# Patient Record
Sex: Female | Born: 1970 | Race: Black or African American | Hispanic: No | Marital: Married | Smoking: Never smoker
Health system: Southern US, Community
[De-identification: ages and names within clinical notes are randomized; demographics above are authoritative.]

## PROBLEM LIST (undated history)

## (undated) DIAGNOSIS — F32A Depression, unspecified: Secondary | ICD-10-CM

## (undated) DIAGNOSIS — Z973 Presence of spectacles and contact lenses: Secondary | ICD-10-CM

## (undated) DIAGNOSIS — F419 Anxiety disorder, unspecified: Secondary | ICD-10-CM

## (undated) DIAGNOSIS — F329 Major depressive disorder, single episode, unspecified: Secondary | ICD-10-CM

## (undated) DIAGNOSIS — E538 Deficiency of other specified B group vitamins: Secondary | ICD-10-CM

## (undated) DIAGNOSIS — Z8669 Personal history of other diseases of the nervous system and sense organs: Secondary | ICD-10-CM

## (undated) DIAGNOSIS — T8859XA Other complications of anesthesia, initial encounter: Secondary | ICD-10-CM

## (undated) DIAGNOSIS — T4145XA Adverse effect of unspecified anesthetic, initial encounter: Secondary | ICD-10-CM

## (undated) DIAGNOSIS — Z972 Presence of dental prosthetic device (complete) (partial): Secondary | ICD-10-CM

## (undated) DIAGNOSIS — T884XXA Failed or difficult intubation, initial encounter: Secondary | ICD-10-CM

## (undated) DIAGNOSIS — Z8489 Family history of other specified conditions: Secondary | ICD-10-CM

## (undated) DIAGNOSIS — E559 Vitamin D deficiency, unspecified: Secondary | ICD-10-CM

## (undated) DIAGNOSIS — R519 Headache, unspecified: Secondary | ICD-10-CM

## (undated) DIAGNOSIS — D649 Anemia, unspecified: Secondary | ICD-10-CM

## (undated) HISTORY — DX: Depression, unspecified: F32.A

## (undated) HISTORY — DX: Vitamin D deficiency, unspecified: E55.9

## (undated) HISTORY — DX: Anxiety disorder, unspecified: F41.9

## (undated) HISTORY — PX: BRAIN SURGERY: SHX531

## (undated) HISTORY — DX: Deficiency of other specified B group vitamins: E53.8

## (undated) HISTORY — DX: Major depressive disorder, single episode, unspecified: F32.9

## (undated) HISTORY — DX: Anemia, unspecified: D64.9

---
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
## 2000-09-20 HISTORY — PX: TUBAL LIGATION: SHX77

## 2004-11-05 DIAGNOSIS — F419 Anxiety disorder, unspecified: Secondary | ICD-10-CM | POA: Insufficient documentation

## 2008-04-18 DIAGNOSIS — G4700 Insomnia, unspecified: Secondary | ICD-10-CM | POA: Insufficient documentation

## 2009-05-05 ENCOUNTER — Ambulatory Visit: Payer: Self-pay | Admitting: Family Medicine

## 2009-05-16 DIAGNOSIS — D509 Iron deficiency anemia, unspecified: Secondary | ICD-10-CM | POA: Insufficient documentation

## 2009-09-20 DIAGNOSIS — Z8669 Personal history of other diseases of the nervous system and sense organs: Secondary | ICD-10-CM

## 2009-09-20 HISTORY — PX: BRAIN SURGERY: SHX531

## 2009-09-20 HISTORY — DX: Personal history of other diseases of the nervous system and sense organs: Z86.69

## 2009-10-13 DIAGNOSIS — E871 Hypo-osmolality and hyponatremia: Secondary | ICD-10-CM | POA: Insufficient documentation

## 2009-10-22 ENCOUNTER — Ambulatory Visit: Payer: Self-pay

## 2010-05-14 ENCOUNTER — Ambulatory Visit: Payer: Self-pay | Admitting: Family Medicine

## 2010-05-18 ENCOUNTER — Ambulatory Visit: Payer: Self-pay

## 2010-05-27 ENCOUNTER — Ambulatory Visit: Payer: Self-pay | Admitting: Family Medicine

## 2010-11-26 ENCOUNTER — Ambulatory Visit: Payer: Self-pay | Admitting: Family Medicine

## 2011-05-17 ENCOUNTER — Emergency Department: Payer: Self-pay | Admitting: Emergency Medicine

## 2011-06-10 ENCOUNTER — Ambulatory Visit: Payer: Self-pay | Admitting: Family Medicine

## 2011-07-08 ENCOUNTER — Ambulatory Visit: Payer: Self-pay | Admitting: Gastroenterology

## 2011-07-08 LAB — HM COLONOSCOPY

## 2012-07-19 DIAGNOSIS — M797 Fibromyalgia: Secondary | ICD-10-CM | POA: Insufficient documentation

## 2012-09-20 HISTORY — PX: COLONOSCOPY: SHX174

## 2013-01-16 ENCOUNTER — Ambulatory Visit: Payer: Self-pay | Admitting: Family Medicine

## 2013-08-10 LAB — HM PAP SMEAR: HM PAP: NEGATIVE

## 2013-08-30 ENCOUNTER — Ambulatory Visit: Payer: Self-pay | Admitting: Family Medicine

## 2013-09-25 DIAGNOSIS — L299 Pruritus, unspecified: Secondary | ICD-10-CM | POA: Diagnosis not present

## 2013-09-25 DIAGNOSIS — R21 Rash and other nonspecific skin eruption: Secondary | ICD-10-CM | POA: Diagnosis not present

## 2013-09-25 DIAGNOSIS — L258 Unspecified contact dermatitis due to other agents: Secondary | ICD-10-CM | POA: Diagnosis not present

## 2013-09-25 DIAGNOSIS — B359 Dermatophytosis, unspecified: Secondary | ICD-10-CM | POA: Diagnosis not present

## 2013-09-25 DIAGNOSIS — L259 Unspecified contact dermatitis, unspecified cause: Secondary | ICD-10-CM | POA: Diagnosis not present

## 2013-09-25 DIAGNOSIS — R209 Unspecified disturbances of skin sensation: Secondary | ICD-10-CM | POA: Diagnosis not present

## 2013-10-02 DIAGNOSIS — L259 Unspecified contact dermatitis, unspecified cause: Secondary | ICD-10-CM | POA: Diagnosis not present

## 2013-10-15 DIAGNOSIS — G4720 Circadian rhythm sleep disorder, unspecified type: Secondary | ICD-10-CM | POA: Diagnosis not present

## 2013-10-15 DIAGNOSIS — G43719 Chronic migraine without aura, intractable, without status migrainosus: Secondary | ICD-10-CM | POA: Diagnosis not present

## 2013-10-15 DIAGNOSIS — G935 Compression of brain: Secondary | ICD-10-CM | POA: Diagnosis not present

## 2013-10-15 DIAGNOSIS — G4700 Insomnia, unspecified: Secondary | ICD-10-CM | POA: Diagnosis not present

## 2013-10-29 DIAGNOSIS — L819 Disorder of pigmentation, unspecified: Secondary | ICD-10-CM | POA: Diagnosis not present

## 2013-10-29 DIAGNOSIS — R21 Rash and other nonspecific skin eruption: Secondary | ICD-10-CM | POA: Diagnosis not present

## 2013-10-29 DIAGNOSIS — L259 Unspecified contact dermatitis, unspecified cause: Secondary | ICD-10-CM | POA: Diagnosis not present

## 2013-10-29 DIAGNOSIS — L280 Lichen simplex chronicus: Secondary | ICD-10-CM | POA: Diagnosis not present

## 2013-11-13 DIAGNOSIS — F3289 Other specified depressive episodes: Secondary | ICD-10-CM | POA: Diagnosis not present

## 2013-11-13 DIAGNOSIS — R05 Cough: Secondary | ICD-10-CM | POA: Diagnosis not present

## 2013-11-13 DIAGNOSIS — R059 Cough, unspecified: Secondary | ICD-10-CM | POA: Diagnosis not present

## 2013-11-13 DIAGNOSIS — F329 Major depressive disorder, single episode, unspecified: Secondary | ICD-10-CM | POA: Diagnosis not present

## 2013-11-13 DIAGNOSIS — J069 Acute upper respiratory infection, unspecified: Secondary | ICD-10-CM | POA: Diagnosis not present

## 2013-11-13 DIAGNOSIS — F411 Generalized anxiety disorder: Secondary | ICD-10-CM | POA: Diagnosis not present

## 2014-01-30 DIAGNOSIS — F3289 Other specified depressive episodes: Secondary | ICD-10-CM | POA: Diagnosis not present

## 2014-01-30 DIAGNOSIS — F329 Major depressive disorder, single episode, unspecified: Secondary | ICD-10-CM | POA: Diagnosis not present

## 2014-01-30 DIAGNOSIS — Q054 Unspecified spina bifida with hydrocephalus: Secondary | ICD-10-CM | POA: Diagnosis not present

## 2014-01-30 DIAGNOSIS — R51 Headache: Secondary | ICD-10-CM | POA: Diagnosis not present

## 2014-01-30 DIAGNOSIS — IMO0001 Reserved for inherently not codable concepts without codable children: Secondary | ICD-10-CM | POA: Diagnosis not present

## 2014-04-26 DIAGNOSIS — Q223 Other congenital malformations of pulmonary valve: Secondary | ICD-10-CM | POA: Diagnosis not present

## 2014-04-26 DIAGNOSIS — F3289 Other specified depressive episodes: Secondary | ICD-10-CM | POA: Diagnosis not present

## 2014-04-26 DIAGNOSIS — R51 Headache: Secondary | ICD-10-CM | POA: Diagnosis not present

## 2014-04-26 DIAGNOSIS — F329 Major depressive disorder, single episode, unspecified: Secondary | ICD-10-CM | POA: Diagnosis not present

## 2014-04-26 DIAGNOSIS — IMO0001 Reserved for inherently not codable concepts without codable children: Secondary | ICD-10-CM | POA: Diagnosis not present

## 2014-05-21 DIAGNOSIS — G935 Compression of brain: Secondary | ICD-10-CM | POA: Diagnosis not present

## 2014-05-21 DIAGNOSIS — M542 Cervicalgia: Secondary | ICD-10-CM | POA: Diagnosis not present

## 2014-05-21 DIAGNOSIS — G243 Spasmodic torticollis: Secondary | ICD-10-CM | POA: Diagnosis not present

## 2014-05-21 DIAGNOSIS — G43719 Chronic migraine without aura, intractable, without status migrainosus: Secondary | ICD-10-CM | POA: Diagnosis not present

## 2014-07-24 DIAGNOSIS — L239 Allergic contact dermatitis, unspecified cause: Secondary | ICD-10-CM | POA: Diagnosis not present

## 2014-07-24 DIAGNOSIS — R21 Rash and other nonspecific skin eruption: Secondary | ICD-10-CM | POA: Diagnosis not present

## 2014-07-24 DIAGNOSIS — L819 Disorder of pigmentation, unspecified: Secondary | ICD-10-CM | POA: Diagnosis not present

## 2014-08-16 DIAGNOSIS — M542 Cervicalgia: Secondary | ICD-10-CM | POA: Diagnosis not present

## 2014-08-16 DIAGNOSIS — Z0000 Encounter for general adult medical examination without abnormal findings: Secondary | ICD-10-CM | POA: Diagnosis not present

## 2014-08-16 DIAGNOSIS — Z1389 Encounter for screening for other disorder: Secondary | ICD-10-CM | POA: Diagnosis not present

## 2014-08-16 DIAGNOSIS — G4730 Sleep apnea, unspecified: Secondary | ICD-10-CM | POA: Diagnosis not present

## 2014-08-20 DIAGNOSIS — M542 Cervicalgia: Secondary | ICD-10-CM | POA: Diagnosis not present

## 2014-09-02 DIAGNOSIS — M542 Cervicalgia: Secondary | ICD-10-CM | POA: Diagnosis not present

## 2014-09-03 DIAGNOSIS — H04123 Dry eye syndrome of bilateral lacrimal glands: Secondary | ICD-10-CM | POA: Diagnosis not present

## 2014-09-03 DIAGNOSIS — H10413 Chronic giant papillary conjunctivitis, bilateral: Secondary | ICD-10-CM | POA: Diagnosis not present

## 2014-09-25 ENCOUNTER — Ambulatory Visit: Payer: Self-pay | Admitting: Family Medicine

## 2014-09-25 DIAGNOSIS — Z1231 Encounter for screening mammogram for malignant neoplasm of breast: Secondary | ICD-10-CM | POA: Diagnosis not present

## 2014-09-25 LAB — HM MAMMOGRAPHY: HM Mammogram: NEGATIVE

## 2014-10-24 DIAGNOSIS — D509 Iron deficiency anemia, unspecified: Secondary | ICD-10-CM | POA: Diagnosis not present

## 2014-10-24 DIAGNOSIS — E538 Deficiency of other specified B group vitamins: Secondary | ICD-10-CM | POA: Diagnosis not present

## 2014-10-24 DIAGNOSIS — E559 Vitamin D deficiency, unspecified: Secondary | ICD-10-CM | POA: Diagnosis not present

## 2014-10-24 LAB — CBC AND DIFFERENTIAL
HCT: 38 % (ref 36–46)
HEMOGLOBIN: 11.9 g/dL — AB (ref 12.0–16.0)
Platelets: 328 10*3/uL (ref 150–399)
WBC: 5.1 10^3/mL

## 2014-10-24 LAB — HEMOGLOBIN A1C: HEMOGLOBIN A1C: 5.6 % (ref 4.0–6.0)

## 2014-12-24 DIAGNOSIS — G4723 Circadian rhythm sleep disorder, irregular sleep wake type: Secondary | ICD-10-CM | POA: Diagnosis not present

## 2014-12-24 DIAGNOSIS — G43719 Chronic migraine without aura, intractable, without status migrainosus: Secondary | ICD-10-CM | POA: Diagnosis not present

## 2014-12-24 DIAGNOSIS — G935 Compression of brain: Secondary | ICD-10-CM | POA: Diagnosis not present

## 2014-12-24 DIAGNOSIS — G4701 Insomnia due to medical condition: Secondary | ICD-10-CM | POA: Diagnosis not present

## 2015-01-09 ENCOUNTER — Ambulatory Visit
Admit: 2015-01-09 | Disposition: A | Discharge status: Home / Self Care | Payer: Self-pay | Attending: Family Medicine | Admitting: Family Medicine

## 2015-01-09 DIAGNOSIS — M797 Fibromyalgia: Secondary | ICD-10-CM | POA: Diagnosis not present

## 2015-01-09 DIAGNOSIS — J329 Chronic sinusitis, unspecified: Secondary | ICD-10-CM | POA: Diagnosis not present

## 2015-01-09 DIAGNOSIS — Z79899 Other long term (current) drug therapy: Secondary | ICD-10-CM | POA: Diagnosis not present

## 2015-01-09 DIAGNOSIS — J069 Acute upper respiratory infection, unspecified: Secondary | ICD-10-CM | POA: Diagnosis not present

## 2015-01-09 LAB — RAPID INFLUENZA A&B ANTIGENS

## 2015-01-13 DIAGNOSIS — Z1389 Encounter for screening for other disorder: Secondary | ICD-10-CM | POA: Diagnosis not present

## 2015-01-13 DIAGNOSIS — R05 Cough: Secondary | ICD-10-CM | POA: Diagnosis not present

## 2015-01-13 DIAGNOSIS — J309 Allergic rhinitis, unspecified: Secondary | ICD-10-CM | POA: Diagnosis not present

## 2015-01-13 DIAGNOSIS — J40 Bronchitis, not specified as acute or chronic: Secondary | ICD-10-CM | POA: Diagnosis not present

## 2015-05-08 ENCOUNTER — Other Ambulatory Visit: Payer: Self-pay | Admitting: *Deleted

## 2015-05-13 ENCOUNTER — Other Ambulatory Visit: Payer: Self-pay

## 2015-05-13 DIAGNOSIS — F329 Major depressive disorder, single episode, unspecified: Secondary | ICD-10-CM | POA: Insufficient documentation

## 2015-05-13 DIAGNOSIS — F32A Depression, unspecified: Secondary | ICD-10-CM

## 2015-05-20 ENCOUNTER — Other Ambulatory Visit: Payer: Self-pay

## 2015-05-20 DIAGNOSIS — M791 Myalgia, unspecified site: Secondary | ICD-10-CM | POA: Insufficient documentation

## 2015-05-20 DIAGNOSIS — Q0700 Arnold-Chiari syndrome without spina bifida or hydrocephalus: Secondary | ICD-10-CM | POA: Insufficient documentation

## 2015-05-20 DIAGNOSIS — G4730 Sleep apnea, unspecified: Secondary | ICD-10-CM | POA: Insufficient documentation

## 2015-05-20 DIAGNOSIS — A6920 Lyme disease, unspecified: Secondary | ICD-10-CM | POA: Insufficient documentation

## 2015-05-20 DIAGNOSIS — R519 Headache, unspecified: Secondary | ICD-10-CM | POA: Insufficient documentation

## 2015-05-20 DIAGNOSIS — R51 Headache: Secondary | ICD-10-CM

## 2015-05-20 DIAGNOSIS — L309 Dermatitis, unspecified: Secondary | ICD-10-CM | POA: Insufficient documentation

## 2015-05-20 DIAGNOSIS — J309 Allergic rhinitis, unspecified: Secondary | ICD-10-CM | POA: Insufficient documentation

## 2015-05-20 DIAGNOSIS — E538 Deficiency of other specified B group vitamins: Secondary | ICD-10-CM | POA: Insufficient documentation

## 2015-05-20 DIAGNOSIS — F32A Depression, unspecified: Secondary | ICD-10-CM

## 2015-05-20 DIAGNOSIS — G8929 Other chronic pain: Secondary | ICD-10-CM | POA: Insufficient documentation

## 2015-05-20 DIAGNOSIS — E559 Vitamin D deficiency, unspecified: Secondary | ICD-10-CM | POA: Insufficient documentation

## 2015-05-20 DIAGNOSIS — F329 Major depressive disorder, single episode, unspecified: Secondary | ICD-10-CM | POA: Insufficient documentation

## 2015-05-20 MED ORDER — CITALOPRAM HYDROBROMIDE 40 MG PO TABS: 40.0000 mg | ORAL_TABLET | Freq: Every day | ORAL | Status: DC

## 2015-06-29 ENCOUNTER — Encounter: Payer: Self-pay | Admitting: *Deleted

## 2015-06-29 ENCOUNTER — Ambulatory Visit
Admission: EM | Admit: 2015-06-29 | Discharge: 2015-06-29 | Disposition: A | Discharge status: Home / Self Care | Payer: Federal, State, Local not specified - PPO | Attending: Family Medicine | Admitting: Family Medicine

## 2015-06-29 ENCOUNTER — Ambulatory Visit: Payer: Federal, State, Local not specified - PPO

## 2015-06-29 DIAGNOSIS — S93401A Sprain of unspecified ligament of right ankle, initial encounter: Secondary | ICD-10-CM | POA: Diagnosis not present

## 2015-06-29 DIAGNOSIS — S0081XA Abrasion of other part of head, initial encounter: Secondary | ICD-10-CM | POA: Diagnosis not present

## 2015-06-29 DIAGNOSIS — M7989 Other specified soft tissue disorders: Secondary | ICD-10-CM | POA: Diagnosis not present

## 2015-06-29 MED ORDER — IBUPROFEN 600 MG PO TABS: 600.0000 mg | ORAL_TABLET | Freq: Four times a day (QID) | ORAL | Status: DC | PRN

## 2015-06-29 NOTE — Discharge Instructions (Signed)
Apply a compressive ACE bandage. Rest and elevate the affected painful area.  Apply cold compresses intermittently as needed for 44min at least 4 times daily.  As pain recedes, begin normal activities slowly as tolerated.   Use bactroban or neosporin as needed for scratch on face.  Keep clean & dry with soap & water. Ankle Sprain An ankle sprain is an injury to the strong, fibrous tissues (ligaments) that hold the bones of your ankle joint together.  CAUSES An ankle sprain is usually caused by a fall or by twisting your ankle. Ankle sprains most commonly occur when you step on the outer edge of your foot, and your ankle turns inward. People who participate in sports are more prone to these types of injuries.  SYMPTOMS   Pain in your ankle. The pain may be present at rest or only when you are trying to stand or walk.  Swelling.  Bruising. Bruising may develop immediately or within 1 to 2 days after your injury.  Difficulty standing or walking, particularly when turning corners or changing directions. DIAGNOSIS  Your caregiver will ask you details about your injury and perform a physical exam of your ankle to determine if you have an ankle sprain. During the physical exam, your caregiver will press on and apply pressure to specific areas of your foot and ankle. Your caregiver will try to move your ankle in certain ways. An X-ray exam may be done to be sure a bone was not broken or a ligament did not separate from one of the bones in your ankle (avulsion fracture).  TREATMENT  Certain types of braces can help stabilize your ankle. Your caregiver can make a recommendation for this. Your caregiver may recommend the use of medicine for pain. If your sprain is severe, your caregiver may refer you to a surgeon who helps to restore function to parts of your skeletal system (orthopedist) or a physical therapist. [place] ice to your injury for 1-2 days or as directed by your  caregiver. Applying ice helps to reduce inflammation and pain.  Put ice in a plastic bag.  Place a towel between your skin and the bag.  Leave the ice on for 15-20 minutes at a time, every 2 hours while you are awake.  Only take over-the-counter or prescription medicines for pain, discomfort, or fever as directed by your caregiver.  Elevate your injured ankle above the level of your heart as much as possible for 2-3 days.  If your caregiver recommends crutches, use them as instructed. Gradually put weight on the affected ankle. Continue to use crutches or a cane until you can walk without feeling pain in your ankle.  If you have a plaster splint, wear the splint as directed by your caregiver. Do not rest it on anything harder than a pillow for the first 24 hours. Do not put weight on it. Do not get it wet. You may take it off to take a shower or bath.  You may have been given an elastic bandage to wear around your ankle to provide support. If the elastic bandage is too tight (you have numbness or tingling in your foot or your foot becomes cold and blue), adjust the bandage to make it comfortable.  If you have an air splint, you may blow more air into it or let air out to make it more comfortable. You may take your splint off at night and before taking a shower or bath. Wiggle your  toes in the splint several times per day to decrease swelling. SEEK MEDICAL CARE IF:   You have rapidly increasing bruising or swelling.  Your toes feel extremely cold or you lose feeling in your foot.  Your pain is not relieved with medicine. SEEK IMMEDIATE MEDICAL CARE IF:  Your toes are numb or blue.  You have severe pain that is increasing. MAKE SURE YOU:   Understand these instructions.  Will watch your condition.  Will get help right away if you are not doing well or get worse.   This information is not intended to replace advice given to you by your health care provider. Make sure you discuss  any questions you have with your health care provider.   Document Released: 09/06/2005 Document Revised: 09/27/2014 Document Reviewed: 09/18/2011 Elsevier Interactive Patient Education 2016 [employer]  Abrasion An abrasion is a cut or scrape on the outer surface of your skin. An abrasion does not extend through all of the layers of your skin. It is important to care for your abrasion properly to prevent infection. CAUSES Most abrasions are caused by falling on or gliding across the ground or another surface. When your skin rubs on something, the outer and inner layer of skin rubs off.  SYMPTOMS A cut or scrape is the main symptom of this condition. The scrape may be bleeding, or it may appear red or pink. If there was an associated fall, there may be an underlying bruise. DIAGNOSIS An abrasion is diagnosed with a physical exam. TREATMENT Treatment for this condition depends on how large and deep the abrasion is. Usually, your abrasion will be cleaned with water and mild soap. This removes any dirt or debris that may be stuck. An antibiotic ointment may be applied to the abrasion to help prevent infection. A bandage (dressing) may be placed on the abrasion to keep it clean. You may also need a tetanus shot. HOME CARE INSTRUCTIONS Medicines  Take or apply medicines only as directed by your health care provider.  If you were prescribed an antibiotic ointment, finish all of it even if you start to feel better. Wound Care  Clean the wound with mild soap and water 2-3 times per day or as directed by your health care provider. Pat your wound dry with a clean towel. Do not rub it.  There are many different ways to close and cover a wound. Follow instructions from your health care provider about:  Wound care.  Dressing changes and removal.  Check your wound every day for signs of infection. Watch for:  Redness, swelling, or pain.  Fluid, blood, or pus. General Instructions  Keep the  dressing dry as directed by your health care provider. Do not take baths, swim, use a hot tub, or do anything that would put your wound underwater until your health care provider approves.  If there is swelling, raise (elevate) the injured area above the level of your heart while you are sitting or lying down.  Keep all follow-up visits as directed by your health care provider. This is important. SEEK MEDICAL CARE IF:  You received a tetanus shot and you have swelling, severe pain, redness, or bleeding at the injection site.  Your pain is not controlled with medicine.  You have increased redness, swelling, or pain at the site of your wound. SEEK IMMEDIATE MEDICAL CARE IF:  You have a red streak going away from your wound.  You have a fever.  You have fluid, blood, or pus  coming from your wound.  You notice a bad smell coming from your wound or your dressing.   This information is not intended to replace advice given to you by your health care provider. Make sure you discuss any questions you have with your health care provider.   Document Released: 06/16/2005 Document Revised: 05/28/2015 Document Reviewed: 09/04/2014 Elsevier Interactive Patient Education [employer].
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
## 2015-06-29 NOTE — ED Notes (Signed)
Pt states that she fell and slipped injuring her right ankle.

## 2015-07-25 DIAGNOSIS — L249 Irritant contact dermatitis, unspecified cause: Secondary | ICD-10-CM | POA: Diagnosis not present

## 2015-07-25 DIAGNOSIS — R21 Rash and other nonspecific skin eruption: Secondary | ICD-10-CM | POA: Diagnosis not present

## 2015-09-01 ENCOUNTER — Encounter: Payer: Self-pay | Admitting: Physician Assistant

## 2015-09-04 ENCOUNTER — Other Ambulatory Visit: Payer: Self-pay | Admitting: Family Medicine

## 2015-09-04 DIAGNOSIS — F419 Anxiety disorder, unspecified: Secondary | ICD-10-CM

## 2015-09-04 NOTE — Telephone Encounter (Signed)
Printed, please fax or call in to pharmacy. Thank you.   

## 2015-09-12 ENCOUNTER — Encounter: Payer: Self-pay | Admitting: Physician Assistant

## 2015-09-19 ENCOUNTER — Encounter: Payer: Self-pay | Admitting: Physician Assistant

## 2015-10-13 ENCOUNTER — Encounter: Payer: Self-pay | Admitting: Physician Assistant

## 2015-11-04 ENCOUNTER — Ambulatory Visit (INDEPENDENT_AMBULATORY_CARE_PROVIDER_SITE_OTHER): Payer: Federal, State, Local not specified - PPO | Admitting: Physician Assistant

## 2015-11-04 ENCOUNTER — Encounter: Payer: Self-pay | Admitting: Physician Assistant

## 2015-11-04 VITALS — BP 102/64 | HR 88 | Temp 98.3°F | Resp 16 | Ht 65.0 in | Wt 204.0 lb

## 2015-11-04 DIAGNOSIS — Z13220 Encounter for screening for lipoid disorders: Secondary | ICD-10-CM | POA: Diagnosis not present

## 2015-11-04 DIAGNOSIS — F419 Anxiety disorder, unspecified: Secondary | ICD-10-CM

## 2015-11-04 DIAGNOSIS — Z1239 Encounter for other screening for malignant neoplasm of breast: Secondary | ICD-10-CM

## 2015-11-04 DIAGNOSIS — Z136 Encounter for screening for cardiovascular disorders: Secondary | ICD-10-CM | POA: Diagnosis not present

## 2015-11-04 DIAGNOSIS — Z124 Encounter for screening for malignant neoplasm of cervix: Secondary | ICD-10-CM | POA: Diagnosis not present

## 2015-11-04 DIAGNOSIS — Z0000 Encounter for general adult medical examination without abnormal findings: Secondary | ICD-10-CM | POA: Diagnosis not present

## 2015-11-04 DIAGNOSIS — R7309 Other abnormal glucose: Secondary | ICD-10-CM

## 2015-11-04 DIAGNOSIS — F329 Major depressive disorder, single episode, unspecified: Secondary | ICD-10-CM | POA: Diagnosis not present

## 2015-11-04 DIAGNOSIS — Z1211 Encounter for screening for malignant neoplasm of colon: Secondary | ICD-10-CM | POA: Diagnosis not present

## 2015-11-04 DIAGNOSIS — E559 Vitamin D deficiency, unspecified: Secondary | ICD-10-CM | POA: Diagnosis not present

## 2015-11-04 DIAGNOSIS — F32A Depression, unspecified: Secondary | ICD-10-CM

## 2015-11-04 MED ORDER — ALPRAZOLAM 0.5 MG PO TABS: ORAL_TABLET | ORAL | Status: DC

## 2015-11-04 MED ORDER — CITALOPRAM HYDROBROMIDE 40 MG PO TABS: 40.0000 mg | ORAL_TABLET | Freq: Every day | ORAL | Status: DC

## 2015-11-04 MED ORDER — VITAMIN D (ERGOCALCIFEROL) 1.25 MG (50000 UNIT) PO CAPS: 50000.0000 [IU] | ORAL_CAPSULE | ORAL | Status: DC

## 2015-11-04 NOTE — Progress Notes (Signed)
Patient ID: Virginia Woods, female   DOB: 12/12/1970, 45 y.o.   MRN: [id number]       Patient: Virginia Woods, Female    DOB: 1971-01-20, 45 y.o.   MRN: [id number] Visit Date: 11/04/2015  Today's Provider: Mar Daring, PA-C   Chief Complaint  Patient presents with  . Annual Exam   Subjective:    Annual physical exam Virginia Woods is a 45 y.o. female who presents today for health maintenance and complete physical. She feels fairly well. She reports exercising none. She reports she is sleeping fairly well.  08/16/14 CPE 08/10/13 Pap-neg 09/25/14 Mammo-BI-RADS 1 07/08/11 Colon-diverticulosis  Lab Results  Component Value Date   WBC 5.1 10/24/2014   HGB 11.9* 10/24/2014   HCT 38 10/24/2014   PLT 328 10/24/2014   HGBA1C 5.6 10/24/2014   -----------------------------------------------------------------   Review of Systems  Constitutional: Positive for fatigue.  HENT: Positive for congestion and sinus pressure.   Eyes: Positive for photophobia.  Respiratory: Negative.   Cardiovascular: Positive for palpitations.  Gastrointestinal: Negative.   Endocrine: Negative.   Genitourinary: Negative.   Musculoskeletal: Positive for myalgias, back pain, arthralgias, neck pain and neck stiffness.  Skin: Negative.   Allergic/Immunologic: Positive for environmental allergies.  Neurological: Positive for weakness, numbness and headaches.  Hematological: Negative.   Psychiatric/Behavioral: The patient is nervous/anxious.     Social History      She  reports that she has never smoked. She has never used smokeless tobacco. She reports that she does not drink alcohol or use illicit drugs.       Social History   Social History  . Marital Status: Married    Spouse Name: N/A  . Number of Children: N/A  . Years of Education: N/A   Social History Main Topics  . Smoking status: Never Smoker   . Smokeless tobacco: Never Used  . Alcohol Use: No  . Drug Use: No    . Sexual Activity: Not Asked   Other Topics Concern  . None   Social History Narrative    Past Medical History  Diagnosis Date  . Anemia   . Anxiety   . Depression   . Vitamin D deficiency   . Vitamin B12 deficiency      Patient Active Problem List   Diagnosis Date Noted  . Allergic rhinitis 05/20/2015  . 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 Chronic headache 05/20/2015  . Chronic pain 05/20/2015  . Clinical depression 05/20/2015  . Dermatitis, eczematoid 05/20/2015  . Acute Lyme disease 05/20/2015  . Muscle ache 05/20/2015  . Apnea, sleep 05/20/2015  . B12 deficiency 05/20/2015  . Avitaminosis D 05/20/2015  . Depression 05/13/2015  . Hypo-osmolality and hyponatremia 10/13/2009  . Anemia, iron deficiency 05/16/2009  . Cannot sleep 04/18/2008  . Anxiety 11/05/2004    Past Surgical History  Procedure Laterality Date  . Brain surgery    . Cesarean section  1996  . Tubal ligation  2002    Family History        Family Status  Relation Status Death Age  . Mother Alive   . Father Alive     Kidney failure, kidney transplant  . Sister Alive   . Paternal Aunt Alive     Brain aneurysm  . Maternal Grandmother Deceased     MI  . Paternal Grandmother Deceased     Brain aneurysm        Her family history includes Cancer in her mother; Heart disease  in her maternal grandmother; Hypertension in her mother and sister.    No Known Allergies  Previous Medications   ALBUTEROL (PROVENTIL HFA) 108 (90 BASE) MCG/ACT INHALER    Inhale into the lungs.   ALPRAZOLAM (XANAX) 0.5 MG TABLET    TAKE ONE TABLET BY MOUTH TWICE DAILY AS NEEDED.   CITALOPRAM (CELEXA) 40 MG TABLET    Take 1 tablet (40 mg total) by mouth daily.   CYCLOBENZAPRINE (FLEXERIL) 10 MG TABLET    Take 10 mg by mouth 3 (three) times daily as needed for muscle spasms.   FLUTICASONE (FLONASE) 50 MCG/ACT NASAL SPRAY    Place 2 sprays into the nose as needed.    GABAPENTIN (NEURONTIN) 300 MG CAPSULE     Take 300 mg by mouth 2 (two) times daily.    IBUPROFEN (ADVIL,MOTRIN) 600 MG TABLET    Take 1 tablet (600 mg total) by mouth every 6 (six) hours as needed.   INDOMETHACIN ER PO    Take by mouth as needed.    LORATADINE (CLARITIN) 10 MG TABLET    Take 10 mg by mouth daily as needed.    OMEGA-3 FATTY ACIDS PO       VITAMIN D, ERGOCALCIFEROL, (DRISDOL) 50000 UNITS CAPS CAPSULE    Take 50,000 Units by mouth every 7 (seven) days.     Patient Care Team: Mar Daring, PA-C as PCP - General (Family Medicine)     Objective:   Vitals: BP 102/64 mmHg  Pulse 88  Temp(Src) 98.3 F (36.8 C) (Oral)  Resp 16  Ht 5\' 5"  (1.651 m)  Wt 204 lb (92.534 kg)  BMI 33.95 kg/m2  LMP 10/13/2015   Physical Exam  Constitutional: She is oriented to person, place, and time. She appears well-developed and well-nourished. No distress.  HENT:  Head: Normocephalic and atraumatic.  Right Ear: Hearing, tympanic membrane, external ear and ear canal normal.  Left Ear: Hearing, tympanic membrane, external ear and ear canal normal.  Nose: Nose normal.  Mouth/Throat: Uvula is midline, oropharynx is clear and moist and mucous membranes are normal. No oropharyngeal exudate.  Eyes: Conjunctivae, EOM and lids are normal. Pupils are equal, round, and reactive to light. Right eye exhibits no discharge. Left eye exhibits no discharge. No scleral icterus.  Neck: Trachea normal and normal range of motion. Neck supple. No JVD present. Carotid bruit is not present. No tracheal deviation present. No thyroid mass and no thyromegaly present.  Cardiovascular: Normal rate, regular rhythm, normal heart sounds and intact distal pulses.  Exam reveals no gallop and no friction rub.   No murmur heard. Pulmonary/Chest: Effort normal and breath sounds normal. No respiratory distress. She has no wheezes. She has no rales. She exhibits no tenderness. Right breast exhibits no inverted nipple, no mass, no nipple discharge, no skin change and  no tenderness. Left breast exhibits no inverted nipple, no mass, no nipple discharge, no skin change and no tenderness. Breasts are symmetrical.  Abdominal: Soft. Normal appearance and bowel sounds are normal. She exhibits no distension and no mass. There is no hepatosplenomegaly. There is no tenderness. There is no rebound and no guarding. Hernia confirmed negative in the right inguinal area and confirmed negative in the left inguinal area.  Genitourinary: Rectum normal, vagina normal and uterus normal. No breast swelling, tenderness, discharge or bleeding. Pelvic exam was performed with patient supine. There is no rash, tenderness, lesion or injury on the right labia. There is no rash, tenderness, lesion or injury on  the left labia. Cervix exhibits no motion tenderness, no discharge and no friability. Right adnexum displays no mass, no tenderness and no fullness. Left adnexum displays no mass, no tenderness and no fullness. No erythema, tenderness or bleeding in the vagina. No signs of injury around the vagina. No vaginal discharge found.  Musculoskeletal: Normal range of motion. She exhibits no edema or tenderness.  Lymphadenopathy:    She has no cervical adenopathy.    She has no axillary adenopathy.       Right: No inguinal adenopathy present.       Left: No inguinal adenopathy present.  Neurological: She is alert and oriented to person, place, and time. She has normal strength and normal reflexes. No cranial nerve deficit. Coordination normal.  Skin: Skin is warm, dry and intact. No rash noted. She is not diaphoretic.  Psychiatric: She has a normal mood and affect. Her speech is normal and behavior is normal. Judgment and thought content normal. Cognition and memory are normal.  Vitals reviewed.    Depression Screen PHQ 2/9 Scores 11/04/2015  Exception Documentation Patient refusal      Assessment & Plan:     Routine Health Maintenance and Physical Exam  1. Annual physical  exam Physical exam was normal today. I will check labs as below and follow-up with her pending lab results. If labs are stable and within normal limits she would not need to be seen or have labs retested for one year at her next repeat annual physical exam. She may call the office if she develops any acute issues, questions or concerns in the meantime. - CBC with Differential - Comprehensive metabolic panel - TSH  2. Breast cancer screening Breast exam today was normal. There is no family history of breast cancer. She does perform self breast exams regularly. She is due for her mammogram. Mammogram was ordered as below. Information for [facility] breast clinic was given to patient so that she may call and schedule her mammogram at her convenience. - Mammogram Digital Screening; Future  3. Colon cancer screening It is time for her 5 year repeat colonoscopy. Referral to gastroenterology was made below for colonoscopy. - Ambulatory referral to Gastroenterology  4. Cervical cancer screening Pap smear was collected today. I will follow-up with her pending these results. - Pap IG w/ reflex to HPV when [facility] ([facility])  5. Elevated hemoglobin A1c History of borderline elevated hemoglobin A1c. We'll recheck hemoglobin A1c as below and follow-up pending these results. - Hemoglobin A1c  6. Encounter for lipid screening for cardiovascular disease Family history of elevated cholesterol. Will check cholesterol as below and follow-up with her pending these results. - Lipid panel  7. Depression Fairly stable. Diagnosis was pulled to refill medication below. Continue current medical treatment plan. She is to call the office if symptoms worsen. - citalopram (CELEXA) 40 MG tablet; Take 1 tablet (40 mg total) by mouth daily.  Dispense: 90 tablet; Refill: 3  8. Avitaminosis D Stable. Diagnoses pulled for medication refill. Continue current medical treatment plan. - Vitamin D, Ergocalciferol,  (DRISDOL) 50000 units CAPS capsule; Take 1 capsule (50,000 Units total) by mouth every 7 (seven) days.  Dispense: 4 capsule; Refill: 6  9. Anxiety Stable. Diagnoses pulled for medication refill. Continue current medical treatment plan. - ALPRAZolam (XANAX) 0.5 MG tablet; TAKE ONE TABLET BY MOUTH TWICE DAILY AS NEEDED.  Dispense: 40 tablet; Refill: 1   Exercise Activities and Dietary recommendations Goals    None  Immunization History  Administered Date(s) Administered  . Tdap 06/08/2010      --------------------------------------------------------------------

## 2015-11-04 NOTE — Patient Instructions (Signed)
Health Maintenance, Female Adopting a healthy lifestyle and getting preventive care can go a long way to promote health and wellness. Talk with your health care provider about what schedule of regular examinations is right for you. This is a good chance for you to check in with your provider about disease prevention and staying healthy. In between checkups, there are plenty of things you can do on your own. Experts have done a lot of research about which lifestyle changes and preventive measures are most likely to keep you healthy. Ask your health care provider for more information. WEIGHT AND DIET  Eat a healthy diet  Be sure to include plenty of vegetables, fruits, low-fat dairy products, and lean protein.  Do not eat a lot of foods high in solid fats, added sugars, or salt.  Get regular exercise. This is one of the most important things you can do for your health.  Most adults should exercise for at least 150 minutes each week. The exercise should increase your heart rate and make you sweat (moderate-intensity exercise).  Most adults should also do strengthening exercises at least twice a week. This is in addition to the moderate-intensity exercise.  Maintain a healthy weight  Body mass index (BMI) is a measurement that can be used to identify possible weight problems. It estimates body fat based on height and weight. Your health care provider can help determine your BMI and help you achieve or maintain a healthy weight.  For females 28 years of age and older:   A BMI below 18.5 is considered underweight.  A BMI of 18.5 to 24.9 is normal.  A BMI of 25 to 29.9 is considered overweight.  A BMI of 30 and above is considered obese.  Watch levels of cholesterol and blood lipids  You should start having your blood tested for lipids and cholesterol at 46 years of age, then have this test every 5 years.  You may need to have your cholesterol levels checked more often if:  Your lipid  or cholesterol levels are high.  You are older than 46 years of age.  You are at high risk for heart disease.  CANCER SCREENING   Lung Cancer  Lung cancer screening is recommended for adults 75-66 years old who are at high risk for lung cancer because of a history of smoking.  A yearly low-dose CT scan of the lungs is recommended for people who:  Currently smoke.  Have quit within the past 15 years.  Have at least a 30-pack-year history of smoking. A pack year is smoking an average of one pack of cigarettes a day for 1 year.  Yearly screening should continue until it has been 15 years since you quit.  Yearly screening should stop if you develop a health problem that would prevent you from having lung cancer treatment.  Breast Cancer  Practice breast self-awareness. This means understanding how your breasts normally appear and feel.  It also means doing regular breast self-exams. Let your health care provider know about any changes, no matter how small.  If you are in your 20s or 30s, you should have a clinical breast exam (CBE) by a health care provider every 1-3 years as part of a regular health exam.  If you are 25 or older, have a CBE every year. Also consider having a breast X-ray (mammogram) every year.  If you have a family history of breast cancer, talk to your health care provider about genetic screening.  If you  are at high risk for breast cancer, talk to your health care provider about having an MRI and a mammogram every year.  Breast cancer gene (BRCA) assessment is recommended for women who have family members with BRCA-related cancers. BRCA-related cancers include:  Breast.  Ovarian.  Tubal.  Peritoneal cancers.  Results of the assessment will determine the need for genetic counseling and BRCA1 and BRCA2 testing. Cervical Cancer Your health care provider may recommend that you be screened regularly for cancer of the pelvic organs (ovaries, uterus, and  vagina). This screening involves a pelvic examination, including checking for microscopic changes to the surface of your cervix (Pap test). You may be encouraged to have this screening done every 3 years, beginning at age 21.  For women ages 30-65, health care providers may recommend pelvic exams and Pap testing every 3 years, or they may recommend the Pap and pelvic exam, combined with testing for human papilloma virus (HPV), every 5 years. Some types of HPV increase your risk of cervical cancer. Testing for HPV may also be done on women of any age with unclear Pap test results.  Other health care providers may not recommend any screening for nonpregnant women who are considered low risk for pelvic cancer and who do not have symptoms. Ask your health care provider if a screening pelvic exam is right for you.  If you have had past treatment for cervical cancer or a condition that could lead to cancer, you need Pap tests and screening for cancer for at least 20 years after your treatment. If Pap tests have been discontinued, your risk factors (such as having a new sexual partner) need to be reassessed to determine if screening should resume. Some women have medical problems that increase the chance of getting cervical cancer. In these cases, your health care provider may recommend more frequent screening and Pap tests. Colorectal Cancer  This type of cancer can be detected and often prevented.  Routine colorectal cancer screening usually begins at 46 years of age and continues through 46 years of age.  Your health care provider may recommend screening at an earlier age if you have risk factors for colon cancer.  Your health care provider may also recommend using home test kits to check for hidden blood in the stool.  A small camera at the end of a tube can be used to examine your colon directly (sigmoidoscopy or colonoscopy). This is done to check for the earliest forms of colorectal  cancer.  Routine screening usually begins at age 50.  Direct examination of the colon should be repeated every 5-10 years through 46 years of age. However, you may need to be screened more often if early forms of precancerous polyps or small growths are found. Skin Cancer  Check your skin from head to toe regularly.  Tell your health care provider about any new moles or changes in moles, especially if there is a change in a mole's shape or color.  Also tell your health care provider if you have a mole that is larger than the size of a pencil eraser.  Always use sunscreen. Apply sunscreen liberally and repeatedly throughout the day.  Protect yourself by wearing long sleeves, pants, a wide-brimmed hat, and sunglasses whenever you are outside. HEART DISEASE, DIABETES, AND HIGH BLOOD PRESSURE   High blood pressure causes heart disease and increases the risk of stroke. High blood pressure is more likely to develop in:  People who have blood pressure in the high end   of the normal range (130-139/85-89 mm Hg).  People who are overweight or obese.  People who are African American.  If you are 38-23 years of age, have your blood pressure checked every 3-5 years. If you are 61 years of age or older, have your blood pressure checked every year. You should have your blood pressure measured twice--once when you are at a hospital or clinic, and once when you are not at a hospital or clinic. Record the average of the two measurements. To check your blood pressure when you are not at a hospital or clinic, you can use:  An automated blood pressure machine at a pharmacy.  A home blood pressure monitor.  If you are between 45 years and 39 years old, ask your health care provider if you should take aspirin to prevent strokes.  Have regular diabetes screenings. This involves taking a blood sample to check your fasting blood sugar level.  If you are at a normal weight and have a low risk for diabetes,  have this test once every three years after 46 years of age.  If you are overweight and have a high risk for diabetes, consider being tested at a younger age or more often. PREVENTING INFECTION  Hepatitis B  If you have a higher risk for hepatitis B, you should be screened for this virus. You are considered at high risk for hepatitis B if:  You were born in a country where hepatitis B is common. Ask your health care provider which countries are considered high risk.  Your parents were born in a high-risk country, and you have not been immunized against hepatitis B (hepatitis B vaccine).  You have HIV or AIDS.  You use needles to inject street drugs.  You live with someone who has hepatitis B.  You have had sex with someone who has hepatitis B.  You get hemodialysis treatment.  You take certain medicines for conditions, including cancer, organ transplantation, and autoimmune conditions. Hepatitis C  Blood testing is recommended for:  Everyone born from [birth year] through 1966.  Anyone with known risk factors for hepatitis C. Sexually transmitted infections (STIs)  You should be screened for sexually transmitted infections (STIs) including gonorrhea and chlamydia if:  You are sexually active and are younger than 46 years of age.  You are older than 46 years of age and your health care provider tells you that you are at risk for this type of infection.  Your sexual activity has changed since you were last screened and you are at an increased risk for chlamydia or gonorrhea. Ask your health care provider if you are at risk.  If you do not have HIV, but are at risk, it may be recommended that you take a prescription medicine daily to prevent HIV infection. This is called pre-exposure prophylaxis (PrEP). You are considered at risk if:  You are sexually active and do not regularly use condoms or know the HIV status of your partner(s).  You take drugs by injection.  You are sexually  active with a partner who has HIV. Talk with your health care provider about whether you are at high risk of being infected with HIV. If you choose to begin PrEP, you should first be tested for HIV. You should then be tested every 3 months for as long as you are taking PrEP.  PREGNANCY   If you are premenopausal and you may become pregnant, ask your health care provider about preconception counseling.  If you may  become pregnant, take 400 to 800 micrograms (mcg) of folic acid every day.  If you want to prevent pregnancy, talk to your health care provider about birth control (contraception). OSTEOPOROSIS AND MENOPAUSE   Osteoporosis is a disease in which the bones lose minerals and strength with aging. This can result in serious bone fractures. Your risk for osteoporosis can be identified using a bone density scan.  If you are 61 years of age or older, or if you are at risk for osteoporosis and fractures, ask your health care provider if you should be screened.  Ask your health care provider whether you should take a calcium or vitamin D supplement to lower your risk for osteoporosis.  Menopause may have certain physical symptoms and risks.  Hormone replacement therapy may reduce some of these symptoms and risks. Talk to your health care provider about whether hormone replacement therapy is right for you.  HOME CARE INSTRUCTIONS   Schedule regular health, dental, and eye exams.  Stay current with your immunizations.   Do not use any tobacco products including cigarettes, chewing tobacco, or electronic cigarettes.  If you are pregnant, do not drink alcohol.  If you are breastfeeding, limit how much and how often you drink alcohol.  Limit alcohol intake to no more than 1 drink per day for nonpregnant women. One drink equals 12 ounces of beer, 5 ounces of wine, or 1 ounces of hard liquor.  Do not use street drugs.  Do not share needles.  Ask your health care provider for help if  you need support or information about quitting drugs.  Tell your health care provider if you often feel depressed.  Tell your health care provider if you have ever been abused or do not feel safe at home.   This information is not intended to replace advice given to you by your health care provider. Make sure you discuss any questions you have with your health care provider.   Document Released: 03/22/2011 Document Revised: 09/27/2014 Document Reviewed: 08/08/2013 Elsevier Interactive Patient Education [employer].

## 2015-11-07 ENCOUNTER — Telehealth: Payer: Self-pay

## 2015-11-07 LAB — PAP IG W/ RFLX HPV ASCU: PAP SMEAR COMMENT: 0

## 2015-11-07 NOTE — Telephone Encounter (Signed)
No Answer  Thanks,  -Alisa Stjames

## 2015-11-07 NOTE — Telephone Encounter (Signed)
-----   Message from Mar Daring, PA-C sent at 11/07/2015  8:16 AM EST ----- Pap is normal.  Will repeat in 3 years.

## 2015-11-10 NOTE — Telephone Encounter (Signed)
Patient advised as directed below.  Thanks,  -Joseline 

## 2015-11-12 ENCOUNTER — Ambulatory Visit (INDEPENDENT_AMBULATORY_CARE_PROVIDER_SITE_OTHER): Payer: Federal, State, Local not specified - PPO | Admitting: Physician Assistant

## 2015-11-12 ENCOUNTER — Encounter: Payer: Self-pay | Admitting: Physician Assistant

## 2015-11-12 VITALS — BP 98/60 | HR 84 | Temp 98.9°F | Resp 16 | Wt 202.2 lb

## 2015-11-12 DIAGNOSIS — J029 Acute pharyngitis, unspecified: Secondary | ICD-10-CM

## 2015-11-12 DIAGNOSIS — J069 Acute upper respiratory infection, unspecified: Secondary | ICD-10-CM

## 2015-11-12 DIAGNOSIS — R509 Fever, unspecified: Secondary | ICD-10-CM | POA: Diagnosis not present

## 2015-11-12 LAB — POCT RAPID STREP A (OFFICE): RAPID STREP A SCREEN: NEGATIVE

## 2015-11-12 LAB — POCT INFLUENZA A/B
INFLUENZA B, POC: NEGATIVE
Influenza A, POC: NEGATIVE

## 2015-11-12 MED ORDER — AMOXICILLIN-POT CLAVULANATE 875-125 MG PO TABS: 1.0000 | ORAL_TABLET | Freq: Two times a day (BID) | ORAL | Status: DC

## 2015-11-12 NOTE — Progress Notes (Signed)
Patient: Virginia Woods Female    DOB: 26-Mar-1971   45 y.o.   MRN: [id number] Visit Date: 11/12/2015  Today's Provider: Mar Daring, PA-C   Chief Complaint  Patient presents with  . Fever   Subjective:    Fever  This is a new problem. The current episode started in the past 7 days (Fever strted on the weekend). The maximum temperature noted was 99 to 99.9 F (Last time patient noticed a low grade fever was Sunday.). The temperature was taken using an oral thermometer. Associated symptoms include chest pain (with coughing), congestion, coughing, headaches, muscle aches, sleepiness, a sore throat and wheezing. Pertinent negatives include no abdominal pain, ear pain, nausea or vomiting. She has tried fluids (Aleve.) for the symptoms. The treatment provided mild relief.  Husband has been sick as well. She states he was given an antibiotic and told he did not have the flu. She would like a flu test so she can tell her daughter she does not have the flu so she can see her grandson.     No Known Allergies Previous Medications   ALBUTEROL (PROVENTIL HFA) 108 (90 BASE) MCG/ACT INHALER    Inhale into the lungs.   ALPRAZOLAM (XANAX) 0.5 MG TABLET    TAKE ONE TABLET BY MOUTH TWICE DAILY AS NEEDED.   CITALOPRAM (CELEXA) 40 MG TABLET    Take 1 tablet (40 mg total) by mouth daily.   CYCLOBENZAPRINE (FLEXERIL) 10 MG TABLET    Take 10 mg by mouth 3 (three) times daily as needed for muscle spasms.   FLUTICASONE (FLONASE) 50 MCG/ACT NASAL SPRAY    Place 2 sprays into the nose as needed.    GABAPENTIN (NEURONTIN) 300 MG CAPSULE    Take 300 mg by mouth 2 (two) times daily.    IBUPROFEN (ADVIL,MOTRIN) 600 MG TABLET    Take 1 tablet (600 mg total) by mouth every 6 (six) hours as needed.   INDOMETHACIN ER PO    Take by mouth as needed.    LORATADINE (CLARITIN) 10 MG TABLET    Take 10 mg by mouth daily as needed.    OMEGA-3 FATTY ACIDS PO       VITAMIN D, ERGOCALCIFEROL, (DRISDOL) 50000  UNITS CAPS CAPSULE    Take 1 capsule (50,000 Units total) by mouth every 7 (seven) days.    Review of Systems  Constitutional: Positive for fever and fatigue (Just in the weekend). Negative for chills.  HENT: Positive for congestion, postnasal drip, sinus pressure, sneezing and sore throat. Negative for ear pain and rhinorrhea.   Respiratory: Positive for cough, chest tightness and wheezing.   Cardiovascular: Positive for chest pain (with coughing).  Gastrointestinal: Negative for nausea, vomiting and abdominal pain.  Neurological: Positive for headaches. Negative for dizziness.    Social History  Substance Use Topics  . Smoking status: Never Smoker   . Smokeless tobacco: Never Used  . Alcohol Use: No   Objective:   BP 98/60 mmHg  Pulse 84  Temp(Src) 98.9 F (37.2 C) (Oral)  Resp 16  Wt 202 lb 3.2 oz (91.717 kg)  LMP 11/08/2015  Physical Exam  Constitutional: She appears well-developed and well-nourished. No distress.  HENT:  Head: Normocephalic and atraumatic.  Right Ear: Hearing, tympanic membrane, external ear and ear canal normal. Tympanic membrane is not erythematous and not bulging. No middle ear effusion.  Left Ear: Hearing, external ear and ear canal normal. Tympanic membrane is not erythematous and  not bulging. A middle ear effusion is present.  Nose: Mucosal edema and rhinorrhea present. Right sinus exhibits no maxillary sinus tenderness and no frontal sinus tenderness. Left sinus exhibits maxillary sinus tenderness. Left sinus exhibits no frontal sinus tenderness.  Mouth/Throat: Uvula is midline and mucous membranes are normal. Posterior oropharyngeal erythema present. No oropharyngeal exudate or posterior oropharyngeal edema.  Eyes: Conjunctivae are normal. Pupils are equal, round, and reactive to light. Right eye exhibits no discharge. Left eye exhibits no discharge. No scleral icterus.  Neck: Normal range of motion. Neck supple. No tracheal deviation present. No  thyromegaly present.  Cardiovascular: Normal rate, regular rhythm and normal heart sounds.  Exam reveals no gallop and no friction rub.   No murmur heard. Pulmonary/Chest: Effort normal and breath sounds normal. No stridor. No respiratory distress. She has no wheezes. She has no rales. She exhibits tenderness (right and left sternal borders).  Lymphadenopathy:    She has no cervical adenopathy.  Skin: Skin is warm and dry. She is not diaphoretic.  Vitals reviewed.       Assessment & Plan:     1. Upper respiratory infection Worsening symptoms with fever. She has not responded to over-the-counter treatments. I will give Augmentin as below for upper respiratory infection and sinusitis type symptoms on the left. She may continue to take Mucinex DM as needed for congestion. She may take Tylenol or ibuprofen as needed for fever and pain. I do feel she is also starting to develop costochondritis secondary to her cough. I did advise her to continue the anti-inflammatories for that as well as using icy hot or BenGay over the areas that are tender. We discussed cough suppressants but she states she never uses cough suppressants and does not wish to do so. She needs to make sure to stay well-hydrated and get plenty of rest. She is to call the office if symptoms fail to improve or worsen. - amoxicillin-clavulanate (AUGMENTIN) 875-125 MG tablet; Take 1 tablet by mouth 2 (two) times daily.  Dispense: 20 tablet; Refill: 0  2. Fever, unspecified fever cause Flu test was negative today. - POCT Influenza A/B - POCT rapid strep A  3. Sore throat Rapid strep was negative today. - POCT Influenza A/B - POCT rapid strep A       Mar Daring, PA-C  [facility]

## 2015-11-12 NOTE — Patient Instructions (Signed)
Upper Respiratory Infection, Adult Most upper respiratory infections (URIs) are a viral infection of the air passages leading to the lungs. A URI affects the nose, throat, and upper air passages. The most common type of URI is nasopharyngitis and is typically referred to as "the common cold." URIs run their course and usually go away on their own. Most of the time, a URI does not require medical attention, but sometimes a bacterial infection in the upper airways can follow a viral infection. This is called a secondary infection. Sinus and middle ear infections are common types of secondary upper respiratory infections. Bacterial pneumonia can also complicate a URI. A URI can worsen asthma and chronic obstructive pulmonary disease (COPD). Sometimes, these complications can require emergency medical care and may be life threatening.  CAUSES Almost all URIs are caused by viruses. A virus is a type of germ and can spread from one person to another.  RISKS FACTORS You may be at risk for a URI if:   You smoke.   You have chronic heart or lung disease.  You have a weakened defense (immune) system.   You are very young or very old.   You have nasal allergies or asthma.  You work in crowded or poorly ventilated areas.  You work in health care facilities or schools. SIGNS AND SYMPTOMS  Symptoms typically develop 2-3 days after you come in contact with a cold virus. Most viral URIs last 7-10 days. However, viral URIs from the influenza virus (flu virus) can last 14-18 days and are typically more severe. Symptoms may include:   Runny or stuffy (congested) nose.   Sneezing.   Cough.   Sore throat.   Headache.   Fatigue.   Fever.   Loss of appetite.   Pain in your forehead, behind your eyes, and over your cheekbones (sinus pain).  Muscle aches.  DIAGNOSIS  Your health care provider may diagnose a URI by:  Physical exam.  Tests to check that your symptoms are not due to  another condition such as:  Strep throat.  Sinusitis.  Pneumonia.  Asthma. TREATMENT  A URI goes away on its own with time. It cannot be cured with medicines, but medicines may be prescribed or recommended to relieve symptoms. Medicines may help:  Reduce your fever.  Reduce your cough.  Relieve nasal congestion. HOME CARE INSTRUCTIONS   Take medicines only as directed by your health care provider.   Gargle warm saltwater or take cough drops to comfort your throat as directed by your health care provider.  Use a warm mist humidifier or inhale steam from a shower to increase air moisture. This may make it easier to breathe.  Drink enough fluid to keep your urine clear or pale yellow.   Eat soups and other clear broths and maintain good nutrition.   Rest as needed.   Return to work when your temperature has returned to normal or as your health care provider advises. You may need to stay home longer to avoid infecting others. You can also use a face mask and careful hand washing to prevent spread of the virus.  Increase the usage of your inhaler if you have asthma.   Do not use any tobacco products, including cigarettes, chewing tobacco, or electronic cigarettes. If you need help quitting, ask your health care provider. PREVENTION  The best way to protect yourself from getting a cold is to practice good hygiene.   Avoid oral or hand contact with people with cold   symptoms.   Wash your hands often if contact occurs.  There is no clear evidence that vitamin C, vitamin E, echinacea, or exercise reduces the chance of developing a cold. However, it is always recommended to get plenty of rest, exercise, and practice good nutrition.  SEEK MEDICAL CARE IF:   You are getting worse rather than better.   Your symptoms are not controlled by medicine.   You have chills.  You have worsening shortness of breath.  You have brown or red mucus.  You have yellow or brown nasal  discharge.  You have pain in your face, especially when you bend forward.  You have a fever.  You have swollen neck glands.  You have pain while swallowing.  You have white areas in the back of your throat. SEEK IMMEDIATE MEDICAL CARE IF:   You have severe or persistent:  Headache.  Ear pain.  Sinus pain.  Chest pain.  You have chronic lung disease and any of the following:  Wheezing.  Prolonged cough.  Coughing up blood.  A change in your usual mucus.  You have a stiff neck.  You have changes in your:  Vision.  Hearing.  Thinking.  Mood. MAKE SURE YOU:   Understand these instructions.  Will watch your condition.  Will get help right away if you are not doing well or get worse.   This information is not intended to replace advice given to you by your health care provider. Make sure you discuss any questions you have with your health care provider.   Document Released: 03/02/2001 Document Revised: 01/21/2015 Document Reviewed: 12/12/2013 Elsevier Interactive Patient Education 2016 [employer]  

## 2015-11-18 LAB — LIPID PANEL
Chol/HDL Ratio: 2.3 ratio units (ref 0.0–4.4)
Cholesterol, Total: 121 mg/dL (ref 100–199)
HDL: 52 mg/dL (ref >39)
LDL Calculated: 59 mg/dL (ref 0–99)
Triglycerides: 49 mg/dL (ref 0–149)
VLDL Cholesterol Cal: 10 mg/dL (ref 5–40)

## 2015-11-18 LAB — COMPREHENSIVE METABOLIC PANEL
A/G RATIO: 1.7 (ref 1.1–2.5)
ALBUMIN: 4.1 g/dL (ref 3.5–5.5)
ALT: 12 IU/L (ref 0–32)
AST: 16 IU/L (ref 0–40)
Alkaline Phosphatase: 69 IU/L (ref 39–117)
BILIRUBIN TOTAL: 0.2 mg/dL (ref 0.0–1.2)
BUN / CREAT RATIO: 8 — AB (ref 9–23)
BUN: 6 mg/dL (ref 6–24)
CO2: 25 mmol/L (ref 18–29)
Calcium: 9.1 mg/dL (ref 8.7–10.2)
Chloride: 102 mmol/L (ref 96–106)
Creatinine, Ser: 0.73 mg/dL (ref 0.57–1.00)
GFR, EST AFRICAN AMERICAN: 115 mL/min/{1.73_m2} (ref >59)
GFR, EST NON AFRICAN AMERICAN: 100 mL/min/{1.73_m2} (ref >59)
Globulin, Total: 2.4 g/dL (ref 1.5–4.5)
Glucose: 86 mg/dL (ref 65–99)
Potassium: 4.3 mmol/L (ref 3.5–5.2)
Sodium: 140 mmol/L (ref 134–144)
Total Protein: 6.5 g/dL (ref 6.0–8.5)

## 2015-11-18 LAB — HEMOGLOBIN A1C
Est. average glucose Bld gHb Est-mCnc: 120 mg/dL
HEMOGLOBIN A1C: 5.8 % — AB (ref 4.8–5.6)

## 2015-11-18 LAB — CBC WITH DIFFERENTIAL/PLATELET
BASOS: 1 %
Basophils Absolute: 0 10*3/uL (ref 0.0–0.2)
EOS (ABSOLUTE): 0.1 10*3/uL (ref 0.0–0.4)
Eos: 2 %
HEMOGLOBIN: 11.9 g/dL (ref 11.1–15.9)
Hematocrit: 37.7 % (ref 34.0–46.6)
Immature Grans (Abs): 0 10*3/uL (ref 0.0–0.1)
Immature Granulocytes: 1 %
Lymphocytes Absolute: 1.6 10*3/uL (ref 0.7–3.1)
Lymphs: 42 %
MCH: 23 pg — AB (ref 26.6–33.0)
MCHC: 31.6 g/dL (ref 31.5–35.7)
MCV: 73 fL — AB (ref 79–97)
MONOCYTES: 8 %
MONOS ABS: 0.3 10*3/uL (ref 0.1–0.9)
NEUTROS ABS: 1.8 10*3/uL (ref 1.4–7.0)
Neutrophils: 46 %
Platelets: 355 10*3/uL (ref 150–379)
RBC: 5.18 x10E6/uL (ref 3.77–5.28)
RDW: 14.5 % (ref 12.3–15.4)
WBC: 3.8 10*3/uL (ref 3.4–10.8)

## 2015-11-18 LAB — TSH: TSH: 0.977 u[IU]/mL (ref 0.450–4.500)

## 2016-01-04 ENCOUNTER — Emergency Department: Payer: Federal, State, Local not specified - PPO

## 2016-01-04 ENCOUNTER — Encounter: Payer: Self-pay | Admitting: Emergency Medicine

## 2016-01-04 ENCOUNTER — Emergency Department
Admission: EM | Admit: 2016-01-04 | Discharge: 2016-01-04 | Disposition: A | Discharge status: Home / Self Care | Payer: Federal, State, Local not specified - PPO | Attending: Emergency Medicine | Admitting: Emergency Medicine

## 2016-01-04 ENCOUNTER — Ambulatory Visit
Admission: EM | Admit: 2016-01-04 | Discharge: 2016-01-04 | Disposition: A | Discharge status: Other Acute Inpatient Hospital | Payer: Federal, State, Local not specified - PPO | Attending: Family Medicine | Admitting: Family Medicine

## 2016-01-04 DIAGNOSIS — Z7951 Long term (current) use of inhaled steroids: Secondary | ICD-10-CM | POA: Diagnosis not present

## 2016-01-04 DIAGNOSIS — F329 Major depressive disorder, single episode, unspecified: Secondary | ICD-10-CM | POA: Insufficient documentation

## 2016-01-04 DIAGNOSIS — Z792 Long term (current) use of antibiotics: Secondary | ICD-10-CM | POA: Diagnosis not present

## 2016-01-04 DIAGNOSIS — R079 Chest pain, unspecified: Secondary | ICD-10-CM | POA: Diagnosis not present

## 2016-01-04 DIAGNOSIS — M549 Dorsalgia, unspecified: Secondary | ICD-10-CM | POA: Insufficient documentation

## 2016-01-04 DIAGNOSIS — E871 Hypo-osmolality and hyponatremia: Secondary | ICD-10-CM | POA: Diagnosis not present

## 2016-01-04 DIAGNOSIS — R0789 Other chest pain: Secondary | ICD-10-CM | POA: Diagnosis not present

## 2016-01-04 DIAGNOSIS — E559 Vitamin D deficiency, unspecified: Secondary | ICD-10-CM | POA: Insufficient documentation

## 2016-01-04 DIAGNOSIS — F419 Anxiety disorder, unspecified: Secondary | ICD-10-CM | POA: Insufficient documentation

## 2016-01-04 DIAGNOSIS — R071 Chest pain on breathing: Secondary | ICD-10-CM | POA: Diagnosis not present

## 2016-01-04 DIAGNOSIS — R0781 Pleurodynia: Secondary | ICD-10-CM

## 2016-01-04 DIAGNOSIS — Z791 Long term (current) use of non-steroidal anti-inflammatories (NSAID): Secondary | ICD-10-CM | POA: Insufficient documentation

## 2016-01-04 LAB — COMPREHENSIVE METABOLIC PANEL
ALT: 15 U/L (ref 14–54)
ANION GAP: 8 (ref 5–15)
AST: 20 U/L (ref 15–41)
Albumin: 3.9 g/dL (ref 3.5–5.0)
Alkaline Phosphatase: 54 U/L (ref 38–126)
BILIRUBIN TOTAL: 0.4 mg/dL (ref 0.3–1.2)
BUN: 9 mg/dL (ref 6–20)
CO2: 24 mmol/L (ref 22–32)
Calcium: 8.9 mg/dL (ref 8.9–10.3)
Chloride: 105 mmol/L (ref 101–111)
Creatinine, Ser: 0.72 mg/dL (ref 0.44–1.00)
GFR calc Af Amer: >60 mL/min (ref >60)
Glucose, Bld: 83 mg/dL (ref 65–99)
POTASSIUM: 3.5 mmol/L (ref 3.5–5.1)
Sodium: 137 mmol/L (ref 135–145)
TOTAL PROTEIN: 6.9 g/dL (ref 6.5–8.1)

## 2016-01-04 LAB — CBC WITH DIFFERENTIAL/PLATELET
Basophils Absolute: 0 10*3/uL (ref 0–0.1)
Basophils Relative: 1 %
Eosinophils Absolute: 0 10*3/uL (ref 0–0.7)
Eosinophils Relative: 1 %
HEMATOCRIT: 36.3 % (ref 35.0–47.0)
Hemoglobin: 12 g/dL (ref 12.0–16.0)
Lymphocytes Relative: 35 %
Lymphs Abs: 1.7 10*3/uL (ref 1.0–3.6)
MCH: 24 pg — ABNORMAL LOW (ref 26.0–34.0)
MCHC: 33.1 g/dL (ref 32.0–36.0)
MCV: 72.5 fL — AB (ref 80.0–100.0)
MONO ABS: 0.4 10*3/uL (ref 0.2–0.9)
Monocytes Relative: 9 %
Neutro Abs: 2.7 10*3/uL (ref 1.4–6.5)
Neutrophils Relative %: 56 %
Platelets: 262 10*3/uL (ref 150–440)
RBC: 5.01 MIL/uL (ref 3.80–5.20)
RDW: 15.4 % — AB (ref 11.5–14.5)
WBC: 4.9 10*3/uL (ref 3.6–11.0)

## 2016-01-04 LAB — GLUCOSE, CAPILLARY: Glucose-Capillary: 62 mg/dL — ABNORMAL LOW (ref 65–99)

## 2016-01-04 LAB — TROPONIN I

## 2016-01-04 MED ORDER — HYDROMORPHONE HCL 2 MG PO TABS: 2.0000 mg | ORAL_TABLET | Freq: Two times a day (BID) | ORAL | Status: DC | PRN

## 2016-01-04 MED ORDER — LORAZEPAM 2 MG/ML IJ SOLN
1.0000 mg | Freq: Once | INTRAMUSCULAR | Status: DC
  Filled 2016-01-04: qty 1

## 2016-01-04 MED ORDER — HYDROMORPHONE HCL 1 MG/ML IJ SOLN
1.0000 mg | Freq: Once | INTRAMUSCULAR | Status: AC
  Administered 2016-01-04: 1 mg via INTRAVENOUS
  Filled 2016-01-04: qty 1

## 2016-01-04 MED ORDER — LORAZEPAM 1 MG PO TABS: 1.0000 mg | ORAL_TABLET | Freq: Two times a day (BID) | ORAL | Status: DC | PRN

## 2016-01-04 MED ORDER — IOPAMIDOL (ISOVUE-370) INJECTION 76%
75.0000 mL | Freq: Once | INTRAVENOUS | Status: AC | PRN
  Administered 2016-01-04: 75 mL via INTRAVENOUS

## 2016-01-04 MED ORDER — ONDANSETRON HCL 4 MG/2ML IJ SOLN
4.0000 mg | Freq: Once | INTRAMUSCULAR | Status: DC
  Filled 2016-01-04: qty 2

## 2016-01-04 NOTE — ED Provider Notes (Addendum)
CSN: [id number]     Arrival date & time 01/04/16  1249 History   First MD Initiated Contact with Patient 01/04/16 1309     Chief Complaint  Patient presents with  . Back Pain   (Consider location/radiation/quality/duration/timing/severity/associated sxs/prior Treatment) HPI: Patient presents today with symptoms of chest pain extending to the right shoulder and arm and also pain between her shoulder blades. Patient states that symptoms were there initially when she woke up but got worse after she got back from church today. Her husband did try to massage her back earlier today. She denies any injury or trauma to the area. She denies any URI symptoms, diaphoresis, headache. Patient states that she experiences the pain when she takes a deep breath. She does not have an increase in her symptoms with movement of her upper extremities. She denies any history of any cardiac problems. She denies any smoking history. She does state that she has had some burning pain in her right leg that she has experienced earlier this week. She denies any swelling of the lower extremities. She denies any history of DVT/PE. She did take one baby aspirin earlier today.  Past Medical History  Diagnosis Date  . Anemia   . Anxiety   . Depression   . Vitamin D deficiency   . Vitamin B12 deficiency    Past Surgical History  Procedure Laterality Date  . Brain surgery    . Cesarean section  1996  . Tubal ligation  2002   Family History  Problem Relation Age of Onset  . Hypertension Mother   . Cancer Mother     Laryngeal and lymphoma  . Hypertension Sister   . Heart disease Maternal Grandmother    Social History  Substance Use Topics  . Smoking status: Never Smoker   . Smokeless tobacco: Never Used  . Alcohol Use: No   OB History    No data available     Review of Systems: Negative except mentioned above.  Allergies  Review of patient's allergies indicates no known allergies.  Home Medications   Prior  to Admission medications   Medication Sig Start Date End Date Taking? Authorizing Provider  amoxicillin-clavulanate (AUGMENTIN) 875-125 MG tablet Take 1 tablet by mouth 2 (two) times daily. 11/12/15  Yes Clearnce Sorrel Burnette, PA-C  cyclobenzaprine (FLEXERIL) 10 MG tablet Take 10 mg by mouth 3 (three) times daily as needed for muscle spasms.   Yes Historical Provider, MD  fluticasone (FLONASE) 50 MCG/ACT nasal spray Place 2 sprays into the nose as needed.  07/10/14  Yes Historical Provider, MD  gabapentin (NEURONTIN) 300 MG capsule Take 300 mg by mouth 2 (two) times daily.  05/17/12  Yes Historical Provider, MD  ibuprofen (ADVIL,MOTRIN) 600 MG tablet Take 1 tablet (600 mg total) by mouth every 6 (six) hours as needed. 06/29/15  Yes Andria Meuse, NP  loratadine (CLARITIN) 10 MG tablet Take 10 mg by mouth daily as needed.    Yes Historical Provider, MD  OMEGA-3 FATTY ACIDS PO  05/01/09  Yes Historical Provider, MD  Vitamin D, Ergocalciferol, (DRISDOL) 50000 units CAPS capsule Take 1 capsule (50,000 Units total) by mouth every 7 (seven) days. 11/04/15  Yes Clearnce Sorrel Burnette, PA-C  albuterol (PROVENTIL HFA) 108 (90 BASE) MCG/ACT inhaler Inhale into the lungs. 01/13/15   Historical Provider, MD  ALPRAZolam Duanne Moron) 0.5 MG tablet TAKE ONE TABLET BY MOUTH TWICE DAILY AS NEEDED. 11/04/15   Mar Daring, PA-C  citalopram (CELEXA) 40 MG tablet  Take 1 tablet (40 mg total) by mouth daily. 11/04/15   Mar Daring, PA-C  INDOMETHACIN ER PO Take by mouth as needed.     Historical Provider, MD   Meds Ordered and Administered this Visit  Medications - No data to display  BP 122/84 mmHg  Pulse 80  Temp(Src) 97.9 F (36.6 C) (Tympanic)  Resp 20  Ht 5\' 5"  (1.651 m)  Wt 190 lb (86.183 kg)  BMI 31.62 kg/m2  SpO2 99%  LMP 01/04/2016 (Exact Date) No data found.   Physical Exam   GENERAL: mild discomfort  HEENT: no pharyngeal erythema, no exudate RESP: unable to fully take a deep breath without  experiencing the upper back pain otherwise clear, no wheezing  CARD: RRR ABD: +BS, NT MSK: no tenderness to palpation of back, full range of motion of upper extremities, nv intact, -Homans NEURO: CN II-XII grossly intact   ED Course  Procedures (including critical care time)  Labs Review Labs Reviewed  GLUCOSE, CAPILLARY - Abnormal; Notable for the following:    Glucose-Capillary 62 (*)    All other components within normal limits  CBG MONITORING, ED    Imaging Review No results found.  A/P: Chest pain, upper back pain-given patient's symptoms and would recommend further evaluation and treatment at the ER. Could possibly be musculoskeletal in etiology however would like to rule out cardiac/pulmonary source. Her EKG does show normal sinus rhythm with no ST elevations or depressions, with possible left atrial enlargement, I do agree with reading of the EKG printout, patient will likely need cardiac enzymes and d-dimer with imaging. IV Hep-Lock, O2 nasal cannula started. Three baby aspirin given to patient prior to EMS taking patient to the ER for further evaluation and treatment.    Paulina Fusi, MD 01/04/16 1337  Paulina Fusi, MD 01/04/16 [phone]

## 2016-01-04 NOTE — ED Notes (Signed)
Pt presents to ED from [facility] urgent care via EMS with c/o chest pain. Pt states sharp pain in between shoulder blades."hurts when I breath". Started after church and increased in discomfort.

## 2016-01-04 NOTE — ED Notes (Signed)
Patient states she woke with back pain which has gotten much worse, has pain between shoulders and in center of her chest

## 2016-01-04 NOTE — Discharge Instructions (Signed)
Chest Wall Pain Chest wall pain is pain in or around the bones and muscles of your chest. Sometimes, an injury causes this pain. Sometimes, the cause may not be known. This pain may take several weeks or longer to get better. HOME CARE INSTRUCTIONS  Pay attention to any changes in your symptoms. Take these actions to help with your pain:   Rest as told by your health care provider.   Avoid activities that cause pain. These include any activities that use your chest muscles or your abdominal and side muscles to lift heavy items.   If directed, apply ice to the painful area:  Put ice in a plastic bag.  Place a towel between your skin and the bag.  Leave the ice on for 20 minutes, 2-3 times per day.  Take over-the-counter and prescription medicines only as told by your health care provider.  Do not use tobacco products, including cigarettes, chewing tobacco, and e-cigarettes. If you need help quitting, ask your health care provider.  Keep all follow-up visits as told by your health care provider. This is important. SEEK MEDICAL CARE IF:  You have a fever.  Your chest pain becomes worse.  You have new symptoms. SEEK IMMEDIATE MEDICAL CARE IF:  You have nausea or vomiting.  You feel sweaty or light-headed.  You have a cough with phlegm (sputum) or you cough up blood.  You develop shortness of breath.   This information is not intended to replace advice given to you by your health care provider. Make sure you discuss any questions you have with your health care provider.   Document Released: 09/06/2005 Document Revised: 05/28/2015 Document Reviewed: 12/02/2014 Elsevier Interactive Patient Education [employer].  Please return immediately if condition worsens. Please contact her primary physician or the physician you were given for referral. If you have any specialist physicians involved in her treatment and plan please also contact them. Thank you for using [facility]. You can also take over-the-counter ibuprofen and Tylenol for pain.

## 2016-01-04 NOTE — ED Provider Notes (Signed)
Time Seen: Approximately 1430  I have reviewed the triage notes  Chief Complaint: Chest Pain   History of Present Illness: Virginia Woods is a 45 y.o. female *who presents after a fall from an urgent care center for evaluation of back and chest discomfort. Patient states that she started with some back pain this morning but thought that she slept wrong. She states as the day progressed and about 11 AM she was at church today and started having some anterior chest pain. She took 2 aspirin prior to arrival. She went to an urgent care center no performed an EKG but were concerned that her pain was pleuritic in nature would receive further evaluation here in the emergency department. Eyes any nausea, vomiting, fever. Pain is worse with movement and also deep inspiration. She denies any new leg pain or swelling on the posterior surface of her leg. 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 She denies any new weakness but has noticed some burning on the lateral surface of her right leg 3 days ago. She denies any left-sided arm or jaw pain. She states having menstrual periods otherwise denies any strong risk factors for cardiovascular disease.   Past Medical History  Diagnosis Date  . Anemia   . Anxiety   . Depression   . Vitamin D deficiency   . Vitamin B12 deficiency     Patient Active Problem List   Diagnosis Date Noted  . Allergic rhinitis 05/20/2015  . 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 Chronic headache 05/20/2015  . Chronic pain 05/20/2015  . Clinical depression 05/20/2015  . Dermatitis, eczematoid 05/20/2015  . Acute Lyme disease 05/20/2015  . Muscle ache 05/20/2015  . Apnea, sleep 05/20/2015  . B12 deficiency 05/20/2015  . Avitaminosis D 05/20/2015  . Depression 05/13/2015  . Hypo-osmolality and hyponatremia 10/13/2009  . Anemia, iron deficiency 05/16/2009  . Cannot sleep 04/18/2008  . Anxiety  11/05/2004    Past Surgical History  Procedure Laterality Date  . Brain surgery    . Cesarean section  1996  . Tubal ligation  2002    Past Surgical History  Procedure Laterality Date  . Brain surgery    . Cesarean section  1996  . Tubal ligation  2002    Current Outpatient Rx  Name  Route  Sig  Dispense  Refill  . albuterol (PROVENTIL HFA) 108 (90 BASE) MCG/ACT inhaler   Inhalation   Inhale into the lungs.         . ALPRAZolam (XANAX) 0.5 MG tablet      TAKE ONE TABLET BY MOUTH TWICE DAILY AS NEEDED.   40 tablet   1   . amoxicillin-clavulanate (AUGMENTIN) 875-125 MG tablet   Oral   Take 1 tablet by mouth 2 (two) times daily.   20 tablet   0   . citalopram (CELEXA) 40 MG tablet   Oral   Take 1 tablet (40 mg total) by mouth daily.   90 tablet   3   . cyclobenzaprine (FLEXERIL) 10 MG tablet   Oral   Take 10 mg by mouth 3 (three) times daily as needed for muscle spasms.         . fluticasone (FLONASE) 50 MCG/ACT nasal spray   Nasal   Place 2 sprays into the nose as needed.          . gabapentin (NEURONTIN) 300 MG capsule   Oral  Take 300 mg by mouth 2 (two) times daily.          Marland Kitchen ibuprofen (ADVIL,MOTRIN) 600 MG tablet   Oral   Take 1 tablet (600 mg total) by mouth every 6 (six) hours as needed.   30 tablet   0   . INDOMETHACIN ER PO   Oral   Take by mouth as needed.          . loratadine (CLARITIN) 10 MG tablet   Oral   Take 10 mg by mouth daily as needed.          . OMEGA-3 FATTY ACIDS PO               . Vitamin D, Ergocalciferol, (DRISDOL) 50000 units CAPS capsule   Oral   Take 1 capsule (50,000 Units total) by mouth every 7 (seven) days.   4 capsule   6     Allergies:  Review of patient's allergies indicates no known allergies.  Family History: Family History  Problem Relation Age of Onset  . Hypertension Mother   . Cancer Mother     Laryngeal and lymphoma  . Hypertension Sister   . Heart disease Maternal  Grandmother     Social History: Social History  Substance Use Topics  . Smoking status: Never Smoker   . Smokeless tobacco: Never Used  . Alcohol Use: No     Review of Systems:   10 point review of systems was performed and was otherwise negative:  Constitutional: No fever Eyes: No visual disturbances ENT: No sore throat, ear pain Cardiac: Chest pain mainly worse with movement especially on the right side. Respiratory: No shortness of breath, wheezing, or stridor Abdomen: No abdominal pain, no vomiting, No diarrhea Endocrine: No weight loss, No night sweats Extremities: No peripheral edema, cyanosis Skin: No rashes, easy bruising Neurologic: No focal weakness, trouble with speech or swollowing Urologic: No dysuria, Hematuria, or urinary frequency *  Physical Exam:  ED Triage Vitals  Enc Vitals Group     BP 01/04/16 1423 129/70 mmHg     Pulse Rate 01/04/16 1423 80     Resp 01/04/16 1423 18     Temp 01/04/16 1423 98.3 F (36.8 C)     Temp Source 01/04/16 1423 Oral     SpO2 01/04/16 1423 100 %     Weight 01/04/16 1423 205 lb 4.8 oz (93.123 kg)     Height 01/04/16 1423 5\' 6"  (1.676 m)     Head Cir --      Peak Flow --      Pain Score --      Pain Loc --      Pain Edu? --      Excl. in [place]? --     General: Awake , Alert , and Oriented times 3; GCS 15 Head: Normal cephalic , atraumatic Eyes: Pupils equal , round, reactive to light Nose/Throat: No nasal drainage, patent upper airway without erythema or exudate.  Neck: Supple, Full range of motion, No anterior adenopathy or palpable thyroid masses Lungs: Clear to ascultation without wheezes , rhonchi, or rales Heart: Regular rate, regular rhythm without murmurs , gallops , or rubs Abdomen: Soft, non tender without rebound, guarding , or rigidity; bowel sounds positive and symmetric in all 4 quadrants. No organomegaly .        Extremities: 2 plus symmetric pulses. No edema, clubbing or cyanosis Neurologic: normal  ambulation, Motor symmetric without deficits, sensory intact Skin: warm, dry, no  rashes   Labs:   All laboratory work was reviewed including any pertinent negatives or positives listed below:  Labs Reviewed  CBC WITH DIFFERENTIAL/PLATELET  COMPREHENSIVE METABOLIC PANEL  TROPONIN I   laboratory work was reviewed which showed no significant findings  EKG: ED ECG REPORT I, Daymon Larsen, the attending physician, personally viewed and interpreted this ECG.  Date: 01/04/2016 EKG Time: 1424 Rate: 70. Rhythm: normal sinus rhythm QRS Axis: normal Intervals: RSR prime ST/T Wave abnormalities: No ST elevation or depression d  Conduction Disturbances: none Narrative Interpretation: unremarkable No acute ischemic changes noted   Radiology:   Chest CT showed a small pulmonary nodule but otherwise no evidence of pulmonary embolism or aortic dissection   I personally reviewed the radiologic studies     ED Course:  Patient received general pain medication here in the emergency department with Dilaudid and Ativan for muscle spasm. Patient had symptomatic improvement I felt we had adequately ruled out life-threatening causes for her chest pain. Patient  most likely musculoskeletal in nature. I felt was unlikely this was a pulmonary embolism and she is at low risk for acute coronary disease.    Assessment:  Acute pleuritic chest pain Musculoskeletal chest pain  Final Clinical Impression:   Final diagnoses:  Pleuritic chest pain     Plan:  Outpatient management               Daymon Larsen, MD 01/04/16 [phone]

## 2016-01-27 DIAGNOSIS — F438 Other reactions to severe stress: Secondary | ICD-10-CM | POA: Diagnosis not present

## 2016-02-10 DIAGNOSIS — F438 Other reactions to severe stress: Secondary | ICD-10-CM | POA: Diagnosis not present

## 2016-02-23 DIAGNOSIS — F438 Other reactions to severe stress: Secondary | ICD-10-CM | POA: Diagnosis not present

## 2016-03-02 DIAGNOSIS — F438 Other reactions to severe stress: Secondary | ICD-10-CM | POA: Diagnosis not present

## 2016-03-03 ENCOUNTER — Ambulatory Visit
Admission: RE | Admit: 2016-03-03 | Discharge: 2016-03-03 | Disposition: A | Discharge status: Home / Self Care | Payer: Federal, State, Local not specified - PPO | Source: Ambulatory Visit | Attending: Physician Assistant | Admitting: Physician Assistant

## 2016-03-03 ENCOUNTER — Encounter: Payer: Self-pay | Admitting: Physician Assistant

## 2016-03-03 ENCOUNTER — Telehealth: Payer: Self-pay

## 2016-03-03 ENCOUNTER — Ambulatory Visit (INDEPENDENT_AMBULATORY_CARE_PROVIDER_SITE_OTHER): Payer: Federal, State, Local not specified - PPO | Admitting: Physician Assistant

## 2016-03-03 VITALS — BP 120/70 | HR 95 | Temp 98.6°F | Resp 16 | Wt 203.6 lb

## 2016-03-03 DIAGNOSIS — M542 Cervicalgia: Secondary | ICD-10-CM | POA: Insufficient documentation

## 2016-03-03 DIAGNOSIS — F438 Other reactions to severe stress: Secondary | ICD-10-CM | POA: Diagnosis not present

## 2016-03-03 DIAGNOSIS — M25551 Pain in right hip: Secondary | ICD-10-CM | POA: Diagnosis not present

## 2016-03-03 DIAGNOSIS — R202 Paresthesia of skin: Secondary | ICD-10-CM

## 2016-03-03 DIAGNOSIS — M47812 Spondylosis without myelopathy or radiculopathy, cervical region: Secondary | ICD-10-CM | POA: Diagnosis not present

## 2016-03-03 NOTE — Patient Instructions (Signed)

## 2016-03-03 NOTE — Progress Notes (Signed)
Patient: Virginia Woods Female    DOB: 1971/08/11   45 y.o.   MRN: [id number] Visit Date: 03/03/2016  Today's Provider: Mar Daring, PA-C   Chief Complaint  Patient presents with  . Hip Pain   Subjective:    Hip Pain  The incident occurred more than 1 week ago (Over a month). There was no injury mechanism. The pain is present in the right hip. The quality of the pain is described as aching. The pain is at a severity of 7/10. The pain is moderate. The pain has been fluctuating (comes and goes) since onset. Associated symptoms include an inability to bear weight (when it hurst she needs to lean to the left side becuase she can't put the weight on it. ) and numbness (UE). The symptoms are aggravated by movement and weight bearing (Sitting for a long time or turning at night in bed,. She reports that she can hear something popping.). She has tried heat and ice (Turmeric 1000 mg for the past two weeks. Gabapentin, Flexeril and stretching) for the symptoms. The treatment provided no relief.   The hip pain waxes and wanes but is gradually worsening. She is using flexeril, IBU and gabapentin without relief. She states sometimes it feels like there is a "catch" and intense pain, "almost like it is popping out of joint." The pain is anterior and deep in the groin. She did have burning of the right lower extremity in 12/2015, but not currently. She also has had to increase walking up and down stairs. They recently (in the last 6 months) moved into a new house with stairs. Pain does not seem muscular.   She also is still complaining of UE numbness mostly in the morning. She has had this issue since her surgeries for her arnold-chiari shunt repair. She uses gabapentin but it does not help.     No Known Allergies Current Meds  Medication Sig  . albuterol (PROVENTIL HFA) 108 (90 BASE) MCG/ACT inhaler Inhale into the lungs.  . ALPRAZolam (XANAX) 0.5 MG tablet TAKE ONE TABLET BY MOUTH  TWICE DAILY AS NEEDED.  . citalopram (CELEXA) 40 MG tablet Take 1 tablet (40 mg total) by mouth daily.  . cyclobenzaprine (FLEXERIL) 10 MG tablet Take 10 mg by mouth 3 (three) times daily as needed for muscle spasms.  . fluticasone (FLONASE) 50 MCG/ACT nasal spray Place 2 sprays into the nose as needed.   . gabapentin (NEURONTIN) 300 MG capsule Take 300 mg by mouth 2 (two) times daily.   Marland Kitchen ibuprofen (ADVIL,MOTRIN) 600 MG tablet Take 1 tablet (600 mg total) by mouth every 6 (six) hours as needed.  . INDOMETHACIN ER PO Take by mouth as needed.   . loratadine (CLARITIN) 10 MG tablet Take 10 mg by mouth daily as needed.   Marland Kitchen LORazepam (ATIVAN) 1 MG tablet Take 1 tablet (1 mg total) by mouth 2 (two) times daily as needed (muscle relaxation).  . OMEGA-3 FATTY ACIDS PO   . Vitamin D, Ergocalciferol, (DRISDOL) 50000 units CAPS capsule Take 1 capsule (50,000 Units total) by mouth every 7 (seven) days.    Review of Systems  Respiratory: Negative.   Cardiovascular: Negative.   Musculoskeletal: Positive for arthralgias, gait problem and neck pain. Negative for myalgias and joint swelling.  Neurological: Positive for weakness (UE), numbness (UE) and headaches. Negative for dizziness.    Social History  Substance Use Topics  . Smoking status: Never Smoker   . Smokeless  tobacco: Never Used  . Alcohol Use: No   Objective:   BP 120/70 mmHg  Pulse 95  Temp(Src) 98.6 F (37 C) (Oral)  Resp 16  Wt 203 lb 9.6 oz (92.352 kg)  LMP 02/23/2016  Physical Exam  Constitutional: She appears well-developed and well-nourished. No distress.  Neck: Normal range of motion. Neck supple. No tracheal deviation present. No thyromegaly present.  Cardiovascular: Normal rate, regular rhythm and normal heart sounds.  Exam reveals no gallop and no friction rub.   No murmur heard. Pulmonary/Chest: Effort normal and breath sounds normal. No respiratory distress. She has no wheezes. She has no rales.  Musculoskeletal:        Right hip: She exhibits tenderness (deep groin anteriorly). She exhibits normal range of motion (stiffness when getting up), normal strength, no bony tenderness, no swelling, no crepitus, no deformity and no laceration.       Left hip: Normal.  Lymphadenopathy:    She has no cervical adenopathy.  Skin: She is not diaphoretic.  Vitals reviewed.       Assessment & Plan:     1. Right hip pain I do feel her right hip pain may be arthritis vs labral tear/issue. She is overweight and has been using stairs more frequently. Will get Xray to evaluate for bony abnormality. Discussed may need MRI and orthopedic evaluation. She voiced understanding. Will f/u pending results. She is to continue IBU, flexeril (if needed) and gabapentin. - DG HIP UNILAT WITH PELVIS 2-3 VIEWS RIGHT; Future  2. Cervicalgia Will obtain xray as she has not had imaging of her neck in a while. Question DDD as cause of UE numbness vs a issue stemming from her arnold-chiari shunt repair. Will f/u pending results. - DG Cervical Spine 2 or 3 views; Future  3. Paresthesia of both hands See above medical treatment plan. - DG Cervical Spine 2 or 3 views; Future       Mar Daring, PA-C  [facility]

## 2016-03-03 NOTE — Telephone Encounter (Signed)
Contacted pt to schedule colonoscopy. Pt will call back. Not able to do triage at the moment.

## 2016-03-04 ENCOUNTER — Telehealth: Payer: Self-pay

## 2016-03-04 DIAGNOSIS — M25551 Pain in right hip: Secondary | ICD-10-CM

## 2016-03-04 DIAGNOSIS — M5030 Other cervical disc degeneration, unspecified cervical region: Secondary | ICD-10-CM

## 2016-03-04 NOTE — Telephone Encounter (Signed)
-----   Message from Mar Daring, [place] sent at 03/04/2016  8:27 AM EDT ----- Cervical spine xray shows degenerative disc disease which may be causing symptoms. Hip xray was normal. No arthritis or decrease in joint space noted. Consider PT for each of these to see if this may relieve pain. If no improvement in hip after PT, recommend MRI and ortho referral. Please let me know if she agrees to PT and will place order.

## 2016-03-04 NOTE — Telephone Encounter (Signed)
Not available.  Thanks,  -Shameria Trimarco 

## 2016-03-05 NOTE — Telephone Encounter (Signed)
Patient advised as directed below. She will do PT and [facility] the hospital. [place].  Thanks,  -Zaul Hubers

## 2016-03-05 NOTE — Telephone Encounter (Signed)
Referral order placed.

## 2016-03-05 NOTE — Telephone Encounter (Signed)
Pt called back about results, she states she is not sure what happened because she did not see a missed call from [place], please call her back at [phone]

## 2016-03-08 ENCOUNTER — Telehealth: Payer: Self-pay | Admitting: Physician Assistant

## 2016-03-08 DIAGNOSIS — M25551 Pain in right hip: Secondary | ICD-10-CM

## 2016-03-08 NOTE — Telephone Encounter (Signed)
Pt states that she is hearing clicking sounds from her right hip and still in pain.She is afraid that physical therapy could make it worse without knowing what is causing this.She is requesting to see an orthopaedic doctor

## 2016-03-08 NOTE — Telephone Encounter (Signed)
Will order referral.

## 2016-03-11 DIAGNOSIS — F438 Other reactions to severe stress: Secondary | ICD-10-CM | POA: Diagnosis not present

## 2016-03-11 DIAGNOSIS — S76211A Strain of adductor muscle, fascia and tendon of right thigh, initial encounter: Secondary | ICD-10-CM | POA: Diagnosis not present

## 2016-04-01 DIAGNOSIS — H04123 Dry eye syndrome of bilateral lacrimal glands: Secondary | ICD-10-CM | POA: Diagnosis not present

## 2016-04-13 DIAGNOSIS — F438 Other reactions to severe stress: Secondary | ICD-10-CM | POA: Diagnosis not present

## 2016-04-26 DIAGNOSIS — F438 Other reactions to severe stress: Secondary | ICD-10-CM | POA: Diagnosis not present

## 2016-04-27 DIAGNOSIS — F438 Other reactions to severe stress: Secondary | ICD-10-CM | POA: Diagnosis not present

## 2016-04-29 DIAGNOSIS — F438 Other reactions to severe stress: Secondary | ICD-10-CM | POA: Diagnosis not present

## 2016-05-04 NOTE — Telephone Encounter (Signed)
Mailed letter requesting a call back to schedule colonoscopy.

## 2016-06-11 ENCOUNTER — Other Ambulatory Visit: Payer: Self-pay | Admitting: Physician Assistant

## 2016-06-11 DIAGNOSIS — F419 Anxiety disorder, unspecified: Secondary | ICD-10-CM

## 2016-06-11 NOTE — Telephone Encounter (Signed)
Called Prescription for Xanax to [address].  Thanks,  -Emily Massar

## 2016-06-17 DIAGNOSIS — F438 Other reactions to severe stress: Secondary | ICD-10-CM | POA: Diagnosis not present

## 2016-06-23 DIAGNOSIS — F438 Other reactions to severe stress: Secondary | ICD-10-CM | POA: Diagnosis not present

## 2016-06-24 DIAGNOSIS — F438 Other reactions to severe stress: Secondary | ICD-10-CM | POA: Diagnosis not present

## 2016-07-01 DIAGNOSIS — B009 Herpesviral infection, unspecified: Secondary | ICD-10-CM | POA: Diagnosis not present

## 2016-07-01 DIAGNOSIS — R21 Rash and other nonspecific skin eruption: Secondary | ICD-10-CM | POA: Diagnosis not present

## 2016-07-13 ENCOUNTER — Telehealth: Payer: Self-pay | Admitting: Physician Assistant

## 2016-07-13 NOTE — Telephone Encounter (Signed)
Called Pt to schedule AWV with [facility] and CPE with PCP 11/04/16- knb

## 2016-07-13 NOTE — Telephone Encounter (Signed)
Thank you :)

## 2016-08-24 DIAGNOSIS — L720 Epidermal cyst: Secondary | ICD-10-CM | POA: Diagnosis not present

## 2016-08-24 DIAGNOSIS — D239 Other benign neoplasm of skin, unspecified: Secondary | ICD-10-CM | POA: Diagnosis not present

## 2016-09-23 ENCOUNTER — Telehealth: Payer: Self-pay | Admitting: Physician Assistant

## 2016-09-23 DIAGNOSIS — R112 Nausea with vomiting, unspecified: Secondary | ICD-10-CM

## 2016-09-23 MED ORDER — ONDANSETRON 4 MG PO TBDP: 4.0000 mg | ORAL_TABLET | Freq: Three times a day (TID) | ORAL | 0 refills | Status: DC | PRN

## 2016-09-23 NOTE — Telephone Encounter (Signed)
Pt stated that she has been throwing up and having diarrhea. Pt stated that she spoke with a nurse with the after hours clinic and they advised her Zofran would be sent in but she told them the wrong pharmacy and would like it sent to [place].

## 2016-09-23 NOTE — Telephone Encounter (Signed)
Zofran sent to [place]

## 2016-09-23 NOTE — Telephone Encounter (Signed)
Please Review.  Thanks,  -Zarion Oliff 

## 2016-10-11 ENCOUNTER — Other Ambulatory Visit: Payer: Self-pay | Admitting: Physician Assistant

## 2016-10-11 DIAGNOSIS — F419 Anxiety disorder, unspecified: Secondary | ICD-10-CM

## 2016-11-08 DIAGNOSIS — G935 Compression of brain: Secondary | ICD-10-CM | POA: Insufficient documentation

## 2016-11-17 DIAGNOSIS — S0502XA Injury of conjunctiva and corneal abrasion without foreign body, left eye, initial encounter: Secondary | ICD-10-CM | POA: Diagnosis not present

## 2016-11-18 ENCOUNTER — Other Ambulatory Visit: Payer: Self-pay | Admitting: Physician Assistant

## 2016-11-18 DIAGNOSIS — F419 Anxiety disorder, unspecified: Secondary | ICD-10-CM

## 2016-11-18 NOTE — Telephone Encounter (Signed)
Done  ED 

## 2016-12-01 ENCOUNTER — Encounter: Payer: Self-pay | Admitting: Physician Assistant

## 2016-12-20 DIAGNOSIS — F40240 Claustrophobia: Secondary | ICD-10-CM | POA: Insufficient documentation

## 2016-12-29 ENCOUNTER — Encounter: Payer: Federal, State, Local not specified - PPO | Admitting: Physician Assistant

## 2017-01-26 ENCOUNTER — Encounter: Payer: Federal, State, Local not specified - PPO | Admitting: Physician Assistant

## 2017-02-04 ENCOUNTER — Other Ambulatory Visit: Payer: Self-pay | Admitting: Physician Assistant

## 2017-02-04 DIAGNOSIS — F419 Anxiety disorder, unspecified: Secondary | ICD-10-CM

## 2017-02-04 DIAGNOSIS — F329 Major depressive disorder, single episode, unspecified: Secondary | ICD-10-CM

## 2017-02-04 DIAGNOSIS — F32A Depression, unspecified: Secondary | ICD-10-CM

## 2017-02-04 DIAGNOSIS — E559 Vitamin D deficiency, unspecified: Secondary | ICD-10-CM

## 2017-03-02 ENCOUNTER — Ambulatory Visit (INDEPENDENT_AMBULATORY_CARE_PROVIDER_SITE_OTHER): Payer: Federal, State, Local not specified - PPO | Admitting: Physician Assistant

## 2017-03-02 ENCOUNTER — Encounter: Payer: Self-pay | Admitting: Physician Assistant

## 2017-03-02 VITALS — BP 120/80 | HR 88 | Temp 98.3°F | Resp 16 | Ht 65.0 in | Wt 208.0 lb

## 2017-03-02 DIAGNOSIS — K648 Other hemorrhoids: Secondary | ICD-10-CM | POA: Diagnosis not present

## 2017-03-02 MED ORDER — HYDROCORTISONE ACETATE 25 MG RE SUPP: 25.0000 mg | Freq: Two times a day (BID) | RECTAL | 0 refills | Status: DC

## 2017-03-02 NOTE — Progress Notes (Signed)
Patient: Virginia Woods Female    DOB: 26-Oct-1970   46 y.o.   MRN: [id number] Visit Date: 03/02/2017  Today's Provider: Mar Daring, PA-C   Chief Complaint  Patient presents with  . Hemorrhoids   Subjective:    HPI Patient here today C/O possible hemorrhoids x's a few days. Patient reports itching, and some pain around rectal area. Patient reports that she has been using OTC treatments for hemorrhoids, reports mild improvement. Patient reports she can feel "something sticking out" of her rectal area. Patient denies constipation or straining to have bowel movements. She has used preparation H and witch hazel. She has been doing Sitz baths. She has also used Tuks pads. She does report acute diarrhea last week that may have been the inciting event.    No Known Allergies   Current Outpatient Prescriptions:  .  ALPRAZolam (XANAX) 0.5 MG tablet, TAKE ONE TABLET BY MOUTH TWICE DAILY AS NEEDED, Disp: 60 tablet, Rfl: 0 .  citalopram (CELEXA) 40 MG tablet, TAKE ONE TABLET BY MOUTH ONCE DAILY, Disp: 90 tablet, Rfl: 1 .  cyclobenzaprine (FLEXERIL) 10 MG tablet, Take 10 mg by mouth 3 (three) times daily as needed for muscle spasms., Disp: , Rfl:  .  gabapentin (NEURONTIN) 300 MG capsule, Take 300 mg by mouth 2 (two) times daily. , Disp: , Rfl:  .  ibuprofen (ADVIL,MOTRIN) 600 MG tablet, Take 1 tablet (600 mg total) by mouth every 6 (six) hours as needed., Disp: 30 tablet, Rfl: 0 .  loratadine (CLARITIN) 10 MG tablet, Take 10 mg by mouth daily as needed. , Disp: , Rfl:  .  Vitamin D, Ergocalciferol, (DRISDOL) 50000 units CAPS capsule, TAKE ONE CAPSULE BY MOUTH VERY 7 DAYS, Disp: 12 capsule, Rfl: 1 .  LORazepam (ATIVAN) 1 MG tablet, Take 1 tablet (1 mg total) by mouth 2 (two) times daily as needed (muscle relaxation). (Patient not taking: Reported on 03/02/2017), Disp: 10 tablet, Rfl: 0 .  OMEGA-3 FATTY ACIDS PO, , Disp: , Rfl:   Review of Systems  Constitutional: Negative.     Respiratory: Negative.   Cardiovascular: Negative.   Gastrointestinal: Negative for abdominal pain, anal bleeding, blood in stool, constipation, diarrhea, nausea and rectal pain.       Does have rectal itching, no pain    Social History  Substance Use Topics  . Smoking status: Never Smoker  . Smokeless tobacco: Never Used  . Alcohol use No   Objective:   BP 120/80 (BP Location: Left Arm, Patient Position: Sitting, Cuff Size: Large)   Pulse 88   Temp 98.3 F (36.8 C) (Oral)   Resp 16   Ht 5\' 5"  (1.651 m)   Wt 208 lb (94.3 kg)   LMP 02/22/2017 (Exact Date)   SpO2 98%   BMI 34.61 kg/m  Vitals:   03/02/17 1513  BP: 120/80  Pulse: 88  Resp: 16  Temp: 98.3 F (36.8 C)  TempSrc: Oral  SpO2: 98%  Weight: 208 lb (94.3 kg)  Height: 5\' 5"  (1.651 m)     Physical Exam  Constitutional: She appears well-developed and well-nourished. No distress.  Cardiovascular: Normal rate, regular rhythm and normal heart sounds.  Exam reveals no gallop and no friction rub.   No murmur heard. Pulmonary/Chest: Effort normal and breath sounds normal. No respiratory distress. She has no wheezes. She has no rales.  Genitourinary: Rectal exam shows internal hemorrhoid (small prolapsed internal hemorrhoid located in the 12 o'clock position;did not  reduce; no pain, no bleeding).  Skin: She is not diaphoretic.  Vitals reviewed.      Assessment & Plan:     1. Internal hemorrhoids without complication Will try anusol-HC suppositories as below. Advised to continue sitz baths and warm compresses. She is to call if hemorrhoid becomes painful or does not respond to treatment.  - hydrocortisone (ANUSOL-HC) 25 MG suppository; Place 1 suppository (25 mg total) rectally 2 (two) times daily.  Dispense: 12 suppository; Refill: 0       Mar Daring, PA-C  [facility]

## 2017-03-02 NOTE — Patient Instructions (Signed)
Hemorrhoids Hemorrhoids are swollen veins in and around the rectum or anus. There are two types of hemorrhoids:  Internal hemorrhoids. These occur in the veins that are just inside the rectum. They may poke through to the outside and become irritated and painful.  External hemorrhoids. These occur in the veins that are outside of the anus and can be felt as a painful swelling or hard lump near the anus.  Most hemorrhoids do not cause serious problems, and they can be managed with home treatments such as diet and lifestyle changes. If home treatments do not help your symptoms, procedures can be done to shrink or remove the hemorrhoids. What are the causes? This condition is caused by increased pressure in the anal area. This pressure may result from various things, including:  Constipation.  Straining to have a bowel movement.  Diarrhea.  Pregnancy.  Obesity.  Sitting for long periods of time.  Heavy lifting or other activity that causes you to strain.  Anal sex.  What are the signs or symptoms? Symptoms of this condition include:  Pain.  Anal itching or irritation.  Rectal bleeding.  Leakage of stool (feces).  Anal swelling.  One or more lumps around the anus.  How is this diagnosed? This condition can often be diagnosed through a visual exam. Other exams or tests may also be done, such as:  Examination of the rectal area with a gloved hand (digital rectal exam).  Examination of the anal canal using a small tube (anoscope).  A blood test, if you have lost a significant amount of blood.  A test to look inside the colon (sigmoidoscopy or colonoscopy).  How is this treated? This condition can usually be treated at home. However, various procedures may be done if dietary changes, lifestyle changes, and other home treatments do not help your symptoms. These procedures can help make the hemorrhoids smaller or remove them completely. Some of these procedures involve  surgery, and others do not. Common procedures include:  Rubber band ligation. Rubber bands are placed at the base of the hemorrhoids to cut off the blood supply to them.  Sclerotherapy. Medicine is injected into the hemorrhoids to shrink them.  Infrared coagulation. A type of light energy is used to get rid of the hemorrhoids.  Hemorrhoidectomy surgery. The hemorrhoids are surgically removed, and the veins that supply them are tied off.  Stapled hemorrhoidopexy surgery. A circular stapling device is used to remove the hemorrhoids and use staples to cut off the blood supply to them.  Follow these instructions at home: Eating and drinking  Eat foods that have a lot of fiber in them, such as whole grains, beans, nuts, fruits, and vegetables. Ask your health care provider about taking products that have added fiber (fiber supplements).  Drink enough fluid to keep your urine clear or pale yellow. Managing pain and swelling  Take warm sitz baths for 20 minutes, 3-4 times a day to ease pain and discomfort.  If directed, apply ice to the affected area. Using ice packs between sitz baths may be helpful. ? Put ice in a plastic bag. ? Place a towel between your skin and the bag. ? Leave the ice on for 20 minutes, 2-3 times a day. General instructions  Take over-the-counter and prescription medicines only as told by your health care provider.  Use medicated creams or suppositories as told.  Exercise regularly.  Go to the bathroom when you have the urge to have a bowel movement. Do not wait.    Avoid straining to have bowel movements.  Keep the anal area dry and clean. Use wet toilet paper or moist towelettes after a bowel movement.  Do not sit on the toilet for long periods of time. This increases blood pooling and pain. Contact a health care provider if:  You have increasing pain and swelling that are not controlled by treatment or medicine.  You have uncontrolled bleeding.  You  have difficulty having a bowel movement, or you are unable to have a bowel movement.  You have pain or inflammation outside the area of the hemorrhoids. This information is not intended to replace advice given to you by your health care provider. Make sure you discuss any questions you have with your health care provider. Document Released: 09/03/2000 Document Revised: 02/04/2016 Document Reviewed: 05/21/2015 Elsevier Interactive Patient Education  2017 [employer]. Nonsurgical Procedures for Hemorrhoids Nonsurgical procedures can be used to treat hemorrhoids. Hemorrhoids are swollen veins that are inside the rectum (internal hemorrhoids) or around the anus (external hemorrhoids). They are caused by increased pressure in the anal area. This pressure may result from straining to have a bowel movement (constipation), diarrhea, pregnancy, obesity, anal sex, or sitting for long periods of time. Hemorrhoids can cause symptoms such as pain and bleeding. Various procedures may be performed if diet changes, lifestyle changes, and other treatments do not help your symptoms. Some of these procedures do not involve surgery. Three common nonsurgical procedures are:  Rubber band ligation. Rubber bands are used to cut off the blood supply to the hemorrhoids.  Sclerotherapy. Medicine is injected into the hemorrhoids to shrink them.  Infrared coagulation. A type of light energy is used to get rid of the hemorrhoids.  Tell a health care provider about:  Any allergies you have.  All medicines you are taking, including vitamins, herbs, eye drops, creams, and over-the-counter medicines.  Any problems you or family members have had with anesthetic medicines.  Any blood disorders you have.  Any surgeries you have had.  Any medical conditions you have.  Whether you are pregnant or may be pregnant. What are the risks? Generally, this is a safe procedure. However, problems may occur,  including:  Infection.  Bleeding.  Pain.  What happens before the procedure?  Ask your health care provider about: ? Changing or stopping your regular medicines. This is especially important if you are taking diabetes medicines or blood thinners. ? Taking medicines such as aspirin and ibuprofen. These medicines can thin your blood. Do not take these medicines before your procedure if your health care provider instructs you not to.  You may need to have a procedure to examine the inside of your colon with a scope (colonoscopy). Your health care provider may do this to make sure that there are no other causes for your bleeding or pain. What happens during the procedure?  Your health care provider will clean your rectal area with a rinsing solution.  A lubricating jelly may be placed into your rectum. The jelly may contain a medicine to numb the area (local anesthetic).  Your health care provider will insert a short scope (anoscope) into your rectum to examine the hemorrhoids.  One of the following techniques will be used. Rubber Band Ligation Your health care provider will place medical instruments through the scope to put rubber bands around the base of your hemorrhoids. The bands will cut off the blood supply to the hemorrhoids. The hemorrhoids will fall off after several days. Sclerotherapy Your health care provider  will inject medicine through the scope into your hemorrhoids. This will cause them to shrink and dry up. Infrared Coagulation Your health care provider will shine a type of light through the scope onto your hemorrhoids. This light will generate energy (infrared radiation). It will cause the hemorrhoids to scar and then fall off. Each of these procedures may vary among health care providers and hospitals. What happens after the procedure?  You will be monitored to make sure that you have no bleeding.  Return to your normal activities as told by your health care  provider. This information is not intended to replace advice given to you by your health care provider. Make sure you discuss any questions you have with your health care provider. Document Released: 07/04/2009 Document Revised: 02/12/2016 Document Reviewed: 12/02/2014 Elsevier Interactive Patient Education  [employer].

## 2017-03-04 ENCOUNTER — Ambulatory Visit (INDEPENDENT_AMBULATORY_CARE_PROVIDER_SITE_OTHER): Payer: Federal, State, Local not specified - PPO | Admitting: Physician Assistant

## 2017-03-04 ENCOUNTER — Encounter: Payer: Self-pay | Admitting: Physician Assistant

## 2017-03-04 DIAGNOSIS — N750 Cyst of Bartholin's gland: Secondary | ICD-10-CM | POA: Diagnosis not present

## 2017-03-04 DIAGNOSIS — B373 Candidiasis of vulva and vagina: Secondary | ICD-10-CM

## 2017-03-04 DIAGNOSIS — B3731 Acute candidiasis of vulva and vagina: Secondary | ICD-10-CM

## 2017-03-04 DIAGNOSIS — K640 First degree hemorrhoids: Secondary | ICD-10-CM | POA: Diagnosis not present

## 2017-03-04 MED ORDER — TERCONAZOLE 0.4 % VA CREA: 1.0000 | TOPICAL_CREAM | Freq: Every day | VAGINAL | 0 refills | Status: DC

## 2017-03-04 MED ORDER — DOXYCYCLINE HYCLATE 100 MG PO TABS: 100.0000 mg | ORAL_TABLET | Freq: Two times a day (BID) | ORAL | 0 refills | Status: DC

## 2017-03-04 NOTE — Patient Instructions (Signed)
Bartholin Cyst or Abscess A Bartholin cyst is a fluid-filled sac that forms on a Bartholin gland. Bartholin glands are small glands that are found in the folds of skin (labia) on the sides of the lower opening of the vagina. This type of cyst causes a bulge on the side of the vagina. A cyst that is not large or infected may not cause problems. However, if the fluid in the cyst becomes infected, the cyst can turn into an abscess. An abscess may cause discomfort or pain. Follow these instructions at home:  Take medicines only as told by your doctor.  If you were prescribed an antibiotic medicine, finish all of it even if you start to feel better.  Apply warm, wet compresses to the area or take warm, shallow baths that cover your pelvic area (sitz baths). Do this many times each day or as told by your doctor.  Do not squeeze the cyst. Do not apply heavy pressure to it.  Do not have sex until the cyst has gone away.  If your cyst or abscess was opened by your doctor, a small piece of gauze or a drain may have been placed in the area. That lets the cyst drain. Do not remove the gauze or the drain until your doctor tells you it is okay to do that.  Do not wear tampons. Wear feminine pads as needed for any fluid or blood.  Keep all follow-up visits as told by your doctor. This is important. Contact a doctor if:  Your pain, puffiness (swelling), or redness in the area of the cyst gets worse.  You have fluid or pus pus coming from the cyst.  You have a fever. This information is not intended to replace advice given to you by your health care provider. Make sure you discuss any questions you have with your health care provider. Document Released: 12/03/2008 Document Revised: 02/12/2016 Document Reviewed: 04/22/2014 Elsevier Interactive Patient Education  2018 [employer]. Vaginitis Vaginitis is an inflammation of the vagina. It can happen when the normal bacteria and yeast in the vagina grow  too much. There are different types. Treatment will depend on the type you have. Follow these instructions at home:  Take all medicines as told by your doctor.  Keep your vagina area clean and dry. Avoid soap. Rinse the area with water.  Avoid washing and cleaning out the vagina (douching).  Do not use tampons or have sex (intercourse) until your treatment is done.  Wipe from front to back after going to the restroom.  Wear cotton underwear.  Avoid wearing underwear while you sleep until your vaginitis is gone.  Avoid tight pants. Avoid underwear or nylons without a cotton panel.  Take off wet clothing (such as a bathing suit) as soon as you can.  Use mild, unscented products. Avoid fabric softeners and scented: ? Feminine sprays. ? Laundry detergents. ? Tampons. ? Soaps or bubble baths.  Practice safe sex and use condoms. Get help right away if:  You have belly (abdominal) pain.  You have a fever or lasting symptoms for more than 2-3 days.  You have a fever and your symptoms suddenly get worse. This information is not intended to replace advice given to you by your health care provider. Make sure you discuss any questions you have with your health care provider. Document Released: 12/03/2008 Document Revised: 02/12/2016 Document Reviewed: 02/17/2012 Elsevier Interactive Patient Education  2017 [employer].

## 2017-03-04 NOTE — Progress Notes (Signed)
Patient: Virginia Woods Female    DOB: 01/11/1971   46 y.o.   MRN: [id number] Visit Date: 03/04/2017  Today's Provider: Mar Daring, PA-C   Chief Complaint  Patient presents with  . Hemorrhoids   Subjective:    HPI Patient here today C/O of persistent itching and burning from hemorrhoids. Patient was seen on 03/02/17 and was started on ANUSOL-HC 25 mg suppository. She is still not having pain only intense itching.   She reports having increased itching around the labia and irritation through the gluteal muscles.   She also has a complaint of a sore "knot" on the right labia that has been present for a while now, but just recently started becoming more tender to the touch.      No Known Allergies   Current Outpatient Prescriptions:  .  ALPRAZolam (XANAX) 0.5 MG tablet, TAKE ONE TABLET BY MOUTH TWICE DAILY AS NEEDED, Disp: 60 tablet, Rfl: 0 .  citalopram (CELEXA) 40 MG tablet, TAKE ONE TABLET BY MOUTH ONCE DAILY, Disp: 90 tablet, Rfl: 1 .  cyclobenzaprine (FLEXERIL) 10 MG tablet, Take 10 mg by mouth 3 (three) times daily as needed for muscle spasms., Disp: , Rfl:  .  gabapentin (NEURONTIN) 300 MG capsule, Take 300 mg by mouth 2 (two) times daily. , Disp: , Rfl:  .  hydrocortisone (ANUSOL-HC) 25 MG suppository, Place 1 suppository (25 mg total) rectally 2 (two) times daily., Disp: 12 suppository, Rfl: 0 .  ibuprofen (ADVIL,MOTRIN) 600 MG tablet, Take 1 tablet (600 mg total) by mouth every 6 (six) hours as needed., Disp: 30 tablet, Rfl: 0 .  loratadine (CLARITIN) 10 MG tablet, Take 10 mg by mouth daily as needed. , Disp: , Rfl:  .  OMEGA-3 FATTY ACIDS PO, , Disp: , Rfl:  .  Vitamin D, Ergocalciferol, (DRISDOL) 50000 units CAPS capsule, TAKE ONE CAPSULE BY MOUTH VERY 7 DAYS, Disp: 12 capsule, Rfl: 1 .  LORazepam (ATIVAN) 1 MG tablet, Take 1 tablet (1 mg total) by mouth 2 (two) times daily as needed (muscle relaxation). (Patient not taking: Reported on 03/02/2017), Disp:  10 tablet, Rfl: 0  Review of Systems  Constitutional: Negative.   Respiratory: Negative.   Cardiovascular: Negative.   Gastrointestinal: Positive for rectal pain (not pain, but itching and burning and more on surrounding tissues than at hemorrhoid).       Itching and burning  Genitourinary: Positive for vaginal pain (vaginal itching). Negative for flank pain, frequency, vaginal bleeding and vaginal discharge.    Social History  Substance Use Topics  . Smoking status: Never Smoker  . Smokeless tobacco: Never Used  . Alcohol use No   Objective:   LMP 02/22/2017 (Exact Date)  There were no vitals filed for this visit.   Physical Exam  Constitutional: She appears well-developed and well-nourished. No distress.  Genitourinary: Rectal exam shows internal hemorrhoid (small hemorrhoid still present but soft and smaller than previous). Rectal exam shows no fissure.    There is rash and tenderness on the right labia. There is no lesion or injury on the right labia. There is rash and tenderness on the left labia. There is no lesion or injury on the left labia. There is erythema and tenderness in the vagina.  Skin: She is not diaphoretic.  Vitals reviewed.       Assessment & Plan:     1. Vulvovaginitis due to yeast Will give Terazol cream as below. Advised patient to mix with  hydrocortisone cream since she is so irritated. Cut back on Sitz baths and do not scratch with a wash cloth as this is irritating her more. She is to call if symptoms fail to improve.  - terconazole (TERAZOL 7) 0.4 % vaginal cream; Place 1 applicator vaginally at bedtime.  Dispense: 45 g; Refill: 0  2. Grade I hemorrhoids Improving with treatment. Smaller in size and soft to touch now. Still not painful.   3. Bartholin cyst New bartholin cyst that is becoming larger and more tender. Will treat with doxycycline as below.  - doxycycline (VIBRA-TABS) 100 MG tablet; Take 1 tablet (100 mg total) by mouth 2 (two)  times daily.  Dispense: 14 tablet; Refill: 0       Mar Daring, PA-C  [facility]

## 2017-03-07 ENCOUNTER — Telehealth: Payer: Self-pay | Admitting: Physician Assistant

## 2017-03-07 DIAGNOSIS — B379 Candidiasis, unspecified: Secondary | ICD-10-CM

## 2017-03-07 DIAGNOSIS — A6004 Herpesviral vulvovaginitis: Secondary | ICD-10-CM

## 2017-03-07 DIAGNOSIS — L299 Pruritus, unspecified: Secondary | ICD-10-CM

## 2017-03-07 MED ORDER — HYDROXYZINE HCL 25 MG PO TABS: 25.0000 mg | ORAL_TABLET | Freq: Three times a day (TID) | ORAL | 0 refills | Status: DC | PRN

## 2017-03-07 MED ORDER — FLUCONAZOLE 150 MG PO TABS: 150.0000 mg | ORAL_TABLET | Freq: Every day | ORAL | 0 refills | Status: DC

## 2017-03-07 MED ORDER — VALACYCLOVIR HCL 500 MG PO TABS: 500.0000 mg | ORAL_TABLET | Freq: Two times a day (BID) | ORAL | 3 refills | Status: DC

## 2017-03-07 NOTE — Telephone Encounter (Signed)
Virginia Woods As I was giving the patient the instructions she stated she wanted to talk directly to you.  I told her I would send a message.  Sorry, thanks [employer]

## 2017-03-07 NOTE — Telephone Encounter (Signed)
Called and spoke with patient. She reports that her husband has been tested positive for HSV 2 many years ago and she has known that he has this. She has never had an outbreak before over the 8 years they have been together. Being that they have been having some marital issues and she has been under a lot of stress she has now developed vesicles along the groin where the redness was on Friday. Her husband told her that it looks like his herpes when he has broken out in the past. She is requesting an antiviral. This was sent to [facility].

## 2017-03-07 NOTE — Telephone Encounter (Signed)
Pt states she was seen on Friday.  Pt states she needs to speak with Tawanna Sat about treatment.  Pt states she is not feeling any better.  QV#[phone]

## 2017-03-07 NOTE — Telephone Encounter (Signed)
Tell her I will send in diflucan for her to try and hydroxyzine for itching. Hydroxyzine will make her sleepy.   She may need to cut back on sitz baths as this may be furthering her irritation. Try to stick with warm compresses

## 2017-03-07 NOTE — Telephone Encounter (Signed)
Please review. Thanks!  

## 2017-03-17 ENCOUNTER — Encounter: Payer: Self-pay | Admitting: Physician Assistant

## 2017-03-17 ENCOUNTER — Ambulatory Visit (INDEPENDENT_AMBULATORY_CARE_PROVIDER_SITE_OTHER): Payer: Federal, State, Local not specified - PPO | Admitting: Physician Assistant

## 2017-03-17 VITALS — BP 118/86 | HR 88 | Temp 98.3°F | Wt 206.2 lb

## 2017-03-17 DIAGNOSIS — A6004 Herpesviral vulvovaginitis: Secondary | ICD-10-CM | POA: Diagnosis not present

## 2017-03-17 DIAGNOSIS — B379 Candidiasis, unspecified: Secondary | ICD-10-CM

## 2017-03-17 MED ORDER — FLUCONAZOLE 150 MG PO TABS: 150.0000 mg | ORAL_TABLET | Freq: Every day | ORAL | 0 refills | Status: DC

## 2017-03-17 NOTE — Patient Instructions (Signed)
Fluconazole tablets °What is this medicine? °FLUCONAZOLE (floo KON na zole) is an antifungal medicine. It is used to treat certain kinds of fungal or yeast infections. °This medicine may be used for other purposes; ask your health care provider or pharmacist if you have questions. °COMMON BRAND NAME(S): Diflucan °What should I tell my health care provider before I take this medicine? °They need to know if you have any of these conditions: °-history of irregular heart beat °-kidney disease °-an unusual or allergic reaction to fluconazole, other azole antifungals, medicines, foods, dyes, or preservatives °-pregnant or trying to get pregnant °-breast-feeding °How should I use this medicine? °Take this medicine by mouth. Follow the directions on the prescription label. Do not take your medicine more often than directed. °Talk to your pediatrician regarding the use of this medicine in children. Special care may be needed. This medicine has been used in children as young as 6 months of age. °Overdosage: If you think you have taken too much of this medicine contact a poison control center or emergency room at once. °NOTE: This medicine is only for you. Do not share this medicine with others. °What if I miss a dose? °If you miss a dose, take it as soon as you can. If it is almost time for your next dose, take only that dose. Do not take double or extra doses. °What may interact with this medicine? °Do not take this medicine with any of the following medications: °-astemizole °-certain medicines for irregular heart beat like dofetilide, dronedarone, quinidine °-cisapride °-erythromycin °-lomitapide °-other medicines that prolong the QT interval (cause an abnormal heart rhythm) °-pimozide °-terfenadine °-thioridazine °-tolvaptan °-ziprasidone °This medicine may also interact with the following medications: °-antiviral medicines for HIV or AIDS °-birth control pills °-certain antibiotics like rifabutin, rifampin °-certain  medicines for blood pressure like amlodipine, isradipine, felodipine, hydrochlorothiazide, losartan, nifedipine °-certain medicines for cancer like cyclophosphamide, vinblastine, vincristine °-certain medicines for cholesterol like atorvastatin, lovastatin, fluvastatin, simvastatin °-certain medicines for depression, anxiety, or psychotic disturbances like amitriptyline, midazolam, nortriptyline, triazolam °-certain medicines for diabetes like glipizide, glyburide, tolbutamide °-certain medicines for pain like alfentanil, fentanyl, methadone °-certain medicines for seizures like carbamazepine, phenytoin °-certain medicines that treat or prevent blood clots like warfarin °-halofantrine °-medicines that lower your chance of fighting infection like cyclosporine, prednisone, tacrolimus °-NSAIDS, medicines for pain and inflammation, like celecoxib, diclofenac, flurbiprofen, ibuprofen, meloxicam, naproxen °-other medicines for fungal infections °-sirolimus °-theophylline °-tofacitinib °This list may not describe all possible interactions. Give your health care provider a list of all the medicines, herbs, non-prescription drugs, or dietary supplements you use. Also tell them if you smoke, drink alcohol, or use illegal drugs. Some items may interact with your medicine. °What should I watch for while using this medicine? °Visit your doctor or health care professional for regular checkups. If you are taking this medicine for a long time you may need blood work. Tell your doctor if your symptoms do not improve. Some fungal infections need many weeks or months of treatment to cure. °Alcohol can increase possible damage to your liver. Avoid alcoholic drinks. °If you have a vaginal infection, do not have sex until you have finished your treatment. You can wear a sanitary napkin. Do not use tampons. Wear freshly washed cotton, not synthetic, panties. °What side effects may I notice from receiving this medicine? °Side effects that  you should report to your doctor or health care professional as soon as possible: °-allergic reactions like skin rash or itching, hives, swelling of the   lips, mouth, tongue, or throat °-dark urine °-feeling dizzy or faint °-irregular heartbeat or chest pain °-redness, blistering, peeling or loosening of the skin, including inside the mouth °-trouble breathing °-unusual bruising or bleeding °-vomiting °-yellowing of the eyes or skin °Side effects that usually do not require medical attention (report to your doctor or health care professional if they continue or are bothersome): °-changes in how food tastes °-diarrhea °-headache °-stomach upset or nausea °This list may not describe all possible side effects. Call your doctor for medical advice about side effects. You may report side effects to FDA at 1-800-FDA-1088. °Where should I keep my medicine? °Keep out of the reach of children. °Store at room temperature below 30 degrees C (86 degrees F). Throw away any medicine after the expiration date. °NOTE: This sheet is a summary. It may not cover all possible information. If you have questions about this medicine, talk to your doctor, pharmacist, or health care provider. °© 2018 Elsevier/Gold Standard (2013-04-14 19:37:38) ° °

## 2017-03-17 NOTE — Progress Notes (Signed)
Patient: Virginia Woods Female    DOB: [birth year]   46 y.o.   MRN: [id number] Visit Date: 03/17/2017  Today's Provider: Mar Daring, PA-C   Chief Complaint  Patient presents with  . Allergic Reaction   Subjective:    Allergic Reaction  This is a new problem. The current episode started 2 days ago. The problem occurs constantly. The problem is unchanged. The patient was exposed to an antibiotic (doxycycline (VIBRA-TABS) 100 MG tablet on 03/04/2017). Associated symptoms include itching and a rash. Pertinent negatives include no difficulty breathing or trouble swallowing. There is no swelling present. Past treatments include one or more OTC medications (Hydrocortisone and Benadryl cream). The treatment provided no relief.   Patient took one day of Doxycycline and developed itching and hive-like rash. Stopped antibiotic and took benadryl. Symptoms have resolved. She has been doing epsom salt soaks and warm compresses for the bartholin cyst and reports it is now smaller.   She also reports that she never started the valtrex as symptoms started improving after the vesicles appeared. She did pick up Rx and has on hand in case symptoms return. She still has some vaginal itching but it is lessening. She reports that she has been using the Terazol cream as well with minimal symptom improvement but not complete resolution. Vaginal discharge has lessened.   Previous Medications   ALPRAZOLAM (XANAX) 0.5 MG TABLET    TAKE ONE TABLET BY MOUTH TWICE DAILY AS NEEDED   CITALOPRAM (CELEXA) 40 MG TABLET    TAKE ONE TABLET BY MOUTH ONCE DAILY   CYCLOBENZAPRINE (FLEXERIL) 10 MG TABLET    Take 10 mg by mouth 3 (three) times daily as needed for muscle spasms.   GABAPENTIN (NEURONTIN) 300 MG CAPSULE    Take 300 mg by mouth 2 (two) times daily.    HYDROCORTISONE (ANUSOL-HC) 25 MG SUPPOSITORY    Place 1 suppository (25 mg total) rectally 2 (two) times daily.   IBUPROFEN (ADVIL,MOTRIN) 600 MG TABLET    Take  1 tablet (600 mg total) by mouth every 6 (six) hours as needed.   LORATADINE (CLARITIN) 10 MG TABLET    Take 10 mg by mouth daily as needed.    LORAZEPAM (ATIVAN) 1 MG TABLET    Take 1 tablet (1 mg total) by mouth 2 (two) times daily as needed (muscle relaxation).   OMEGA-3 FATTY ACIDS PO       TERCONAZOLE (TERAZOL 7) 0.4 % VAGINAL CREAM    Place 1 applicator vaginally at bedtime.   VALACYCLOVIR (VALTREX) 500 MG TABLET    Take 1 tablet (500 mg total) by mouth 2 (two) times daily.   VITAMIN D, ERGOCALCIFEROL, (DRISDOL) 50000 UNITS CAPS CAPSULE    TAKE ONE CAPSULE BY MOUTH VERY 7 DAYS    Review of Systems  Constitutional: Negative.   HENT: Negative for trouble swallowing.   Respiratory: Negative.   Cardiovascular: Negative.   Gastrointestinal: Negative.   Genitourinary: Positive for vaginal pain (itching, not pain). Negative for dyspareunia, dysuria, flank pain, frequency, genital sores, menstrual problem, vaginal bleeding and vaginal discharge.       Vaginal itching   Skin: Positive for itching and rash.       Itching     Social History  Substance Use Topics  . Smoking status: Never Smoker  . Smokeless tobacco: Never Used  . Alcohol use No   Objective:   BP 118/86 (BP Location: Right Arm, Patient Position: Sitting, Cuff Size: Normal)   Pulse  88   Temp 98.3 F (36.8 C) (Oral)   Wt 206 lb 3.2 oz (93.5 kg)   LMP 02/22/2017 (Exact Date)   SpO2 99%   BMI 34.31 kg/m   Physical Exam  Constitutional: She appears well-developed and well-nourished. No distress.  Cardiovascular: Normal rate, regular rhythm and normal heart sounds.  Exam reveals no gallop and no friction rub.   No murmur heard. Pulmonary/Chest: Effort normal and breath sounds normal. No respiratory distress. She has no wheezes. She has no rales.  Genitourinary: Rectal exam shows external hemorrhoid. There is no rash, tenderness, lesion or injury on the right labia. There is no rash, tenderness, lesion or injury on the  left labia. No erythema or tenderness in the vagina. No vaginal discharge found.  Genitourinary Comments: Vaginal and labial erythema is now improved. No vesicles seen. Bartholin cyst on right has decreased in size and is no longer tender to palpation  Skin: She is not diaphoretic.  Vitals reviewed.      Assessment & Plan:     1. Yeast infection Since she is still having some vaginal itching will give diflucan for her to take a loading dose of 1 tablet PO daily x 3 days. She may stop terazol cream as it is not clearing completely. She is to call if symptoms persist and we will collect NuSwab to see if it is a resistant yeast infection. May cut back on Sitz baths and warm compresses to reduce irritation.  - fluconazole (DIFLUCAN) 150 MG tablet; Take 1 tablet (150 mg total) by mouth daily.  Dispense: 3 tablet; Refill: 0  2. Herpes simplex vulvovaginitis Currently better. Will see if patient has another flare. She has valtrex on hand to start at beginning of symptoms if they return.   Follow up: No Follow-up on file.

## 2017-03-21 ENCOUNTER — Encounter: Payer: Self-pay | Admitting: Physician Assistant

## 2017-03-21 ENCOUNTER — Ambulatory Visit (INDEPENDENT_AMBULATORY_CARE_PROVIDER_SITE_OTHER): Payer: Federal, State, Local not specified - PPO | Admitting: Physician Assistant

## 2017-03-21 VITALS — BP 114/72 | HR 84 | Temp 98.6°F | Resp 16 | Wt 203.0 lb

## 2017-03-21 DIAGNOSIS — L299 Pruritus, unspecified: Secondary | ICD-10-CM

## 2017-03-21 DIAGNOSIS — M25542 Pain in joints of left hand: Secondary | ICD-10-CM

## 2017-03-21 DIAGNOSIS — M25541 Pain in joints of right hand: Secondary | ICD-10-CM | POA: Diagnosis not present

## 2017-03-21 MED ORDER — PREDNISONE 10 MG (21) PO TBPK: ORAL_TABLET | ORAL | 0 refills | Status: DC

## 2017-03-21 NOTE — Patient Instructions (Signed)
Pruritus  Pruritus is an itching feeling. There are many different conditions and factors that can make your skin itchy. Dry skin is one of the most common causes of itching. Most cases of itching do not require medical attention. Itchy skin can turn into a rash.  Follow these instructions at home:  Watch your pruritus for any changes. Take these steps to help with your condition:  Skin Care  · Moisturize your skin as needed. A moisturizer that contains petroleum jelly is best for keeping moisture in your skin.  · Take or apply medicines only as directed by your health care provider. This may include:  ? Corticosteroid cream.  ? Anti-itch lotions.  ? Oral anti-histamines.  · Apply cool compresses to the affected areas.  · Try taking a bath with:  ? Epsom salts. Follow the instructions on the packaging. You can get these at your local pharmacy or grocery store.  ? Baking soda. Pour a small amount into the bath as directed by your health care provider.  ? Colloidal oatmeal. Follow the instructions on the packaging. You can get this at your local pharmacy or grocery store.  · Try applying baking soda paste to your skin. Stir water into baking soda until it reaches a paste-like consistency.  · Do not scratch your skin.  · Avoid hot showers or baths, which can make itching worse. A cold shower may help with itching as long as you use a moisturizer after.  · Avoid scented soaps, detergents, and perfumes. Use gentle soaps, detergents, perfumes, and other cosmetic products.  General instructions  · Avoid wearing tight clothes.  · Keep a journal to help track what causes your itch. Write down:  ? What you eat.  ? What cosmetic products you use.  ? What you drink.  ? What you wear. This includes jewelry.  · Use a humidifier. This keeps the air moist, which helps to prevent dry skin.  Contact a health care provider if:  · The itching does not go away after several days.  · You sweat at night.  · You have weight loss.  · You  are unusually thirsty.  · You urinate more than normal.  · You are more tired than normal.  · You have abdominal pain.  · Your skin tingles.  · You feel weak.  · Your skin or the whites of your eyes look yellow (jaundice).  · Your skin feels numb.  This information is not intended to replace advice given to you by your health care provider. Make sure you discuss any questions you have with your health care provider.  Document Released: 05/19/2011 Document Revised: 02/12/2016 Document Reviewed: 09/02/2014  Elsevier Interactive Patient Education © 2018 Elsevier Inc[employer]

## 2017-03-21 NOTE — Progress Notes (Signed)
Patient: Virginia Woods Female    DOB: 18-Aug-1971   46 y.o.   MRN: [id number] Visit Date: 03/21/2017  Today's Provider: Mar Daring, PA-C   Chief Complaint  Patient presents with  . Rash    On face started Saturday.   Subjective:    Rash  This is a new problem. The current episode started in the past 7 days. The problem has been rapidly improving (Pt says the rash is better, but is still very itchy.) since onset. The affected locations include the face and right elbow. The rash is characterized by itchiness and redness. She was exposed to nothing. Associated symptoms include fatigue. Pertinent negatives include no congestion, fever or sore throat (Pt says her throat doesn't hurt but feels tight.). Past treatments include antibiotic cream. The treatment provided mild relief.   She did bring photos with her that showed a pimple on her right cheek that developed on Saturday. She attempted to pop the pimple Saturday and then Sunday when she awoke she had an area of red swelling of the right lower cheek, jaw and right upper lip. She has been applying hydrocortisone cream and taking benadryl. Rash has cleared. Some swelling remains in right upper lip only.     Allergies  Allergen Reactions  . Doxycycline Itching and Rash     Current Outpatient Prescriptions:  .  ALPRAZolam (XANAX) 0.5 MG tablet, TAKE ONE TABLET BY MOUTH TWICE DAILY AS NEEDED, Disp: 60 tablet, Rfl: 0 .  citalopram (CELEXA) 40 MG tablet, TAKE ONE TABLET BY MOUTH ONCE DAILY, Disp: 90 tablet, Rfl: 1 .  cyclobenzaprine (FLEXERIL) 10 MG tablet, Take 10 mg by mouth 3 (three) times daily as needed for muscle spasms., Disp: , Rfl:  .  gabapentin (NEURONTIN) 300 MG capsule, Take 300 mg by mouth 2 (two) times daily. , Disp: , Rfl:  .  hydrocortisone (ANUSOL-HC) 25 MG suppository, Place 1 suppository (25 mg total) rectally 2 (two) times daily., Disp: 12 suppository, Rfl: 0 .  ibuprofen (ADVIL,MOTRIN) 600 MG tablet,  Take 1 tablet (600 mg total) by mouth every 6 (six) hours as needed., Disp: 30 tablet, Rfl: 0 .  loratadine (CLARITIN) 10 MG tablet, Take 10 mg by mouth daily as needed. , Disp: , Rfl:  .  OMEGA-3 FATTY ACIDS PO, , Disp: , Rfl:  .  valACYclovir (VALTREX) 500 MG tablet, Take 1 tablet (500 mg total) by mouth 2 (two) times daily., Disp: 14 tablet, Rfl: 3 .  Vitamin D, Ergocalciferol, (DRISDOL) 50000 units CAPS capsule, TAKE ONE CAPSULE BY MOUTH VERY 7 DAYS, Disp: 12 capsule, Rfl: 1 .  fluconazole (DIFLUCAN) 150 MG tablet, Take 1 tablet (150 mg total) by mouth daily. (Patient not taking: Reported on 03/21/2017), Disp: 3 tablet, Rfl: 0 .  LORazepam (ATIVAN) 1 MG tablet, Take 1 tablet (1 mg total) by mouth 2 (two) times daily as needed (muscle relaxation)., Disp: 10 tablet, Rfl: 0 .  terconazole (TERAZOL 7) 0.4 % vaginal cream, Place 1 applicator vaginally at bedtime. (Patient not taking: Reported on 03/21/2017), Disp: 45 g, Rfl: 0  Review of Systems  Constitutional: Positive for fatigue. Negative for activity change, appetite change, chills, diaphoresis, fever and unexpected weight change.  HENT: Positive for facial swelling (improving) and trouble swallowing. Negative for congestion, ear pain, sinus pain, sinus pressure, sneezing, sore throat (Pt says her throat doesn't hurt but feels tight.), tinnitus and voice change.   Respiratory: Negative.   Cardiovascular: Negative.  Gastrointestinal: Negative.   Musculoskeletal: Positive for arthralgias. Negative for back pain, gait problem, joint swelling, myalgias, neck pain and neck stiffness.       History of fibromyalgia, pt reports this is a "different joint pain."  In hands only  Skin: Positive for rash.  Neurological: Negative for dizziness, light-headedness and headaches.    Social History  Substance Use Topics  . Smoking status: Never Smoker  . Smokeless tobacco: Never Used  . Alcohol use No   Objective:   BP 114/72 (BP Location: Left Arm,  Patient Position: Sitting, Cuff Size: Large)   Pulse 84   Temp 98.6 F (37 C) (Oral)   Resp 16   Wt 203 lb (92.1 kg)   LMP 02/22/2017 (Exact Date)   BMI 33.78 kg/m  Vitals:   03/21/17 0859  BP: 114/72  Pulse: 84  Resp: 16  Temp: 98.6 F (37 C)  TempSrc: Oral  Weight: 203 lb (92.1 kg)     Physical Exam  Constitutional: She appears well-developed and well-nourished. No distress.  HENT:  Head: Normocephalic and atraumatic.    Nose: Nose normal.  Mouth/Throat: Uvula is midline, oropharynx is clear and moist and mucous membranes are normal.  Neck: Normal range of motion. Neck supple. No tracheal deviation present. No thyromegaly present.  Cardiovascular: Normal rate, regular rhythm and normal heart sounds.  Exam reveals no gallop and no friction rub.   No murmur heard. Pulmonary/Chest: Effort normal and breath sounds normal. No respiratory distress. She has no wheezes. She has no rales.  Lymphadenopathy:    She has no cervical adenopathy.  Skin: She is not diaphoretic.  Vitals reviewed.       Assessment & Plan:     1. Itching Unsure of cause but seems like a dermatitis that is now improving with hydrocortisone cream and benadryl. Patient is requesting labs to make sure everything is ok and that there is not another cause for her current symptoms. This is reasonable and labs were pulled as below. I will f/u pending lab results. Prednisone 6 day taper given for continued itching and hand arthralgias. She is to call if still no improvement.  - CBC w/Diff/Platelet - Comprehensive Metabolic Panel (CMET) - TSH - predniSONE (STERAPRED UNI-PAK 21 TAB) 10 MG (21) TBPK tablet; Take as directed on package instructions  Dispense: 21 tablet; Refill: 0  2. Arthralgia of both hands See above medical treatment plan. - CBC w/Diff/Platelet - Comprehensive Metabolic Panel (CMET) - TSH - predniSONE (STERAPRED UNI-PAK 21 TAB) 10 MG (21) TBPK tablet; Take as directed on package  instructions  Dispense: 21 tablet; Refill: 0       Mar Daring, PA-C  [facility]

## 2017-03-22 ENCOUNTER — Telehealth: Payer: Self-pay

## 2017-03-22 LAB — CBC WITH DIFFERENTIAL/PLATELET
Basophils Absolute: 0.1 10*3/uL (ref 0.0–0.2)
Basos: 1 %
EOS (ABSOLUTE): 0.8 10*3/uL — ABNORMAL HIGH (ref 0.0–0.4)
EOS: 16 %
HEMATOCRIT: 39.2 % (ref 34.0–46.6)
Hemoglobin: 12.2 g/dL (ref 11.1–15.9)
Immature Grans (Abs): 0 10*3/uL (ref 0.0–0.1)
Immature Granulocytes: 0 %
LYMPHS ABS: 1.9 10*3/uL (ref 0.7–3.1)
Lymphs: 36 %
MCH: 22.7 pg — AB (ref 26.6–33.0)
MCHC: 31.1 g/dL — AB (ref 31.5–35.7)
MCV: 73 fL — ABNORMAL LOW (ref 79–97)
MONOCYTES: 7 %
Monocytes Absolute: 0.4 10*3/uL (ref 0.1–0.9)
NEUTROS ABS: 2 10*3/uL (ref 1.4–7.0)
Neutrophils: 40 %
Platelets: 290 10*3/uL (ref 150–379)
RBC: 5.37 x10E6/uL — ABNORMAL HIGH (ref 3.77–5.28)
RDW: 16.8 % — ABNORMAL HIGH (ref 12.3–15.4)
WBC: 5.2 10*3/uL (ref 3.4–10.8)

## 2017-03-22 LAB — COMPREHENSIVE METABOLIC PANEL
ALK PHOS: 68 IU/L (ref 39–117)
ALT: 15 IU/L (ref 0–32)
AST: 20 IU/L (ref 0–40)
Albumin/Globulin Ratio: 1.6 (ref 1.2–2.2)
Albumin: 4.2 g/dL (ref 3.5–5.5)
BILIRUBIN TOTAL: 0.3 mg/dL (ref 0.0–1.2)
BUN / CREAT RATIO: 10 (ref 9–23)
BUN: 8 mg/dL (ref 6–24)
CO2: 25 mmol/L (ref 20–29)
Calcium: 9.3 mg/dL (ref 8.7–10.2)
Chloride: 102 mmol/L (ref 96–106)
Creatinine, Ser: 0.78 mg/dL (ref 0.57–1.00)
GFR calc Af Amer: 105 mL/min/{1.73_m2} (ref >59)
GFR calc non Af Amer: 91 mL/min/{1.73_m2} (ref >59)
GLOBULIN, TOTAL: 2.6 g/dL (ref 1.5–4.5)
Glucose: 93 mg/dL (ref 65–99)
Potassium: 4.4 mmol/L (ref 3.5–5.2)
SODIUM: 140 mmol/L (ref 134–144)
Total Protein: 6.8 g/dL (ref 6.0–8.5)

## 2017-03-22 LAB — TSH: TSH: 1.06 u[IU]/mL (ref 0.450–4.500)

## 2017-03-22 NOTE — Telephone Encounter (Signed)
Patient advised as below.  

## 2017-03-22 NOTE — Telephone Encounter (Signed)
-----   Message from Mar Daring, PA-C sent at 03/22/2017  8:30 AM EDT ----- All labs are within normal limits and stable.  Thanks! -JB

## 2017-03-30 ENCOUNTER — Encounter: Payer: Medicare Other | Admitting: Physician Assistant

## 2017-04-08 ENCOUNTER — Encounter: Payer: Self-pay | Admitting: Physician Assistant

## 2017-04-08 ENCOUNTER — Ambulatory Visit (INDEPENDENT_AMBULATORY_CARE_PROVIDER_SITE_OTHER): Payer: Federal, State, Local not specified - PPO | Admitting: Physician Assistant

## 2017-04-08 VITALS — BP 130/88 | HR 82 | Temp 98.5°F | Ht 65.0 in | Wt 204.4 lb

## 2017-04-08 DIAGNOSIS — Z1231 Encounter for screening mammogram for malignant neoplasm of breast: Secondary | ICD-10-CM

## 2017-04-08 DIAGNOSIS — R0683 Snoring: Secondary | ICD-10-CM

## 2017-04-08 DIAGNOSIS — E538 Deficiency of other specified B group vitamins: Secondary | ICD-10-CM

## 2017-04-08 DIAGNOSIS — Z136 Encounter for screening for cardiovascular disorders: Secondary | ICD-10-CM

## 2017-04-08 DIAGNOSIS — R7309 Other abnormal glucose: Secondary | ICD-10-CM

## 2017-04-08 DIAGNOSIS — F331 Major depressive disorder, recurrent, moderate: Secondary | ICD-10-CM | POA: Diagnosis not present

## 2017-04-08 DIAGNOSIS — R5383 Other fatigue: Secondary | ICD-10-CM

## 2017-04-08 DIAGNOSIS — E559 Vitamin D deficiency, unspecified: Secondary | ICD-10-CM | POA: Diagnosis not present

## 2017-04-08 DIAGNOSIS — D508 Other iron deficiency anemias: Secondary | ICD-10-CM

## 2017-04-08 DIAGNOSIS — Z6834 Body mass index (BMI) 34.0-34.9, adult: Secondary | ICD-10-CM | POA: Diagnosis not present

## 2017-04-08 DIAGNOSIS — Q0700 Arnold-Chiari syndrome without spina bifida or hydrocephalus: Secondary | ICD-10-CM

## 2017-04-08 DIAGNOSIS — Z13220 Encounter for screening for lipoid disorders: Secondary | ICD-10-CM | POA: Diagnosis not present

## 2017-04-08 DIAGNOSIS — F419 Anxiety disorder, unspecified: Secondary | ICD-10-CM | POA: Diagnosis not present

## 2017-04-08 DIAGNOSIS — Z1239 Encounter for other screening for malignant neoplasm of breast: Secondary | ICD-10-CM

## 2017-04-08 DIAGNOSIS — Z0000 Encounter for general adult medical examination without abnormal findings: Secondary | ICD-10-CM | POA: Diagnosis not present

## 2017-04-08 MED ORDER — ALPRAZOLAM 0.5 MG PO TABS: 0.5000 mg | ORAL_TABLET | Freq: Two times a day (BID) | ORAL | 1 refills | Status: DC | PRN

## 2017-04-08 MED ORDER — VITAMIN D (ERGOCALCIFEROL) 1.25 MG (50000 UNIT) PO CAPS: ORAL_CAPSULE | ORAL | 1 refills | Status: DC

## 2017-04-08 MED ORDER — CITALOPRAM HYDROBROMIDE 40 MG PO TABS: 40.0000 mg | ORAL_TABLET | Freq: Every day | ORAL | 1 refills | Status: DC

## 2017-04-08 NOTE — Progress Notes (Signed)
Patient: Virginia Woods, Female    DOB: 02-21-1971, 46 y.o.   MRN: [id number] Visit Date: 04/08/2017  Today's Provider: Mar Daring, PA-C   Chief Complaint  Patient presents with  . Annual Exam   Subjective:    Annual physical exam Virginia Woods is a 46 y.o. female who presents today for health maintenance and complete physical. She feels fairly well. She reports exercising none. She reports she is sleeping fairly well.  Last CPE:11/04/15 Pap: 11/04/15 Negative Mammogram;09/25/14 BI-RADS 1 Colonoscopy:07/08/11 Diverticulitis -----------------------------------------------------------------   Review of Systems  Constitutional: Negative.   HENT: Negative.   Eyes: Negative.   Respiratory: Negative.   Cardiovascular: Negative.   Gastrointestinal: Negative.   Endocrine: Negative.   Genitourinary: Negative.   Musculoskeletal: Negative.   Skin: Negative.   Allergic/Immunologic: Negative.   Neurological: Negative.   Hematological: Negative.   Psychiatric/Behavioral: Positive for sleep disturbance. The patient is nervous/anxious.     Social History      She  reports that she has never smoked. She has never used smokeless tobacco. She reports that she does not drink alcohol or use drugs.       Social History   Social History  . Marital status: Married    Spouse name: N/A  . Number of children: N/A  . Years of education: N/A   Social History Main Topics  . Smoking status: Never Smoker  . Smokeless tobacco: Never Used  . Alcohol use No  . Drug use: No  . Sexual activity: Not Asked   Other Topics Concern  . None   Social History Narrative  . None    Past Medical History:  Diagnosis Date  . Anemia   . Anxiety   . Depression   . Vitamin B12 deficiency   . Vitamin D deficiency      Patient Active Problem List   Diagnosis Date Noted  . 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 Allergic rhinitis 05/20/2015  . ACM  (Arnold-Chiari malformation) 05/20/2015  . Chronic headache 05/20/2015  . Chronic pain 05/20/2015  . Clinical depression 05/20/2015  . Dermatitis, eczematoid 05/20/2015  . Acute Lyme disease 05/20/2015  . Muscle ache 05/20/2015  . Apnea, sleep 05/20/2015  . B12 deficiency 05/20/2015  . Avitaminosis D 05/20/2015  . Depression 05/13/2015  . Hypo-osmolality and hyponatremia 10/13/2009  . Anemia, iron deficiency 05/16/2009  . Cannot sleep 04/18/2008  . Anxiety 11/05/2004    Past Surgical History:  Procedure Laterality Date  . BRAIN SURGERY    . CESAREAN SECTION  1996  . TUBAL LIGATION  2002    Family History        Family Status  Relation Status  . Mother Alive  . Father Alive       Kidney failure, kidney transplant  . Sister Alive  . Ethlyn Daniels Alive       Brain aneurysm  . MGM Deceased       MI  . PGM Deceased       Brain aneurysm        Her family history includes Cancer in her mother; Heart disease in her maternal grandmother; Hypertension in her mother and sister.     Allergies  Allergen Reactions  . Doxycycline Itching and Rash     Current Outpatient Prescriptions:  .  ALPRAZolam (XANAX) 0.5 MG tablet, TAKE ONE TABLET BY MOUTH TWICE DAILY AS NEEDED, Disp: 60 tablet, Rfl: 0 .  citalopram (CELEXA) 40 MG tablet, TAKE  ONE TABLET BY MOUTH ONCE DAILY, Disp: 90 tablet, Rfl: 1 .  cyclobenzaprine (FLEXERIL) 10 MG tablet, Take 10 mg by mouth 3 (three) times daily as needed for muscle spasms., Disp: , Rfl:  .  gabapentin (NEURONTIN) 300 MG capsule, Take 300 mg by mouth 2 (two) times daily. , Disp: , Rfl:  .  loratadine (CLARITIN) 10 MG tablet, Take 10 mg by mouth daily as needed. , Disp: , Rfl:  .  OMEGA-3 FATTY ACIDS PO, , Disp: , Rfl:  .  valACYclovir (VALTREX) 500 MG tablet, Take 1 tablet (500 mg total) by mouth 2 (two) times daily., Disp: 14 tablet, Rfl: 3 .  Vitamin D, Ergocalciferol, (DRISDOL) 50000 units CAPS capsule, TAKE ONE CAPSULE BY MOUTH VERY 7 DAYS, Disp: 12  capsule, Rfl: 1 .  hydrocortisone (ANUSOL-HC) 25 MG suppository, Place 1 suppository (25 mg total) rectally 2 (two) times daily. (Patient not taking: Reported on 04/08/2017), Disp: 12 suppository, Rfl: 0 .  ibuprofen (ADVIL,MOTRIN) 600 MG tablet, Take 1 tablet (600 mg total) by mouth every 6 (six) hours as needed. (Patient not taking: Reported on 04/08/2017), Disp: 30 tablet, Rfl: 0 .  LORazepam (ATIVAN) 1 MG tablet, Take 1 tablet (1 mg total) by mouth 2 (two) times daily as needed (muscle relaxation). (Patient not taking: Reported on 04/08/2017), Disp: 10 tablet, Rfl: 0 .  predniSONE (STERAPRED UNI-PAK 21 TAB) 10 MG (21) TBPK tablet, Take as directed on package instructions (Patient not taking: Reported on 04/08/2017), Disp: 21 tablet, Rfl: 0   Patient Care Team: Mar Daring, PA-C as PCP - General (Family Medicine)      Objective:   Vitals: BP 130/88 (BP Location: Left Arm, Patient Position: Sitting, Cuff Size: Normal)   Pulse 82   Temp 98.5 F (36.9 C) (Oral)   Ht 5\' 5"  (1.651 m)   Wt 204 lb 6.4 oz (92.7 kg)   LMP 03/21/2017   BMI 34.01 kg/m     Physical Exam  Constitutional: She is oriented to person, place, and time. She appears well-developed and well-nourished. No distress.  HENT:  Head: Normocephalic and atraumatic.  Right Ear: Hearing, tympanic membrane, external ear and ear canal normal.  Left Ear: Hearing, tympanic membrane, external ear and ear canal normal.  Nose: Nose normal.  Mouth/Throat: Uvula is midline, oropharynx is clear and moist and mucous membranes are normal. No oropharyngeal exudate.  Eyes: Pupils are equal, round, and reactive to light. Conjunctivae and EOM are normal. Right eye exhibits no discharge. Left eye exhibits no discharge. No scleral icterus.  Neck: Normal range of motion. Neck supple. No JVD present. Carotid bruit is not present. No tracheal deviation present. No thyromegaly present.  Cardiovascular: Normal rate, regular rhythm, normal heart  sounds and intact distal pulses.  Exam reveals no gallop and no friction rub.   No murmur heard. Pulmonary/Chest: Effort normal and breath sounds normal. No respiratory distress. She has no wheezes. She has no rales. She exhibits no tenderness. Right breast exhibits no inverted nipple, no mass, no nipple discharge, no skin change and no tenderness. Left breast exhibits no inverted nipple, no mass, no nipple discharge, no skin change and no tenderness. Breasts are symmetrical.  Abdominal: Soft. Bowel sounds are normal. She exhibits no distension and no mass. There is no tenderness. There is no rebound and no guarding.  Musculoskeletal: Normal range of motion. She exhibits no edema or tenderness.  Lymphadenopathy:    She has no cervical adenopathy.  Neurological: She is alert and  oriented to person, place, and time. She has normal reflexes.  Skin: Skin is warm and dry. No rash noted. She is not diaphoretic.  Psychiatric: She has a normal mood and affect. Her behavior is normal. Judgment and thought content normal.  Vitals reviewed.    Depression Screen PHQ 2/9 Scores 04/08/2017 11/04/2015  PHQ - 2 Score 1 -  PHQ- 9 Score 5 -  Exception Documentation - Patient refusal      Assessment & Plan:     Routine Health Maintenance and Physical Exam  Exercise Activities and Dietary recommendations Goals    None      Immunization History  Administered Date(s) Administered  . Tdap 06/08/2010    Health Maintenance  Topic Date Due  . HIV Screening  03/25/1985  . INFLUENZA VACCINE  04/20/2017  . PAP SMEAR  11/03/2018  . TETANUS/TDAP  06/08/2020     Discussed health benefits of physical activity, and encouraged her to engage in regular exercise appropriate for her age and condition.    1. Annual physical exam Normal physical exam today. Will check labs as below and f/u pending lab results. If labs are stable and WNL she will not need to have these rechecked for one year at her next  annual physical exam. She is to call the office in the meantime if she has any acute issue, questions or concerns.  2. Breast cancer screening Breast exam today was normal. There is no family history of breast cancer. She does perform regular self breast exams. Mammogram was ordered as below. Information for [facility] clinic was given to patient so she may schedule her mammogram at her convenience. - MM DIGITAL SCREENING BILATERAL; Future  3. Other iron deficiency anemia Will check labs as below and f/u pending results.  4. Avitaminosis D Stable. Diagnosis pulled for medication refill. Continue current medical treatment plan. Will check labs as below and f/u pending results. - Vitamin D (25 hydroxy) - Vitamin D, Ergocalciferol, (DRISDOL) 50000 units CAPS capsule; TAKE ONE CAPSULE BY MOUTH VERY 7 DAYS  Dispense: 12 capsule; Refill: 1  5. B12 deficiency Will check labs as below and f/u pending results.  6. Moderate episode of recurrent major depressive disorder (HCC) Stable. Diagnosis pulled for medication refill. Continue current medical treatment plan. - citalopram (CELEXA) 40 MG tablet; Take 1 tablet (40 mg total) by mouth daily.  Dispense: 90 tablet; Refill: 1  7. BMI 34.0-34.9,adult Counseled patient on healthy lifestyle modifications including dieting and exercise.  - Ambulatory referral to Sleep Studies  8. 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  9. Anxiety Stable. Diagnosis pulled for medication refill. Continue current medical treatment plan. - ALPRAZolam (XANAX) 0.5 MG tablet; Take 1 tablet (0.5 mg total) by mouth 2 (two) times daily as needed.  Dispense: 60 tablet; Refill: 1  10. Elevated hemoglobin A1c Will check labs as below and f/u pending results. - Hemoglobin A1c  11. Encounter for lipid screening for cardiovascular disease Will check labs as below and f/u pending results. - Lipid panel  12. Snoring Patient has been having increased  daytime fatigue and known snoring. She is also obese. She is interested in a sleep study. She does have morning headaches but also has h/o arnold-chiari malformation which also causes baseline headaches so she is unsure which may be cause. She reports her headaches have not changed.  - Ambulatory referral to Sleep Studies  13. Fatigue, unspecified type See above medical treatment plan. - Ambulatory referral to Sleep Studies  --------------------------------------------------------------------  Mar Daring, PA-C  [facility]

## 2017-04-08 NOTE — Patient Instructions (Signed)

## 2017-04-11 DIAGNOSIS — Z136 Encounter for screening for cardiovascular disorders: Secondary | ICD-10-CM | POA: Diagnosis not present

## 2017-04-11 DIAGNOSIS — R7309 Other abnormal glucose: Secondary | ICD-10-CM | POA: Diagnosis not present

## 2017-04-11 DIAGNOSIS — Z13220 Encounter for screening for lipoid disorders: Secondary | ICD-10-CM | POA: Diagnosis not present

## 2017-04-11 DIAGNOSIS — E559 Vitamin D deficiency, unspecified: Secondary | ICD-10-CM | POA: Diagnosis not present

## 2017-04-12 ENCOUNTER — Telehealth: Payer: Self-pay

## 2017-04-12 LAB — LIPID PANEL
CHOLESTEROL TOTAL: 141 mg/dL (ref 100–199)
Chol/HDL Ratio: 2.8 ratio (ref 0.0–4.4)
HDL: 50 mg/dL (ref >39)
LDL CALC: 77 mg/dL (ref 0–99)
Triglycerides: 72 mg/dL (ref 0–149)
VLDL CHOLESTEROL CAL: 14 mg/dL (ref 5–40)

## 2017-04-12 LAB — HEMOGLOBIN A1C
ESTIMATED AVERAGE GLUCOSE: 111 mg/dL
Hgb A1c MFr Bld: 5.5 % (ref 4.8–5.6)

## 2017-04-12 LAB — VITAMIN D 25 HYDROXY (VIT D DEFICIENCY, FRACTURES): Vit D, 25-Hydroxy: 21.8 ng/mL — ABNORMAL LOW (ref 30.0–100.0)

## 2017-04-12 NOTE — Telephone Encounter (Signed)
LMTCB  Thanks,  -Arnav Cregg 

## 2017-04-12 NOTE — Telephone Encounter (Signed)
-----   Message from Mar Daring, PA-C sent at 04/12/2017  8:31 AM EDT ----- Vit D remains low. Recommend continuing Vit D 50,000 IU weekly. All other labs are WNL.

## 2017-04-13 NOTE — Telephone Encounter (Signed)
Patient advised as directed below.  Thanks,  -Joseline 

## 2017-04-13 NOTE — Telephone Encounter (Signed)
-----   Message from Mar Daring, PA-C sent at 04/12/2017  8:31 AM EDT ----- Vit D remains low. Recommend continuing Vit D 50,000 IU weekly. All other labs are WNL.

## 2017-05-13 ENCOUNTER — Telehealth: Payer: Self-pay

## 2017-05-13 NOTE — Telephone Encounter (Signed)
LMTCB

## 2017-05-13 NOTE — Telephone Encounter (Signed)
Patient called and wanted to speak to [facility] nurse only but I advised her that I can take a message and send it to [place]. She said that she wanted to ask if you would prescribe Lidocaine for her for the problem you treated her for. I tried to ask what issue that was but patient would not say just said Tawanna Sat would know. Patient said if Jenni's nurse need to call her back she will speak to her.-aa

## 2017-05-13 NOTE — Telephone Encounter (Signed)
Virginia Woods   Can you call her and see if she has taken the Valtrex yet?  Thanks. JB

## 2017-05-18 NOTE — Telephone Encounter (Signed)
NA  Thanks,  -Terisa Belardo

## 2017-06-02 DIAGNOSIS — R234 Changes in skin texture: Secondary | ICD-10-CM | POA: Diagnosis not present

## 2017-06-02 DIAGNOSIS — D485 Neoplasm of uncertain behavior of skin: Secondary | ICD-10-CM | POA: Diagnosis not present

## 2017-06-02 DIAGNOSIS — D225 Melanocytic nevi of trunk: Secondary | ICD-10-CM | POA: Diagnosis not present

## 2017-06-02 DIAGNOSIS — R208 Other disturbances of skin sensation: Secondary | ICD-10-CM | POA: Diagnosis not present

## 2017-06-27 DIAGNOSIS — F438 Other reactions to severe stress: Secondary | ICD-10-CM | POA: Diagnosis not present

## 2017-06-28 DIAGNOSIS — F438 Other reactions to severe stress: Secondary | ICD-10-CM | POA: Diagnosis not present

## 2017-07-12 DIAGNOSIS — F438 Other reactions to severe stress: Secondary | ICD-10-CM | POA: Diagnosis not present

## 2017-07-13 DIAGNOSIS — F438 Other reactions to severe stress: Secondary | ICD-10-CM | POA: Diagnosis not present

## 2017-07-18 DIAGNOSIS — L239 Allergic contact dermatitis, unspecified cause: Secondary | ICD-10-CM | POA: Diagnosis not present

## 2017-07-27 DIAGNOSIS — H04123 Dry eye syndrome of bilateral lacrimal glands: Secondary | ICD-10-CM | POA: Diagnosis not present

## 2017-08-31 ENCOUNTER — Ambulatory Visit: Payer: Federal, State, Local not specified - PPO | Admitting: Physician Assistant

## 2017-08-31 ENCOUNTER — Encounter: Payer: Self-pay | Admitting: Physician Assistant

## 2017-08-31 VITALS — BP 102/62 | HR 84 | Temp 98.5°F | Resp 16 | Wt 205.0 lb

## 2017-08-31 DIAGNOSIS — J019 Acute sinusitis, unspecified: Secondary | ICD-10-CM

## 2017-08-31 DIAGNOSIS — J40 Bronchitis, not specified as acute or chronic: Secondary | ICD-10-CM

## 2017-08-31 MED ORDER — ALBUTEROL SULFATE HFA 108 (90 BASE) MCG/ACT IN AERS: 2.0000 | INHALATION_SPRAY | Freq: Four times a day (QID) | RESPIRATORY_TRACT | 2 refills | Status: DC | PRN

## 2017-08-31 MED ORDER — AMOXICILLIN-POT CLAVULANATE 875-125 MG PO TABS: 1.0000 | ORAL_TABLET | Freq: Two times a day (BID) | ORAL | 0 refills | Status: AC

## 2017-08-31 NOTE — Patient Instructions (Signed)
Mucinex DM for cough and congestion   Acute Bronchitis, Adult Acute bronchitis is when air tubes (bronchi) in the lungs suddenly get swollen. The condition can make it hard to breathe. It can also cause these symptoms:  A cough.  Coughing up clear, yellow, or green mucus.  Wheezing.  Chest congestion.  Shortness of breath.  A fever.  Body aches.  Chills.  A sore throat.  Follow these instructions at home: Medicines  Take over-the-counter and prescription medicines only as told by your doctor.  If you were prescribed an antibiotic medicine, take it as told by your doctor. Do not stop taking the antibiotic even if you start to feel better. General instructions  Rest.  Drink enough fluids to keep your pee (urine) clear or pale yellow.  Avoid smoking and secondhand smoke. If you smoke and you need help quitting, ask your doctor. Quitting will help your lungs heal faster.  Use an inhaler, cool mist vaporizer, or humidifier as told by your doctor.  Keep all follow-up visits as told by your doctor. This is important. How is this prevented? To lower your risk of getting this condition again:  Wash your hands often with soap and water. If you cannot use soap and water, use hand sanitizer.  Avoid contact with people who have cold symptoms.  Try not to touch your hands to your mouth, nose, or eyes.  Make sure to get the flu shot every year.  Contact a doctor if:  Your symptoms do not get better in 2 weeks. Get help right away if:  You cough up blood.  You have chest pain.  You have very bad shortness of breath.  You become dehydrated.  You faint (pass out) or keep feeling like you are going to pass out.  You keep throwing up (vomiting).  You have a very bad headache.  Your fever or chills gets worse. This information is not intended to replace advice given to you by your health care provider. Make sure you discuss any questions you have with your health  care provider. Document Released: 02/23/2008 Document Revised: 04/14/2016 Document Reviewed: 02/25/2016 Elsevier Interactive Patient Education  2017 [employer].

## 2017-08-31 NOTE — Progress Notes (Signed)
[place]  Chief Complaint  Patient presents with  . URI    Started about a week ago    Subjective:    Patient ID: Virginia Woods, female    DOB: September 09, 1971, 46 y.o.   MRN: [id number]  Upper Respiratory Infection: Virginia Woods is a 46 y.o. female with a past complaining of symptoms of a URI, possible sinusitis. Symptoms include left ear pain, congestion, cough and sore throat. Onset of symptoms was 1 week ago, gradually worsening since that time. She also c/o congestion, cough described as productive, nausea without vomiting and post nasal drip for the past 1 week .  She is drinking plenty of fluids. Evaluation to date: none. Treatment to date: cough suppressants and decongestants. The treatment has provided minimal relief. She has used her husbands inhaler, cannot remember which one. She has used her daughter's codeine cough syrup. She has also been using afarin.    Review of Systems  Constitutional: Positive for fatigue. Negative for activity change, appetite change, chills, diaphoresis, fever and unexpected weight change.  HENT: Positive for congestion, ear pain (Left ear pain), nosebleeds, postnasal drip, rhinorrhea, sinus pressure, sinus pain and sore throat. Negative for ear discharge, sneezing and tinnitus.   Eyes: Negative.   Respiratory: Positive for cough and chest tightness. Negative for apnea, choking, shortness of breath, wheezing and stridor.   Gastrointestinal: Positive for nausea. Negative for abdominal distention, abdominal pain, anal bleeding, blood in stool, constipation, diarrhea, rectal pain and vomiting.  Neurological: Positive for headaches. Negative for dizziness and light-headedness.       Objective:   BP 102/62 (BP Location: Left Arm, Patient Position: Sitting, Cuff Size: Large)   Pulse 84   Temp 98.5 F (36.9 C) (Oral)   Resp 16   Wt 205 lb (93 kg)   BMI 34.11 kg/m   Patient Active Problem List    Diagnosis Date Noted  . 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 Allergic rhinitis 05/20/2015  . 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 Chronic headache 05/20/2015  . Chronic pain 05/20/2015  . Clinical depression 05/20/2015  . Dermatitis, eczematoid 05/20/2015  . Acute Lyme disease 05/20/2015  . Muscle ache 05/20/2015  . Apnea, sleep 05/20/2015  . B12 deficiency 05/20/2015  . Avitaminosis D 05/20/2015  . Hypo-osmolality and hyponatremia 10/13/2009  . Anemia, iron deficiency 05/16/2009  . Cannot sleep 04/18/2008  . Anxiety 11/05/2004    Outpatient Encounter Medications as of 08/31/2017  Medication Sig Note  . ALPRAZolam (XANAX) 0.5 MG tablet Take 1 tablet (0.5 mg total) by mouth 2 (two) times daily as needed.   . citalopram (CELEXA) 40 MG tablet Take 1 tablet (40 mg total) by mouth daily.   . cyclobenzaprine (FLEXERIL) 10 MG tablet Take 10 mg by mouth 3 (three) times daily as needed for muscle spasms.   Marland Kitchen gabapentin (NEURONTIN) 300 MG capsule Take 300 mg by mouth 2 (two) times daily.  08/29/2015: Received from: [facility]  . hydrocortisone (ANUSOL-HC) 25 MG suppository Place 1 suppository (25 mg total) rectally 2 (two) times daily.   Marland Kitchen ibuprofen (ADVIL,MOTRIN) 600 MG tablet Take 1 tablet (600 mg total) by mouth every 6 (six) hours as needed.   . loratadine (CLARITIN) 10 MG tablet Take 10 mg by mouth daily as needed.  08/29/2015: Received from: [facility]  . OMEGA-3 FATTY ACIDS PO  08/29/2015: Received from: [facility]  . valACYclovir (VALTREX) 500 MG tablet Take 1 tablet (500 mg  total) by mouth 2 (two) times daily.   . Vitamin D, Ergocalciferol, (DRISDOL) 50000 units CAPS capsule TAKE ONE CAPSULE BY MOUTH VERY 7 DAYS    No facility-administered encounter medications on file as of 08/31/2017.     Allergies  Allergen Reactions  . Doxycycline Itching and Rash       Physical Exam  Constitutional: She is oriented  to person, place, and time. She appears well-developed and well-nourished.  HENT:  Right Ear: Tympanic membrane and external ear normal.  Left Ear: Tympanic membrane and external ear normal.  Nose: No epistaxis. Right sinus exhibits maxillary sinus tenderness. Left sinus exhibits maxillary sinus tenderness.  Mouth/Throat: Oropharynx is clear and moist. No oropharyngeal exudate.  Nasal turbinates edematous.  Cardiovascular: Normal rate and regular rhythm.  Pulmonary/Chest: Effort normal. No respiratory distress. She has wheezes.  Abdominal: Soft. Bowel sounds are normal.  Neurological: She is alert and oriented to person, place, and time.  Skin: Skin is warm and dry.  Psychiatric: She has a normal mood and affect. Her behavior is normal.       Assessment & Plan:  1. Bronchitis  Patient declines prednisone. Will send inhaler. Can use Mucinex DM for cough and congestion. 3 day limit on Afarin. Expect cough to linger for several weeks.  - albuterol (PROVENTIL HFA;VENTOLIN HFA) 108 (90 Base) MCG/ACT inhaler; Inhale 2 puffs into the lungs every 6 (six) hours as needed for wheezing or shortness of breath.  Dispense: 1 Inhaler; Refill: 2  2. Acute non-recurrent sinusitis, unspecified location   - amoxicillin-clavulanate (AUGMENTIN) 875-125 MG tablet; Take 1 tablet by mouth 2 (two) times daily for 10 days.  Dispense: 20 tablet; Refill: 0  Return if symptoms worsen or fail to improve.   The entirety of the information documented in the History of Present Illness, Review of Systems and Physical Exam were personally obtained by me. Portions of this information were initially documented by Virginia Woods, CMA and reviewed by me for thoroughness and accuracy.

## 2017-10-02 ENCOUNTER — Other Ambulatory Visit: Payer: Self-pay | Admitting: Physician Assistant

## 2017-10-02 DIAGNOSIS — F331 Major depressive disorder, recurrent, moderate: Secondary | ICD-10-CM

## 2017-10-02 DIAGNOSIS — E559 Vitamin D deficiency, unspecified: Secondary | ICD-10-CM

## 2017-10-03 NOTE — Telephone Encounter (Signed)
Does she still needs the vitamin D 50000?

## 2017-10-04 NOTE — Telephone Encounter (Signed)
Patient needs to have Vit D rechecked. If normal she can go to OTC supplementation of 1000-2000 IU daily.

## 2017-11-11 ENCOUNTER — Encounter: Payer: Self-pay | Admitting: Physician Assistant

## 2017-11-11 ENCOUNTER — Ambulatory Visit: Payer: Federal, State, Local not specified - PPO | Admitting: Physician Assistant

## 2017-11-11 VITALS — BP 130/80 | HR 80 | Temp 98.5°F | Resp 16 | Ht 65.0 in | Wt 208.0 lb

## 2017-11-11 DIAGNOSIS — E6609 Other obesity due to excess calories: Secondary | ICD-10-CM | POA: Diagnosis not present

## 2017-11-11 DIAGNOSIS — G44329 Chronic post-traumatic headache, not intractable: Secondary | ICD-10-CM

## 2017-11-11 DIAGNOSIS — G8928 Other chronic postprocedural pain: Secondary | ICD-10-CM | POA: Diagnosis not present

## 2017-11-11 DIAGNOSIS — Q0700 Arnold-Chiari syndrome without spina bifida or hydrocephalus: Secondary | ICD-10-CM | POA: Diagnosis not present

## 2017-11-11 DIAGNOSIS — Z6834 Body mass index (BMI) 34.0-34.9, adult: Secondary | ICD-10-CM

## 2017-11-11 NOTE — Patient Instructions (Signed)

## 2017-11-11 NOTE — Progress Notes (Signed)
Patient: Virginia Woods Female    DOB: 01/04/1971   47 y.o.   MRN: [id number] Visit Date: 11/11/2017  Today's Provider: Mar Daring, PA-C   Chief Complaint  Patient presents with  . Pain   Subjective:    HPI Patient here today for clearance to go back to work. 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 Patient reports she is trying to go back to work full time.  Patient reports she can not do any heavy lifting. Patient reports she is unable to sit or stand for long periods of time. Patient reports frequent and recurrent neck pain, head ache, pain radiates to arms and reports numbness. 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     Allergies  Allergen Reactions  . Doxycycline Itching and Rash     Current Outpatient Medications:  .  albuterol (PROVENTIL HFA;VENTOLIN HFA) 108 (90 Base) MCG/ACT inhaler, Inhale 2 puffs into the lungs every 6 (six) hours as needed for wheezing or shortness of breath., Disp: 1 Inhaler, Rfl: 2 .  ALPRAZolam (XANAX) 0.5 MG tablet, Take 1 tablet (0.5 mg total) by mouth 2 (two) times daily as needed., Disp: 60 tablet, Rfl: 1 .  Ascorbic Acid (VITAMIN C PO), Take by mouth., Disp: , Rfl:  .  citalopram (CELEXA) 40 MG tablet, TAKE 1 TABLET BY MOUTH EVERY DAY, Disp: 90 tablet, Rfl: 1 .  Cyanocobalamin (VITAMIN B 12 PO), Take by mouth., Disp: , Rfl:  .  cyclobenzaprine (FLEXERIL) 10 MG tablet, Take 10 mg by mouth 3 (three) times daily as needed for muscle spasms., Disp: , Rfl:  .  gabapentin (NEURONTIN) 300 MG capsule, Take 300 mg by mouth 2 (two) times daily. , Disp: , Rfl:  .  hydrocortisone (ANUSOL-HC) 25 MG suppository, Place 1 suppository (25 mg total) rectally 2 (two) times daily., Disp: 12 suppository, Rfl: 0 .  ibuprofen (ADVIL,MOTRIN) 600 MG tablet, Take  1 tablet (600 mg total) by mouth every 6 (six) hours as needed., Disp: 30 tablet, Rfl: 0 .  loratadine (CLARITIN) 10 MG tablet, Take 10 mg by mouth daily as needed. , Disp: , Rfl:  .  Multiple Vitamins-Minerals (ZINC PO), Take by mouth., Disp: , Rfl:  .  OMEGA-3 FATTY ACIDS PO, , Disp: , Rfl:  .  valACYclovir (VALTREX) 500 MG tablet, Take 1 tablet (500 mg total) by mouth 2 (two) times daily., Disp: 14 tablet, Rfl: 3 .  Vitamin D, Ergocalciferol, (DRISDOL) 50000 units CAPS capsule, TAKE ONE CAPSULE BY MOUTH VERY 7 DAYS, Disp: 12 capsule, Rfl: 1  Review of Systems  Constitutional: Positive for activity change and fatigue.  Respiratory: Negative.   Cardiovascular: Negative.   Musculoskeletal: Positive for arthralgias, back pain, neck pain and neck stiffness.  Neurological: Positive for dizziness, numbness and headaches.  Psychiatric/Behavioral: Positive for agitation, decreased concentration and sleep disturbance.    Social History   Tobacco Use  . Smoking status: Never Smoker  . Smokeless tobacco: Never Used  Substance Use Topics  . Alcohol use: No   Objective:   BP 130/80 (BP Location: Left Arm, Patient Position: Sitting, Cuff Size: Large)   Pulse 80   Temp 98.5 F (36.9 C) (Oral)   Resp 16   Ht 5\' 5"  (1.651 m)   Wt 208 lb (94.3 kg)  LMP 11/11/2017   BMI 34.61 kg/m  Vitals:   11/11/17 0928  BP: 130/80  Pulse: 80  Resp: 16  Temp: 98.5 F (36.9 C)  TempSrc: Oral  Weight: 208 lb (94.3 kg)  Height: 5\' 5"  (1.651 m)     Physical Exam  Constitutional: She is oriented to person, place, and time. She appears well-developed and well-nourished. No distress.  Neck: Normal range of motion. Neck supple. No JVD present. No tracheal deviation present. No thyromegaly present.  Cardiovascular: Normal rate, regular rhythm and normal heart sounds. Exam reveals no gallop and no friction rub.  No murmur heard. Pulmonary/Chest: Effort normal and breath sounds normal. No respiratory  distress. She has no wheezes. She has no rales.  Musculoskeletal: Normal range of motion. She exhibits no edema.  Lymphadenopathy:    She has no cervical adenopathy.  Neurological: She is alert and oriented to person, place, and time. No cranial nerve deficit. Coordination normal.  Skin: She is not diaphoretic.  Psychiatric: Her speech is normal and behavior is normal. Judgment and thought content normal. Cognition and memory are normal. She exhibits a depressed mood.  Vitals reviewed.      Assessment & Plan:     1. Class 1 obesity due to excess calories without serious comorbidity with body mass index (BMI) of 34.0 to 34.9 in adult Patient concerned about being obese. Wants to try to lose weight naturally. Discussed calorie counting with using a food diary such as myfitnesspal or loseit. Referral placed to nutrition therapy. Start exercise slowly and increase as tolerated.  - Amb ref to Medical Nutrition Therapy-MNT  2. Arnold-Chiari malformation [facility]) Patient has been stable for a while now neurologically speaking. She does have physical limitations and may need day off accommodations for her headaches but should be able to go back to work with low impact, no lifting, sedentary job. Letter will be completed for patient to try to get assistance with vocational rehabilitation to try to find future employment.   3. Chronic post-traumatic headache, not intractable See above medical treatment plan.  4. Other chronic postprocedural pain Starting to have some right arm numbness/tingling/burning from neck to elbow. Discussed possible scar tissue vs cervical radiculopathy. Wants to try massage, tumeric, heating pad first and will call if no improvements.        Mar Daring, PA-C  [facility]

## 2017-11-16 ENCOUNTER — Encounter: Payer: Self-pay | Admitting: Physician Assistant

## 2017-11-23 ENCOUNTER — Encounter: Payer: Self-pay | Admitting: Dietician

## 2017-11-23 ENCOUNTER — Encounter: Payer: Medicare Other | Attending: Physician Assistant | Admitting: Dietician

## 2017-11-23 VITALS — Ht 65.0 in | Wt 209.9 lb

## 2017-11-23 DIAGNOSIS — E669 Obesity, unspecified: Secondary | ICD-10-CM | POA: Diagnosis not present

## 2017-11-23 DIAGNOSIS — Z6834 Body mass index (BMI) 34.0-34.9, adult: Secondary | ICD-10-CM

## 2017-11-23 DIAGNOSIS — Z713 Dietary counseling and surveillance: Secondary | ICD-10-CM | POA: Insufficient documentation

## 2017-11-23 NOTE — Patient Instructions (Signed)
   Plan some balanced meals with lean protein, small portion of starch, and a vegetable and/or fruit.   Increase water and drink less sugary drinks.   Add vegetables and fruits with meals and snacks.  Eat a meal or snack every 3-5 hours during the day.   Continue to gradually increase physical activity/ exercise. Can try 10-15 minutes more than once a day if needed.

## 2017-11-23 NOTE — Progress Notes (Signed)
Medical Nutrition Therapy: Visit start time: 1100  end time: 1200  Assessment:  Diagnosis: obesity Past medical history: Fibromyalgia, chronic headaches, Chiari malformation, diverticulosis Psychosocial issues/ stress concerns: history of depression, taking medication and seeing therapist Preferred learning method:  . Auditory . Visual . Hands-on  Current weight: 209.9lbs  Height: 5'5" Medications, supplements: reconciled list in medical record  Progress and evaluation: Patient reports currently at highest adult weight; has been relatively stable for past 9 months. Her personal goal is 165-170lbs. She has been unable to work for some time due to issues from surgery for chiari malformation; she has chronic neck and shoulder pain along with intermittent fibromyalgia pain. She has been unable to engage in any regular exercise for several years. She is unable to cook other than for short periods of time, and reports her meal pattern consists mostly of quick meals and snacks throughout the day, no structured eating pattern.   Physical activity: none until this week tried elliptical for 10 minutes  Dietary Intake:  Usual eating pattern includes 2-3 meals and 2-3 snacks per day. Dining out frequency: about 6 meals per week.  Breakfast: 3/6--egg white delight (did not eat all) 1/2 pkg mayo, occasional coffee with 4pkg sugar and creamer; low carb platter bacon and eggs 1x a week; sometimes oatmeal with brown sugar and raisins Snack: cashews or pistachios Lunch: none yesterday; 3/5-- fried chicken, green beans, small portion mac and cheese.  Snack: mini caramel rice cakes with peanut butter Supper: 3/5-- tuna salad and crackers, orange and grapes Snack: sometimes cookies, cake; 1/2 bagel with cream cheese Beverages: water, simply raspberry lemonade about 24oz daily, occasional sprite NOTE: Limits fried foods; loves sweets; reports mindless eating  Nutrition Care Education: Topics covered: weight  control Basic nutrition: basic food groups, appropriate nutrient balance, appropriate meal and snack schedule, general nutrition guidelines    Weight control: benefits of weight control, determining reasonable weight goal, basic meal planning for calorie control and balanced eating; importance of low fat and low sugar choices; appropriate food portions; mindless vs mindful eating and strategies to decrease mindless eating by tracking food intake, etc.; role of physical activity and exercise.   Nutritional Diagnosis:  -3.3 Overweight/obesity As related to excess calories, inactivity due to physical pain.  As evidenced by BMI 35, patient dietary recall .  Intervention: Instruction as noted above.   Set goals with patient direction to decrease sugar intake, mindless eating, and gradually increase activity   Patient is hopeful that weight loss will help with pain management and overall sense of wellbeing.  Education Materials given:  . Food lists/ Planning A Balanced Meal . Sample meal pattern/ menus . Goals/ instructions  Learner/ who was taught:  . Patient   Level of understanding: Marland Kitchen Verbalizes/ demonstrates competency  Demonstrated degree of understanding via:   Teach back Learning barriers: . None  Willingness to learn/ readiness for change: . Acceptance, ready for change  Monitoring and Evaluation:  Dietary intake, exercise, and body weight      follow up: 12/14/17

## 2017-11-29 ENCOUNTER — Telehealth: Payer: Self-pay

## 2017-11-29 NOTE — Telephone Encounter (Signed)
Noted  

## 2017-11-29 NOTE — Telephone Encounter (Signed)
Reports that she started with pain in her right shoulder on Saturday or before than. Radiates all the way to her finger with numbness and fingers. Reports that when she takes a deep breath it hurts under her right breast. Shoulder blades.Is worse when she up. She reports that she called the Neurologist and made an appointment for next Friday.She reports that she call them back to be seen sooner.Patient Denies SOB,chest pain,jaw pain,lightheadedness, dizziness,blurred vision. Scheduled patient for tomorrow but did advised that if she develops any of the symptoms mention or feel worse she needs to go to the ED. Patient agree.  Thanks,  -Joseline

## 2017-11-30 ENCOUNTER — Ambulatory Visit: Payer: Federal, State, Local not specified - PPO | Admitting: Physician Assistant

## 2017-11-30 ENCOUNTER — Encounter: Payer: Self-pay | Admitting: Physician Assistant

## 2017-11-30 VITALS — BP 118/76 | Temp 98.6°F | Resp 16 | Wt 208.0 lb

## 2017-11-30 DIAGNOSIS — M62838 Other muscle spasm: Secondary | ICD-10-CM | POA: Diagnosis not present

## 2017-11-30 MED ORDER — METHOCARBAMOL 500 MG PO TABS: 250.0000 mg | ORAL_TABLET | Freq: Four times a day (QID) | ORAL | 0 refills | Status: DC

## 2017-11-30 MED ORDER — METHYLPREDNISOLONE 4 MG PO TBPK
ORAL_TABLET | ORAL | 0 refills | Status: DC
Start: 2017-11-30 — End: 2018-04-28

## 2017-11-30 NOTE — Patient Instructions (Signed)

## 2017-11-30 NOTE — Progress Notes (Signed)
Patient: Virginia Woods Female    DOB: Jul 29, 1971   47 y.o.   MRN: [id number] Visit Date: 11/30/2017  Today's Provider: Mar Daring, PA-C   Chief Complaint  Patient presents with  . Shoulder Pain   Subjective:    HPI Patient reports that she has pain in her right shoulder for the last week. She reports that she has difficulty sleeping due to the pain. She reports that the pain radiates to her right arm and has some numbness and tingling that seems to be constant. She has used Tylenol and Flexeril at home. Patient feels a tight sensation in her right arm as well.     Allergies  Allergen Reactions  . Doxycycline Itching and Rash     Current Outpatient Medications:  .  ALPRAZolam (XANAX) 0.5 MG tablet, Take 1 tablet (0.5 mg total) by mouth 2 (two) times daily as needed., Disp: 60 tablet, Rfl: 1 .  Ascorbic Acid (VITAMIN C PO), Take by mouth., Disp: , Rfl:  .  citalopram (CELEXA) 40 MG tablet, TAKE 1 TABLET BY MOUTH EVERY DAY, Disp: 90 tablet, Rfl: 1 .  Cyanocobalamin (VITAMIN B 12 PO), Take by mouth., Disp: , Rfl:  .  cyclobenzaprine (FLEXERIL) 10 MG tablet, Take 10 mg by mouth 3 (three) times daily as needed for muscle spasms., Disp: , Rfl:  .  diphenhydrAMINE (BENADRYL) 25 MG tablet, Take 25 mg by mouth at bedtime as needed for sleep., Disp: , Rfl:  .  gabapentin (NEURONTIN) 300 MG capsule, Take 300 mg by mouth 2 (two) times daily. , Disp: , Rfl:  .  ibuprofen (ADVIL,MOTRIN) 600 MG tablet, Take 1 tablet (600 mg total) by mouth every 6 (six) hours as needed., Disp: 30 tablet, Rfl: 0 .  loratadine (CLARITIN) 10 MG tablet, Take 10 mg by mouth daily as needed. , Disp: , Rfl:  .  Multiple Vitamins-Minerals (ZINC PO), Take by mouth., Disp: , Rfl:  .  OMEGA-3 FATTY ACIDS PO, , Disp: , Rfl:  .  Vitamin D, Ergocalciferol, (DRISDOL) 50000 units CAPS capsule, TAKE ONE CAPSULE BY MOUTH VERY 7 DAYS, Disp: 12 capsule, Rfl: 1 .  albuterol (PROVENTIL HFA;VENTOLIN HFA) 108 (90  Base) MCG/ACT inhaler, Inhale 2 puffs into the lungs every 6 (six) hours as needed for wheezing or shortness of breath. (Patient not taking: Reported on 11/30/2017), Disp: 1 Inhaler, Rfl: 2 .  valACYclovir (VALTREX) 500 MG tablet, Take 1 tablet (500 mg total) by mouth 2 (two) times daily. (Patient not taking: Reported on 11/23/2017), Disp: 14 tablet, Rfl: 3  Review of Systems  Constitutional: Negative.   Respiratory: Negative.   Cardiovascular: Negative.   Gastrointestinal: Negative.   Musculoskeletal: Positive for arthralgias and myalgias.  Neurological: Negative.     Social History   Tobacco Use  . Smoking status: Never Smoker  . Smokeless tobacco: Never Used  Substance Use Topics  . Alcohol use: Yes    Comment: 1-2 glasses wine per month   Objective:   BP 118/76 (BP Location: Right Arm)   Temp 98.6 F (37 C)   Resp 16   Wt 208 lb (94.3 kg)   LMP 11/11/2017   BMI 34.61 kg/m  Vitals:   11/30/17 1327  BP: 118/76  Resp: 16  Temp: 98.6 F (37 C)  Weight: 208 lb (94.3 kg)     Physical Exam  Constitutional: She appears well-developed and well-nourished. No distress.  Neck: Normal range of motion. Neck supple. No  tracheal deviation present. No thyromegaly present.  Cardiovascular: Normal rate, regular rhythm and normal heart sounds. Exam reveals no gallop and no friction rub.  No murmur heard. Pulmonary/Chest: Effort normal and breath sounds normal. No respiratory distress. She has no wheezes. She has no rales.  Musculoskeletal:       Right shoulder: She exhibits decreased range of motion and tenderness. She exhibits no bony tenderness, normal pulse and normal strength.       Arms: Lymphadenopathy:    She has no cervical adenopathy.  Skin: She is not diaphoretic.  Vitals reviewed.       Assessment & Plan:     1. Muscle spasm Change muscle relaxer to robaxin as below. Medrol dose pak for inflammation. Ice and heat prn. Massage therapy recommended. Call if symptoms  worsen or fail to improve.  - methocarbamol (ROBAXIN) 500 MG tablet; Take 0.5-1 tablets (250-500 mg total) by mouth 4 (four) times daily.  Dispense: 30 tablet; Refill: 0 - methylPREDNISolone (MEDROL) 4 MG TBPK tablet; 6 day taper; take as directed on package instructions  Dispense: 21 tablet; Refill: 0       Mar Daring, PA-C  [facility]

## 2017-12-12 ENCOUNTER — Telehealth: Payer: Self-pay | Admitting: Physician Assistant

## 2017-12-12 NOTE — Telephone Encounter (Signed)
Pt states she needs a letter in writing stating it is okay for her to get a back massage.  States her appt for the massage is 12/14/17 and they will not do it unless she she has the letter.

## 2017-12-12 NOTE — Telephone Encounter (Signed)
Letter printed.

## 2017-12-12 NOTE — Telephone Encounter (Signed)
Patient advised that letter printed and placed up front ready for pick up.  Thanks,  -Virgle Arth

## 2017-12-14 ENCOUNTER — Ambulatory Visit: Payer: Federal, State, Local not specified - PPO | Admitting: Dietician

## 2017-12-19 ENCOUNTER — Telehealth: Payer: Self-pay | Admitting: Physician Assistant

## 2017-12-19 DIAGNOSIS — M25511 Pain in right shoulder: Secondary | ICD-10-CM

## 2017-12-19 NOTE — Telephone Encounter (Signed)
Patient advised as directed below. She also reports that she can prefers to be refer to Ortho.

## 2017-12-19 NOTE — Telephone Encounter (Signed)
Referral placed.

## 2017-12-19 NOTE — Telephone Encounter (Signed)
Insurance requires xray 1st. Will order xray. Some insurance companies require PT as well prior to MRI. Can order PT if she wishes or refer to ortho as well. They can normally get MRI quicker than a PCP.

## 2017-12-19 NOTE — Telephone Encounter (Signed)
Pt states her right shoulder pain.  She is wanting to see if she needs a MRI or something to look inside.  States the medication isn't helping when the medication wears off.  Ice and heat isn't helping either.   Pt wanting to know is she has to come back in for appt prior to MRI.

## 2017-12-19 NOTE — Telephone Encounter (Signed)
Please Review.  Thanks,  -Joseline 

## 2017-12-20 ENCOUNTER — Telehealth: Payer: Self-pay

## 2017-12-20 ENCOUNTER — Encounter: Payer: Self-pay | Admitting: Physician Assistant

## 2017-12-20 NOTE — Telephone Encounter (Signed)
Patient returning your call. CB# [phone]

## 2017-12-26 ENCOUNTER — Telehealth: Payer: Self-pay | Admitting: Physician Assistant

## 2017-12-26 DIAGNOSIS — R635 Abnormal weight gain: Secondary | ICD-10-CM | POA: Diagnosis not present

## 2017-12-26 DIAGNOSIS — M25511 Pain in right shoulder: Secondary | ICD-10-CM | POA: Diagnosis not present

## 2017-12-26 NOTE — Telephone Encounter (Signed)
Yes it does check for lupus. Will add a1c

## 2017-12-26 NOTE — Telephone Encounter (Signed)
Patient wants to make sure that this labs will rule out Lupus. Patient wants to be tested for diabetes. Patient wants to know if you have any other test that you would recommend her to get.

## 2017-12-26 NOTE — Telephone Encounter (Signed)
Labs ordered.

## 2017-12-26 NOTE — Telephone Encounter (Signed)
Patient is still having almost unbearable pain in her shoulder. She has an appt on wed. W/ orthopedic but wants to have labwork done to rule out an "autoimmune disease".  Would like to talk to [place] Woods nurse regarding this.

## 2017-12-27 LAB — HEMOGLOBIN A1C
Est. average glucose Bld gHb Est-mCnc: 111 mg/dL
Hgb A1c MFr Bld: 5.5 % (ref 4.8–5.6)

## 2017-12-28 ENCOUNTER — Telehealth: Payer: Self-pay | Admitting: Physician Assistant

## 2017-12-28 ENCOUNTER — Ambulatory Visit: Payer: Federal, State, Local not specified - PPO | Admitting: Dietician

## 2017-12-28 LAB — CBC WITH DIFFERENTIAL/PLATELET
BASOS: 0 %
Basophils Absolute: 0 10*3/uL (ref 0.0–0.2)
EOS (ABSOLUTE): 0.1 10*3/uL (ref 0.0–0.4)
EOS: 1 %
HEMATOCRIT: 38.4 % (ref 34.0–46.6)
Hemoglobin: 11.9 g/dL (ref 11.1–15.9)
IMMATURE GRANS (ABS): 0 10*3/uL (ref 0.0–0.1)
IMMATURE GRANULOCYTES: 0 %
LYMPHS: 33 %
Lymphocytes Absolute: 1.5 10*3/uL (ref 0.7–3.1)
MCH: 22.8 pg — AB (ref 26.6–33.0)
MCHC: 31 g/dL — ABNORMAL LOW (ref 31.5–35.7)
MCV: 73 fL — ABNORMAL LOW (ref 79–97)
MONOS ABS: 0.4 10*3/uL (ref 0.1–0.9)
Monocytes: 9 %
NEUTROS ABS: 2.6 10*3/uL (ref 1.4–7.0)
NEUTROS PCT: 57 %
Platelets: 298 10*3/uL (ref 150–379)
RBC: 5.23 x10E6/uL (ref 3.77–5.28)
RDW: 15.6 % — ABNORMAL HIGH (ref 12.3–15.4)
WBC: 4.5 10*3/uL (ref 3.4–10.8)

## 2017-12-28 LAB — ANA,IFA RA DIAG PNL W/RFLX TIT/PATN
ANA Titer 1: NEGATIVE
CYCLIC CITRULLIN PEPTIDE AB: 6 U (ref 0–19)
Rhuematoid fact SerPl-aCnc: 10.1 IU/mL (ref 0.0–13.9)

## 2017-12-28 NOTE — Telephone Encounter (Signed)
Advised patient of labs 

## 2017-12-28 NOTE — Telephone Encounter (Signed)
Patient is wanting her lab results from 12/26/17 please.

## 2018-01-02 DIAGNOSIS — M5412 Radiculopathy, cervical region: Secondary | ICD-10-CM | POA: Diagnosis not present

## 2018-01-10 ENCOUNTER — Ambulatory Visit: Payer: Federal, State, Local not specified - PPO | Admitting: Dietician

## 2018-01-12 DIAGNOSIS — L819 Disorder of pigmentation, unspecified: Secondary | ICD-10-CM | POA: Diagnosis not present

## 2018-01-12 DIAGNOSIS — T300 Burn of unspecified body region, unspecified degree: Secondary | ICD-10-CM | POA: Diagnosis not present

## 2018-01-13 ENCOUNTER — Encounter: Payer: Self-pay | Admitting: Dietician

## 2018-01-13 NOTE — Progress Notes (Signed)
Patient cancelled her appointment for 01/10/18, which was the third rescheduled appointment. She is unable to schedule another visit at this time due to other health concerns. Sent discharge letter to referring provider.

## 2018-04-28 ENCOUNTER — Ambulatory Visit: Payer: Federal, State, Local not specified - PPO | Admitting: Physician Assistant

## 2018-04-28 ENCOUNTER — Encounter: Payer: Self-pay | Admitting: Physician Assistant

## 2018-04-28 VITALS — BP 120/84 | HR 85 | Temp 99.1°F | Resp 16 | Wt 203.0 lb

## 2018-04-28 DIAGNOSIS — R0683 Snoring: Secondary | ICD-10-CM

## 2018-04-28 DIAGNOSIS — J40 Bronchitis, not specified as acute or chronic: Secondary | ICD-10-CM | POA: Diagnosis not present

## 2018-04-28 DIAGNOSIS — R0689 Other abnormalities of breathing: Secondary | ICD-10-CM

## 2018-04-28 DIAGNOSIS — R400 Somnolence: Secondary | ICD-10-CM | POA: Diagnosis not present

## 2018-04-28 DIAGNOSIS — Q0700 Arnold-Chiari syndrome without spina bifida or hydrocephalus: Secondary | ICD-10-CM | POA: Diagnosis not present

## 2018-04-28 MED ORDER — BENZONATATE 100 MG PO CAPS: 100.0000 mg | ORAL_CAPSULE | Freq: Two times a day (BID) | ORAL | 0 refills | Status: DC | PRN

## 2018-04-28 MED ORDER — ALBUTEROL SULFATE HFA 108 (90 BASE) MCG/ACT IN AERS: 2.0000 | INHALATION_SPRAY | Freq: Four times a day (QID) | RESPIRATORY_TRACT | 2 refills | Status: DC | PRN

## 2018-04-28 MED ORDER — AZITHROMYCIN 250 MG PO TABS: ORAL_TABLET | ORAL | 0 refills | Status: DC

## 2018-04-28 NOTE — Progress Notes (Signed)
Patient: Virginia Woods Female    DOB: 12/10/1970   47 y.o.   MRN: [id number] Visit Date: 04/28/2018  Today's Provider: Mar Daring, PA-C   Chief Complaint  Patient presents with  . Cough   Subjective:    HPI Patient here today C/O cough, chest congestion, and low grade fever x's 3 days. Patient reports taking Mucinex last night, reports mild relief. Patient reports temperature has been 99 to 100. 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 Patient reports rash on left upper chest started this morning. Reports she started using Dove body soap just this week. No other changes.     Allergies  Allergen Reactions  . Doxycycline Itching and Rash     Current Outpatient Medications:  .  ALPRAZolam (XANAX) 0.5 MG tablet, Take 1 tablet (0.5 mg total) by mouth 2 (two) times daily as needed., Disp: 60 tablet, Rfl: 1 .  Ascorbic Acid (VITAMIN C PO), Take by mouth., Disp: , Rfl:  .  citalopram (CELEXA) 40 MG tablet, TAKE 1 TABLET BY MOUTH EVERY DAY, Disp: 90 tablet, Rfl: 1 .  Cyanocobalamin (VITAMIN B 12 PO), Take by mouth., Disp: , Rfl:  .  cyclobenzaprine (FLEXERIL) 10 MG tablet, Take 10 mg by mouth 3 (three) times daily as needed for muscle spasms., Disp: , Rfl:  .  diphenhydrAMINE (BENADRYL) 25 MG tablet, Take 25 mg by mouth at bedtime as needed for sleep., Disp: , Rfl:  .  gabapentin (NEURONTIN) 300 MG capsule, Take 300 mg by mouth 2 (two) times daily. , Disp: , Rfl:  .  ibuprofen (ADVIL,MOTRIN) 600 MG tablet, Take 1 tablet (600 mg total) by mouth every 6 (six) hours as needed., Disp: 30 tablet, Rfl: 0 .  Multiple Vitamins-Minerals (ZINC PO), Take by mouth., Disp: , Rfl:  .  OMEGA-3 FATTY ACIDS PO, , Disp: , Rfl:  .  Vitamin D, Ergocalciferol, (DRISDOL) 50000 units CAPS capsule, TAKE ONE CAPSULE BY MOUTH VERY 7 DAYS, Disp: 12 capsule, Rfl: 1 .  albuterol (PROVENTIL HFA;VENTOLIN HFA) 108 (90  Base) MCG/ACT inhaler, Inhale 2 puffs into the lungs every 6 (six) hours as needed for wheezing or shortness of breath. (Patient not taking: Reported on 11/30/2017), Disp: 1 Inhaler, Rfl: 2 .  loratadine (CLARITIN) 10 MG tablet, Take 10 mg by mouth daily as needed. , Disp: , Rfl:  .  valACYclovir (VALTREX) 500 MG tablet, Take 1 tablet (500 mg total) by mouth 2 (two) times daily. (Patient not taking: Reported on 11/23/2017), Disp: 14 tablet, Rfl: 3  Review of Systems  Constitutional: Positive for fatigue and fever.  HENT: Positive for congestion.   Respiratory: Positive for cough and chest tightness.   Cardiovascular: Negative.   Gastrointestinal: Negative.   Neurological: Positive for light-headedness.    Social History   Tobacco Use  . Smoking status: Never Smoker  . Smokeless tobacco: Never Used  Substance Use Topics  . Alcohol use: Yes    Comment: 1-2 glasses wine per month   Objective:   BP 120/84 (BP Location: Left Arm, Patient Position: Sitting, Cuff Size: Normal)   Pulse 85   Temp 99.1 F (37.3 C) (Oral)   Resp 16   Wt 203 lb (92.1 kg)   LMP 04/27/2018 (Approximate)   SpO2 99%   BMI 33.78 kg/m  Vitals:   04/28/18 1054  BP: 120/84  Pulse: 85  Resp: 16  Temp: 99.1 F (37.3 C)  TempSrc: Oral  SpO2: 99%  Weight: 203 lb (92.1 kg)     Physical Exam  Constitutional: She appears well-developed and well-nourished. No distress.  HENT:  Head: Normocephalic and atraumatic.  Right Ear: Hearing, tympanic membrane, external ear and ear canal normal.  Left Ear: Hearing, tympanic membrane, external ear and ear canal normal.  Nose: Nose normal.  Mouth/Throat: Uvula is midline, oropharynx is clear and moist and mucous membranes are normal. No oropharyngeal exudate.  Eyes: Pupils are equal, round, and reactive to light. Conjunctivae are normal. Right eye exhibits no discharge. Left eye exhibits no discharge. No scleral icterus.  Neck: Normal range of motion. Neck supple. No  tracheal deviation present. No thyromegaly present.  Cardiovascular: Normal rate, regular rhythm and normal heart sounds. Exam reveals no gallop and no friction rub.  No murmur heard. Pulmonary/Chest: Effort normal. No stridor. No respiratory distress. She has wheezes in the right lower field and the left lower field. She has no rales.  Lymphadenopathy:    She has no cervical adenopathy.  Skin: Skin is warm and dry. She is not diaphoretic.  Vitals reviewed.      Assessment & Plan:     1. Bronchitis Worsening symptoms that have not responded to OTC medications. Will give Zpak as below. Tessalon perles for cough. Albuterol inhaler refilled. Continue allergy medications. Stay well hydrated and get plenty of rest. Call if no symptom improvement or if symptoms worsen. - azithromycin (ZITHROMAX) 250 MG tablet; Take 2 tablets PO on day one, and one tablet PO daily thereafter until completed.  Dispense: 6 tablet; Refill: 0 - benzonatate (TESSALON) 100 MG capsule; Take 1 capsule (100 mg total) by mouth 2 (two) times daily as needed for cough.  Dispense: 20 capsule; Refill: 0 - albuterol (PROVENTIL HFA;VENTOLIN HFA) 108 (90 Base) MCG/ACT inhaler; Inhale 2 puffs into the lungs every 6 (six) hours as needed for wheezing or shortness of breath.  Dispense: 1 Inhaler; Refill: 2  2. Arnold-Chiari malformation ([place]) Patient reports snoring loudly, enough so to wake herself up. Worsening daytime somnolence as well. She also has awoken herself gasping for air. She has asked her husband if he has noticed any witnessed apneas but he has not noticed yet, but reports he goes to sleep before she does most nights. She is requesting a sleep study and I do not feel this is inappropriate barring her symptoms. I will order as below.  - Ambulatory referral to Sleep Studies  3. Snoring See above medical treatment plan. - Ambulatory referral to Sleep Studies  4. Daytime somnolence See above medical treatment plan. -  Ambulatory referral to Sleep Studies  5. Gasping for breath See above medical treatment plan. - Ambulatory referral to Sleep Studies       Mar Daring, PA-C  [facility]

## 2018-04-28 NOTE — Patient Instructions (Signed)

## 2018-05-04 ENCOUNTER — Telehealth: Payer: Self-pay | Admitting: Physician Assistant

## 2018-05-04 NOTE — Telephone Encounter (Signed)
FYI--Per [facility] pt refused appointment for sleep study

## 2018-06-14 ENCOUNTER — Encounter: Payer: Federal, State, Local not specified - PPO | Admitting: Physician Assistant

## 2018-06-22 DIAGNOSIS — F411 Generalized anxiety disorder: Secondary | ICD-10-CM | POA: Diagnosis not present

## 2018-06-28 ENCOUNTER — Ambulatory Visit (INDEPENDENT_AMBULATORY_CARE_PROVIDER_SITE_OTHER): Payer: Federal, State, Local not specified - PPO | Admitting: Physician Assistant

## 2018-06-28 ENCOUNTER — Encounter: Payer: Self-pay | Admitting: Physician Assistant

## 2018-06-28 ENCOUNTER — Other Ambulatory Visit: Payer: Self-pay | Admitting: Physician Assistant

## 2018-06-28 VITALS — BP 128/82 | HR 88 | Temp 98.6°F | Wt 205.0 lb

## 2018-06-28 DIAGNOSIS — D508 Other iron deficiency anemias: Secondary | ICD-10-CM

## 2018-06-28 DIAGNOSIS — E559 Vitamin D deficiency, unspecified: Secondary | ICD-10-CM

## 2018-06-28 DIAGNOSIS — M542 Cervicalgia: Secondary | ICD-10-CM | POA: Diagnosis not present

## 2018-06-28 DIAGNOSIS — Z111 Encounter for screening for respiratory tuberculosis: Secondary | ICD-10-CM

## 2018-06-28 DIAGNOSIS — F419 Anxiety disorder, unspecified: Secondary | ICD-10-CM

## 2018-06-28 DIAGNOSIS — F331 Major depressive disorder, recurrent, moderate: Secondary | ICD-10-CM

## 2018-06-28 DIAGNOSIS — M5412 Radiculopathy, cervical region: Secondary | ICD-10-CM | POA: Diagnosis not present

## 2018-06-28 DIAGNOSIS — Q0700 Arnold-Chiari syndrome without spina bifida or hydrocephalus: Secondary | ICD-10-CM

## 2018-06-28 DIAGNOSIS — Z0000 Encounter for general adult medical examination without abnormal findings: Secondary | ICD-10-CM

## 2018-06-28 DIAGNOSIS — Z1239 Encounter for other screening for malignant neoplasm of breast: Secondary | ICD-10-CM

## 2018-06-28 DIAGNOSIS — E538 Deficiency of other specified B group vitamins: Secondary | ICD-10-CM | POA: Diagnosis not present

## 2018-06-28 DIAGNOSIS — G479 Sleep disorder, unspecified: Secondary | ICD-10-CM

## 2018-06-28 DIAGNOSIS — M5413 Radiculopathy, cervicothoracic region: Secondary | ICD-10-CM

## 2018-06-28 DIAGNOSIS — Z1159 Encounter for screening for other viral diseases: Secondary | ICD-10-CM

## 2018-06-28 MED ORDER — CYCLOBENZAPRINE HCL 10 MG PO TABS: 10.0000 mg | ORAL_TABLET | Freq: Three times a day (TID) | ORAL | 1 refills | Status: DC | PRN

## 2018-06-28 MED ORDER — GABAPENTIN 300 MG PO CAPS: 300.0000 mg | ORAL_CAPSULE | Freq: Two times a day (BID) | ORAL | 1 refills | Status: DC

## 2018-06-28 MED ORDER — ALPRAZOLAM 0.5 MG PO TABS: 0.5000 mg | ORAL_TABLET | Freq: Two times a day (BID) | ORAL | 1 refills | Status: DC | PRN

## 2018-06-28 MED ORDER — CITALOPRAM HYDROBROMIDE 40 MG PO TABS: 40.0000 mg | ORAL_TABLET | Freq: Every day | ORAL | 1 refills | Status: DC

## 2018-06-28 MED ORDER — TRAZODONE HCL 50 MG PO TABS: 25.0000 mg | ORAL_TABLET | Freq: Every evening | ORAL | 3 refills | Status: DC | PRN

## 2018-06-28 NOTE — Patient Instructions (Signed)
Cervical Radiculopathy Cervical radiculopathy happens when a nerve in the neck (cervical nerve) is pinched or bruised. This condition can develop because of an injury or as part of the normal aging process. Pressure on the cervical nerves can cause pain or numbness that runs from the neck all the way down into the arm and fingers. Usually, this condition gets better with rest. Treatment may be needed if the condition does not improve. What are the causes? This condition may be caused by:  Injury.  Slipped (herniated) disk.  Muscle tightness in the neck because of overuse.  Arthritis.  Breakdown or degeneration in the bones and joints of the spine (spondylosis) due to aging.  Bone spurs that may develop near the cervical nerves.  What are the signs or symptoms? Symptoms of this condition include:  Pain that runs from the neck to the arm and hand. The pain can be severe or irritating. It may be worse when the neck is moved.  Numbness or weakness in the affected arm and hand.  How is this diagnosed? This condition may be diagnosed based on symptoms, medical history, and a physical exam. You may also have tests, including:  X-rays.  CT scan.  MRI.  Electromyogram (EMG).  Nerve conduction tests.  How is this treated? In many cases, treatment is not needed for this condition. With rest, the condition usually gets better over time. If treatment is needed, options may include:  Wearing a soft neck collar for short periods of time.  Physical therapy to strengthen your neck muscles.  Medicines, such as NSAIDs, oral corticosteroids, or spinal injections.  Surgery. This may be needed if other treatments do not help. Various types of surgery may be done depending on the cause of your problems.  Follow these instructions at home: Managing pain  Take over-the-counter and prescription medicines only as told by your health care provider.  If directed, apply ice to the affected  area. ? Put ice in a plastic bag. ? Place a towel between your skin and the bag. ? Leave the ice on for 20 minutes, 2-3 times per day.  If ice does not help, you can try using heat. Take a warm shower or warm bath, or use a heat pack as told by your health care provider.  Try a gentle neck and shoulder massage to help relieve symptoms. Activity  Rest as needed. Follow instructions from your health care provider about any restrictions on activities.  Do stretching and strengthening exercises as told by your health care provider or physical therapist. General instructions  If you were given a soft collar, wear it as told by your health care provider.  Use a flat pillow when you sleep.  Keep all follow-up visits as told by your health care provider. This is important. Contact a health care provider if:  Your condition does not improve with treatment. Get help right away if:  Your pain gets much worse and cannot be controlled with medicines.  You have weakness or numbness in your hand, arm, face, or leg.  You have a high fever.  You have a stiff, rigid neck.  You lose control of your bowels or your bladder (have incontinence).  You have trouble with walking, balance, or speaking. This information is not intended to replace advice given to you by your health care provider. Make sure you discuss any questions you have with your health care provider. Document Released: 06/01/2001 Document Revised: 02/12/2016 Document Reviewed: 10/31/2014 Elsevier Interactive Patient Education    2018 Elsevier Inc.  

## 2018-06-28 NOTE — Progress Notes (Signed)
Patient: Virginia Woods, Female    DOB: 03-10-1971, 47 y.o.   MRN: [id number] Visit Date: 06/28/2018  Today's Provider: Mar Daring, PA-C   Chief Complaint  Patient presents with  . Annual Exam  . Shoulder Pain    Patient presents today with right shoulder pain and states the pain goes down to upper right back.   Subjective:  Shoulder Pain   The pain is present in the neck, back, right arm and right shoulder. This is a recurrent problem. The current episode started 1 to 4 weeks ago. The problem occurs intermittently. The problem has been gradually worsening. The quality of the pain is described as burning. The pain is at a severity of 5/10. The pain is moderate. Associated symptoms include numbness.   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 She reports she wants to work, but that she is sitting in a small chair having to stare down at the computer screen or look down over children's shoulders to assist them. She feels this is what has exacerbated her arm/shoulder/neck pain currently. She was previously seen by Dr. Mack Guise. He had wanted to get an MRI of her cervical spine. She never had done.     Annual physical exam Virginia Woods is a 47 y.o. female who presents today for health maintenance and complete physical. She feels fairly well. She reports exercising none. She reports she is sleeping poorly. -----------------------------------------------------------------   Review of Systems  Constitutional: Positive for fatigue.  HENT: Negative.   Eyes: Negative.   Respiratory: Negative.   Cardiovascular: Positive for palpitations.  Gastrointestinal: Negative.   Endocrine: Negative.   Genitourinary: Negative.   Musculoskeletal: Positive for neck pain and neck stiffness.  Skin: Negative.   Allergic/Immunologic: Negative.   Neurological: Positive for numbness and headaches.  Hematological: Negative.     Psychiatric/Behavioral: The patient is nervous/anxious.     Social History She  reports that she has never smoked. She has never used smokeless tobacco. She reports that she drinks alcohol. She reports that she does not use drugs. Social History   Socioeconomic History  . Marital status: Married    Spouse name: Not on file  . Number of children: Not on file  . Years of education: Not on file  . Highest education level: Not on file  Occupational History  . Not on file  Social Needs  . Financial resource strain: Not on file  . Food insecurity:    Worry: Not on file    Inability: Not on file  . Transportation needs:    Medical: Not on file    Non-medical: Not on file  Tobacco Use  . Smoking status: Never Smoker  . Smokeless tobacco: Never Used  Substance and Sexual Activity  . Alcohol use: Yes    Comment: 1-2 glasses wine per month  . Drug use: No  . Sexual activity: Not on file  Lifestyle  . Physical activity:    Days per week: Not on file    Minutes per session: Not on file  . Stress: Not on file  Relationships  . Social connections:    Talks on phone: Not on file    Gets together: Not on file    Attends religious service: Not on file    Active member of club or organization: Not on file    Attends meetings of clubs or organizations: Not on file  Relationship status: Not on file  Other Topics Concern  . Not on file  Social History Narrative  . Not on file    Patient Active Problem List   Diagnosis Date Noted  . 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 Allergic rhinitis 05/20/2015  . Arnold-Chiari malformation ([place]) 05/20/2015  . Chronic headache 05/20/2015  . Chronic pain 05/20/2015  . Clinical depression 05/20/2015  . Dermatitis, eczematoid 05/20/2015  . Acute Lyme disease 05/20/2015  . Muscle ache 05/20/2015  . Apnea, sleep 05/20/2015  . B12 deficiency 05/20/2015  . Avitaminosis D 05/20/2015  . Hypo-osmolality and hyponatremia 10/13/2009  .  Anemia, iron deficiency 05/16/2009  . Cannot sleep 04/18/2008  . Anxiety 11/05/2004    Past Surgical History:  Procedure Laterality Date  . BRAIN SURGERY    . CESAREAN SECTION  1996  . TUBAL LIGATION  2002    Family History  Family Status  Relation Name Status  . Mother  Alive  . Father  Deceased       Kidney failure, kidney transplant  . Sister  Alive  . Ethlyn Daniels  Alive       Brain aneurysm  . MGM  Deceased       MI  . PGM  Deceased       Brain aneurysm   Her family history includes Cancer in her mother; Heart disease in her maternal grandmother; Hypertension in her mother and sister.     Allergies  Allergen Reactions  . Doxycycline Itching and Rash    Previous Medications   ALBUTEROL (PROVENTIL HFA;VENTOLIN HFA) 108 (90 BASE) MCG/ACT INHALER    Inhale 2 puffs into the lungs every 6 (six) hours as needed for wheezing or shortness of breath.   ALPRAZOLAM (XANAX) 0.5 MG TABLET    Take 1 tablet (0.5 mg total) by mouth 2 (two) times daily as needed.   ASCORBIC ACID (VITAMIN C PO)    Take by mouth.   AZITHROMYCIN (ZITHROMAX) 250 MG TABLET    Take 2 tablets PO on day one, and one tablet PO daily thereafter until completed.   BENZONATATE (TESSALON) 100 MG CAPSULE    Take 1 capsule (100 mg total) by mouth 2 (two) times daily as needed for cough.   CITALOPRAM (CELEXA) 40 MG TABLET    TAKE 1 TABLET BY MOUTH EVERY DAY   CYANOCOBALAMIN (VITAMIN B 12 PO)    Take by mouth.   CYCLOBENZAPRINE (FLEXERIL) 10 MG TABLET    Take 10 mg by mouth 3 (three) times daily as needed for muscle spasms.   DIPHENHYDRAMINE (BENADRYL) 25 MG TABLET    Take 25 mg by mouth at bedtime as needed for sleep.   GABAPENTIN (NEURONTIN) 300 MG CAPSULE    Take 300 mg by mouth 2 (two) times daily.    IBUPROFEN (ADVIL,MOTRIN) 600 MG TABLET    Take 1 tablet (600 mg total) by mouth every 6 (six) hours as needed.   LORATADINE (CLARITIN) 10 MG TABLET    Take 10 mg by mouth daily as needed.    MULTIPLE VITAMINS-MINERALS  (ZINC PO)    Take by mouth.   OMEGA-3 FATTY ACIDS PO       VALACYCLOVIR (VALTREX) 500 MG TABLET    Take 1 tablet (500 mg total) by mouth 2 (two) times daily.   VITAMIN D, ERGOCALCIFEROL, (DRISDOL) 50000 UNITS CAPS CAPSULE    TAKE ONE CAPSULE BY MOUTH VERY 7 DAYS    Patient Care Team: Mar Daring, [place] as  PCP - General (Family Medicine)      Objective:   Vitals: There were no vitals taken for this visit.   Physical Exam  Constitutional: She is oriented to person, place, and time. She appears well-developed and well-nourished. No distress.  HENT:  Head: Normocephalic and atraumatic.  Right Ear: External ear normal.  Left Ear: External ear normal.  Nose: Nose normal.  Mouth/Throat: Oropharynx is clear and moist. No oropharyngeal exudate.  Eyes: Pupils are equal, round, and reactive to light. Conjunctivae and EOM are normal. Right eye exhibits no discharge. Left eye exhibits no discharge. No scleral icterus.  Neck: Normal range of motion. Neck supple. No JVD present. No tracheal deviation present. No thyromegaly present.  Cardiovascular: Normal rate, regular rhythm, normal heart sounds and intact distal pulses. Exam reveals no gallop and no friction rub.  No murmur heard. Pulmonary/Chest: Effort normal and breath sounds normal. No respiratory distress. She has no wheezes. She has no rales. She exhibits no tenderness.  Abdominal: Soft. Bowel sounds are normal. She exhibits no distension and no mass. There is no tenderness. There is no rebound and no guarding.  Genitourinary: Vagina normal. There is no rash, tenderness, lesion or injury on the right labia. There is no rash, tenderness, lesion or injury on the left labia.  Musculoskeletal: Normal range of motion. She exhibits no edema or tenderness.  Lymphadenopathy:    She has no cervical adenopathy.  Neurological: She is alert and oriented to person, place, and time.  Skin: Skin is warm and dry. No rash noted. She is not  diaphoretic.  Psychiatric: She has a normal mood and affect. Her behavior is normal. Judgment and thought content normal.  Vitals reviewed.    Depression Screen PHQ 2/9 Scores 06/28/2018 11/23/2017 04/08/2017 11/04/2015  PHQ - 2 Score 1 1 1  -  PHQ- 9 Score 2 - 5 -  Exception Documentation - - - Patient refusal      Assessment & Plan:     Routine Health Maintenance and Physical Exam  Exercise Activities and Dietary recommendations Goals   None     Immunization History  Administered Date(s) Administered  . Tdap 06/08/2010    Health Maintenance  Topic Date Due  . HIV Screening  03/25/1985  . INFLUENZA VACCINE  05/20/2019 (Originally 04/20/2018)  . PAP SMEAR  11/03/2018  . TETANUS/TDAP  06/08/2020     Discussed health benefits of physical activity, and encouraged her to engage in regular exercise appropriate for her age and condition.    1. Annual physical exam Normal physical exam today. Will check labs as below and f/u pending lab results. If labs are stable and WNL she will not need to have these rechecked for one year at her next annual physical exam. She is to call the office in the meantime if she has any acute issue, questions or concerns. - CBC with Differential/Platelet - Comprehensive metabolic panel - Hemoglobin A1c - Lipid panel - TSH  2. Breast cancer screening Breast exam today was normal. There is no family history of breast cancer. She does perform regular self breast exams. Mammogram was ordered as below. Information for [facility] was given to patient so she may schedule her mammogram at her convenience. - MM 3D SCREEN BREAST BILATERAL; Future  3. Arnold-Chiari malformation (HCC) Stable. Diagnosis pulled for medication refill. Continue current medical treatment plan. - cyclobenzaprine (FLEXERIL) 10 MG tablet; Take 1 tablet (10 mg total) by mouth 3 (three) times daily as needed for muscle  spasms.  Dispense: 270 tablet; Refill: 1 - gabapentin  (NEURONTIN) 300 MG capsule; Take 1 capsule (300 mg total) by mouth 2 (two) times daily.  Dispense: 180 capsule; Refill: 1  4. Radiculopathy of cervicothoracic region Worsening. Advised to call Dr. Alfonso Ramus office back to schedule and try to reschedule the MRI. She voiced understanding.  - gabapentin (NEURONTIN) 300 MG capsule; Take 1 capsule (300 mg total) by mouth 2 (two) times daily.  Dispense: 180 capsule; Refill: 1  5. Avitaminosis D H/O this. On high dose supplementation. Will check labs as below and f/u pending results. - Vitamin D (25 hydroxy)  6. B12 deficiency H/O this. Will check labs as below and f/u pending results. - B12  7. Other iron deficiency anemia H/O this. Will check labs as below and f/u pending results. - Fe+TIBC+Fer  8. Moderate episode of recurrent major depressive disorder (HCC) Stable. Diagnosis pulled for medication refill. Continue current medical treatment plan. - citalopram (CELEXA) 40 MG tablet; Take 1 tablet (40 mg total) by mouth daily.  Dispense: 90 tablet; Refill: 1  9. Anxiety Stable. Diagnosis pulled for medication refill. Continue current medical treatment plan. - ALPRAZolam (XANAX) 0.5 MG tablet; Take 1 tablet (0.5 mg total) by mouth 2 (two) times daily as needed.  Dispense: 180 tablet; Refill: 1  10. Difficulty sleeping Worsening since she started back work. Previously used [place]. Will try trazodone as below. She is to call if still not sleeping well.  - traZODone (DESYREL) 50 MG tablet; Take 0.5-1 tablets (25-50 mg total) by mouth at bedtime as needed for sleep.  Dispense: 30 tablet; Refill: 3  11. Screening for measles Will check labs as below and f/u pending results. - Measles/Mumps/Rubella Immunity  12. Screening-pulmonary TB Will check labs as below and f/u pending results. - QuantiFERON-TB Gold Plus  --------------------------------------------------------------------

## 2018-06-29 ENCOUNTER — Telehealth: Payer: Self-pay

## 2018-06-29 DIAGNOSIS — E559 Vitamin D deficiency, unspecified: Secondary | ICD-10-CM

## 2018-06-29 MED ORDER — VITAMIN D (ERGOCALCIFEROL) 1.25 MG (50000 UNIT) PO CAPS: ORAL_CAPSULE | ORAL | 1 refills | Status: DC

## 2018-06-29 NOTE — Telephone Encounter (Signed)
Patient advised as directed below. Scheduled her for a 3 month follow-up in January. Patient is requesting RX for the Vitamin D

## 2018-06-29 NOTE — Telephone Encounter (Signed)
refilled 

## 2018-06-29 NOTE — Telephone Encounter (Signed)
-----   Message from Mar Daring, PA-C sent at 06/29/2018  4:19 PM EDT ----- Iron is low. Would recommend to start ferrous sulfate or ferrous gluconate 325mg  BID. Take with food as this may cause upset stomach. Kidney and liver function normal. Cholesterol normal. Sugar normal. Thyroid normal. Vit D low so make sure to take the high dose Vit D supplement Rx once weekly. B12 normal. You are immune to measles, mumps and rubella for employment. Recheck iron in 3 months.

## 2018-07-01 LAB — QUANTIFERON-TB GOLD PLUS
QUANTIFERON NIL VALUE: 0.02 [IU]/mL
QUANTIFERON TB1 AG VALUE: 0.07 [IU]/mL
QUANTIFERON-TB GOLD PLUS: NEGATIVE
QuantiFERON TB2 Ag Value: 0.03 IU/mL

## 2018-07-01 LAB — CBC WITH DIFFERENTIAL/PLATELET
BASOS: 1 %
Basophils Absolute: 0 10*3/uL (ref 0.0–0.2)
EOS (ABSOLUTE): 0.1 10*3/uL (ref 0.0–0.4)
EOS: 2 %
HEMATOCRIT: 38.1 % (ref 34.0–46.6)
HEMOGLOBIN: 12.1 g/dL (ref 11.1–15.9)
Immature Grans (Abs): 0 10*3/uL (ref 0.0–0.1)
Immature Granulocytes: 0 %
LYMPHS ABS: 1.7 10*3/uL (ref 0.7–3.1)
Lymphs: 35 %
MCH: 23.6 pg — AB (ref 26.6–33.0)
MCHC: 31.8 g/dL (ref 31.5–35.7)
MCV: 74 fL — AB (ref 79–97)
MONOCYTES: 7 %
Monocytes Absolute: 0.3 10*3/uL (ref 0.1–0.9)
NEUTROS ABS: 2.6 10*3/uL (ref 1.4–7.0)
Neutrophils: 55 %
Platelets: 301 10*3/uL (ref 150–450)
RBC: 5.12 x10E6/uL (ref 3.77–5.28)
RDW: 13.6 % (ref 12.3–15.4)
WBC: 4.8 10*3/uL (ref 3.4–10.8)

## 2018-07-01 LAB — IRON,TIBC AND FERRITIN PANEL
Ferritin: 13 ng/mL — ABNORMAL LOW (ref 15–150)
Iron Saturation: 7 % — CL (ref 15–55)
Iron: 26 ug/dL — ABNORMAL LOW (ref 27–159)
TIBC: 400 ug/dL (ref 250–450)
UIBC: 374 ug/dL (ref 131–425)

## 2018-07-01 LAB — HEMOGLOBIN A1C
ESTIMATED AVERAGE GLUCOSE: 111 mg/dL
HEMOGLOBIN A1C: 5.5 % (ref 4.8–5.6)

## 2018-07-01 LAB — MEASLES/MUMPS/RUBELLA IMMUNITY
MUMPS ABS, IGG: 126 [AU]/ml (ref >10.9)
RUBEOLA AB, IGG: 85.3 [AU]/ml (ref >16.4)
Rubella Antibodies, IGG: 3.67 index (ref >0.99)

## 2018-07-01 LAB — COMPREHENSIVE METABOLIC PANEL
ALBUMIN: 4.2 g/dL (ref 3.5–5.5)
ALK PHOS: 66 IU/L (ref 39–117)
ALT: 11 IU/L (ref 0–32)
AST: 15 IU/L (ref 0–40)
Albumin/Globulin Ratio: 1.8 (ref 1.2–2.2)
BUN / CREAT RATIO: 9 (ref 9–23)
BUN: 7 mg/dL (ref 6–24)
CHLORIDE: 100 mmol/L (ref 96–106)
CO2: 24 mmol/L (ref 20–29)
Calcium: 9.2 mg/dL (ref 8.7–10.2)
Creatinine, Ser: 0.75 mg/dL (ref 0.57–1.00)
GFR calc non Af Amer: 95 mL/min/{1.73_m2} (ref >59)
GFR, EST AFRICAN AMERICAN: 109 mL/min/{1.73_m2} (ref >59)
GLOBULIN, TOTAL: 2.4 g/dL (ref 1.5–4.5)
Glucose: 106 mg/dL — ABNORMAL HIGH (ref 65–99)
POTASSIUM: 3.6 mmol/L (ref 3.5–5.2)
SODIUM: 141 mmol/L (ref 134–144)
Total Protein: 6.6 g/dL (ref 6.0–8.5)

## 2018-07-01 LAB — LIPID PANEL
CHOLESTEROL TOTAL: 132 mg/dL (ref 100–199)
Chol/HDL Ratio: 2.6 ratio (ref 0.0–4.4)
HDL: 51 mg/dL (ref >39)
LDL Calculated: 63 mg/dL (ref 0–99)
Triglycerides: 91 mg/dL (ref 0–149)
VLDL CHOLESTEROL CAL: 18 mg/dL (ref 5–40)

## 2018-07-01 LAB — VITAMIN D 25 HYDROXY (VIT D DEFICIENCY, FRACTURES): VIT D 25 HYDROXY: 20.6 ng/mL — AB (ref 30.0–100.0)

## 2018-07-01 LAB — TSH: TSH: 1.09 u[IU]/mL (ref 0.450–4.500)

## 2018-07-01 LAB — VITAMIN B12: VITAMIN B 12: 1244 pg/mL (ref 232–1245)

## 2018-07-19 DIAGNOSIS — F411 Generalized anxiety disorder: Secondary | ICD-10-CM | POA: Diagnosis not present

## 2018-07-20 ENCOUNTER — Other Ambulatory Visit: Payer: Self-pay | Admitting: Physician Assistant

## 2018-07-20 DIAGNOSIS — G479 Sleep disorder, unspecified: Secondary | ICD-10-CM

## 2018-07-20 NOTE — Telephone Encounter (Signed)
Jenni's patient

## 2018-08-04 DIAGNOSIS — M5021 Other cervical disc displacement,  high cervical region: Secondary | ICD-10-CM | POA: Diagnosis not present

## 2018-08-19 ENCOUNTER — Encounter: Payer: Self-pay | Admitting: Emergency Medicine

## 2018-08-19 ENCOUNTER — Other Ambulatory Visit: Payer: Self-pay

## 2018-08-19 ENCOUNTER — Ambulatory Visit
Admission: EM | Admit: 2018-08-19 | Discharge: 2018-08-19 | Disposition: A | Discharge status: Home / Self Care | Payer: Federal, State, Local not specified - PPO | Attending: Family Medicine | Admitting: Family Medicine

## 2018-08-19 DIAGNOSIS — R69 Illness, unspecified: Secondary | ICD-10-CM

## 2018-08-19 DIAGNOSIS — J111 Influenza due to unidentified influenza virus with other respiratory manifestations: Secondary | ICD-10-CM

## 2018-08-19 LAB — RAPID INFLUENZA A&B ANTIGENS (ARMC ONLY)
INFLUENZA A (ARMC): NEGATIVE
INFLUENZA B (ARMC): NEGATIVE

## 2018-08-19 MED ORDER — HYDROCOD POLST-CPM POLST ER 10-8 MG/5ML PO SUER: 5.0000 mL | Freq: Two times a day (BID) | ORAL | 0 refills | Status: DC | PRN

## 2018-08-19 MED ORDER — OSELTAMIVIR PHOSPHATE 75 MG PO CAPS: 75.0000 mg | ORAL_CAPSULE | Freq: Two times a day (BID) | ORAL | 0 refills | Status: DC

## 2018-08-19 NOTE — ED Provider Notes (Signed)
[facility]    CSN: [id number] Arrival date & time: 08/19/18  0803  History   Chief Complaint Chief Complaint  Patient presents with  . Fever  . Cough   HPI   47 year old female presents with concerns for flu.  Patient reports that her symptoms started yesterday.  She has had recent sick contacts with the flu.  She reports fever, body aches, cough, chest tightness, fatigue.  Fever has been as high as 100.7.  She has taken Motrin and had a homeopathic remedy without resolution.  Has also used her inhaler. No known exacerbating factors.  No relieving factors.  No other associated symptoms.  No other complaints.  Past Medical History:  Diagnosis Date  . Anemia   . Anxiety   . Depression   . Vitamin B12 deficiency   . Vitamin D deficiency    Patient Active Problem List   Diagnosis Date Noted  . Radiculopathy of cervicothoracic region 06/28/2018  . 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 Allergic rhinitis 05/20/2015  . Arnold-Chiari malformation ([place]) 05/20/2015  . Chronic headache 05/20/2015  . Chronic pain 05/20/2015  . Clinical depression 05/20/2015  . Dermatitis, eczematoid 05/20/2015  . Acute Lyme disease 05/20/2015  . Muscle ache 05/20/2015  . Apnea, sleep 05/20/2015  . B12 deficiency 05/20/2015  . Avitaminosis D 05/20/2015  . Hypo-osmolality and hyponatremia 10/13/2009  . Anemia, iron deficiency 05/16/2009  . Cannot sleep 04/18/2008  . Anxiety 11/05/2004   Past Surgical History:  Procedure Laterality Date  . BRAIN SURGERY    . CESAREAN SECTION  1996  . TUBAL LIGATION  2002   OB History   None    Home Medications    Prior to Admission medications   Medication Sig Start Date End Date Taking? Authorizing Provider  albuterol (PROVENTIL HFA;VENTOLIN HFA) 108 (90 Base) MCG/ACT inhaler Inhale 2 puffs into the lungs every 6 (six) hours as needed for wheezing or shortness of breath. 04/28/18  Yes Mar Daring, PA-C  ALPRAZolam Duanne Moron) 0.5  MG tablet Take 1 tablet (0.5 mg total) by mouth 2 (two) times daily as needed. 06/28/18  Yes Mar Daring, PA-C  Ascorbic Acid (VITAMIN C PO) Take by mouth.   Yes [provider]  citalopram (CELEXA) 40 MG tablet Take 1 tablet (40 mg total) by mouth daily. 06/28/18  Yes Mar Daring, PA-C  Cyanocobalamin (VITAMIN B 12 PO) Take by mouth.   Yes [provider]  cyclobenzaprine (FLEXERIL) 10 MG tablet Take 1 tablet (10 mg total) by mouth 3 (three) times daily as needed for muscle spasms. 06/28/18  Yes Mar Daring, PA-C  diphenhydrAMINE (BENADRYL) 25 MG tablet Take 25 mg by mouth at bedtime as needed for sleep.   Yes [provider]  gabapentin (NEURONTIN) 300 MG capsule Take 1 capsule (300 mg total) by mouth 2 (two) times daily. 06/28/18  Yes Mar Daring, PA-C  loratadine (CLARITIN) 10 MG tablet Take 10 mg by mouth daily as needed.    Yes [provider]  Multiple Vitamins-Minerals (ZINC PO) Take by mouth.   Yes [provider]  OMEGA-3 FATTY ACIDS PO  05/01/09  Yes [provider]  traZODone (DESYREL) 50 MG tablet TAKE HALF TO ONE TABLET BY MOUTH AT BEDTIME AS NEEDED FOR SLEEP. 07/20/18  Yes Bacigalupo, Dionne Bucy, MD  valACYclovir (VALTREX) 500 MG tablet Take 1 tablet (500 mg total) by mouth 2 (two) times daily. 03/07/17  Yes Mar Daring, PA-C  Vitamin D,  Ergocalciferol, (DRISDOL) 50000 units CAPS capsule TAKE ONE CAPSULE BY MOUTH VERY 7 DAYS 06/29/18  Yes Burnette, Clearnce Sorrel, PA-C  chlorpheniramine-HYDROcodone (TUSSIONEX PENNKINETIC ER) 10-8 MG/5ML SUER Take 5 mLs by mouth every 12 (twelve) hours as needed. 08/19/18   Coral Spikes, DO  oseltamivir (TAMIFLU) 75 MG capsule Take 1 capsule (75 mg total) by mouth every 12 (twelve) hours. 08/19/18   Coral Spikes, DO    Family History Family History  Problem Relation Age of Onset  . Hypertension Mother   . Cancer Mother        Laryngeal and lymphoma  .  Hypertension Sister   . Heart disease Maternal Grandmother     Social History Social History   Tobacco Use  . Smoking status: Never Smoker  . Smokeless tobacco: Never Used  Substance Use Topics  . Alcohol use: Yes    Comment: 1-2 glasses wine per month  . Drug use: No   Allergies   Doxycycline  Review of Systems Review of Systems  Constitutional: Positive for chills, fatigue and fever.  Respiratory: Positive for cough and chest tightness.   Musculoskeletal:       Body aches.   Physical Exam Triage Vital Signs ED Triage Vitals  Enc Vitals Group     BP 08/19/18 0818 127/61     Pulse Rate 08/19/18 0818 83     Resp 08/19/18 0818 18     Temp 08/19/18 0818 98.2 F (36.8 C)     Temp Source 08/19/18 0818 Oral     SpO2 08/19/18 0818 100 %     Weight 08/19/18 0813 204 lb (92.5 kg)     Height 08/19/18 0813 5\' 5"  (1.651 m)     Head Circumference --      Peak Flow --      Pain Score 08/19/18 0813 6     Pain Loc --      Pain Edu? --      Excl. in [place]? --    Updated Vital Signs BP 127/61 (BP Location: Left Arm)   Pulse 83   Temp 98.2 F (36.8 C) (Oral)   Resp 18   Ht 5\' 5"  (1.651 m)   Wt 92.5 kg   LMP 08/03/2018   SpO2 100%   BMI 33.95 kg/m   Visual Acuity Right Eye Distance:   Left Eye Distance:   Bilateral Distance:    Right Eye Near:   Left Eye Near:    Bilateral Near:     Physical Exam  Constitutional: She is oriented to person, place, and time. She appears well-developed. No distress.  HENT:  Head: Normocephalic and atraumatic.  Mouth/Throat: Oropharynx is clear and moist.  Cardiovascular: Normal rate and regular rhythm.  Pulmonary/Chest: Effort normal and breath sounds normal. She has no wheezes. She has no rales.  Neurological: She is alert and oriented to person, place, and time.  Psychiatric: She has a normal mood and affect. Her behavior is normal.  Nursing note and vitals reviewed.  UC Treatments / Results  Labs (all labs ordered are listed,  but only abnormal results are displayed) Labs Reviewed  RAPID INFLUENZA A&B ANTIGENS ([place])    EKG None  Radiology No results found.  Procedures Procedures (including critical care time)  Medications Ordered in UC Medications - No data to display  Initial Impression / Assessment and Plan / UC Course  I have reviewed the triage vital signs and the nursing notes.  Pertinent labs & imaging results  that were available during my care of the patient were reviewed by me and considered in my medical decision making (see chart for details).    47 year old female presents with an influenza-like illness.  Testing was negative here, however she clinically is to have the flu.  Is on Tamiflu.  Tussionex for cough.  Tylenol & Motrin as needed.  Work note given.   Final Clinical Impressions(s) / UC Diagnoses   Final diagnoses:  Influenza-like illness     Discharge Instructions     Rest. Fluids.  Tylenol and motrin as needed for fever.  Tamiflu as prescribed. Tussionex for cough.  Take care  Dr. Lacinda Axon   ED Prescriptions    Medication Sig Dispense Auth. Provider   oseltamivir (TAMIFLU) 75 MG capsule Take 1 capsule (75 mg total) by mouth every 12 (twelve) hours. 10 capsule Lacinda Axon, Yaelis Scharfenberg G, DO   chlorpheniramine-HYDROcodone (TUSSIONEX PENNKINETIC ER) 10-8 MG/5ML SUER Take 5 mLs by mouth every 12 (twelve) hours as needed. 60 mL Coral Spikes, DO     Controlled Substance Prescriptions [place] Controlled Substance Registry consulted? Not Applicable   Coral Spikes, [place] 08/19/18 1173

## 2018-08-19 NOTE — Discharge Instructions (Signed)
Rest. Fluids.  Tylenol and motrin as needed for fever.  Tamiflu as prescribed. Tussionex for cough.  Take care  Dr. Lacinda Axon

## 2018-08-19 NOTE — ED Triage Notes (Signed)
Pt c/o fever (100.7 yesterday),cough, headache, body and chills. Started yesterday. Grandson and daughter and family had the flu.

## 2018-08-21 ENCOUNTER — Ambulatory Visit: Payer: Federal, State, Local not specified - PPO | Admitting: Physician Assistant

## 2018-08-21 ENCOUNTER — Encounter: Payer: Self-pay | Admitting: Physician Assistant

## 2018-08-21 ENCOUNTER — Telehealth: Payer: Self-pay | Admitting: Physician Assistant

## 2018-08-21 VITALS — BP 100/60 | HR 94 | Temp 98.4°F | Resp 20 | Wt 202.0 lb

## 2018-08-21 DIAGNOSIS — J111 Influenza due to unidentified influenza virus with other respiratory manifestations: Secondary | ICD-10-CM | POA: Diagnosis not present

## 2018-08-21 DIAGNOSIS — R21 Rash and other nonspecific skin eruption: Secondary | ICD-10-CM

## 2018-08-21 NOTE — Progress Notes (Signed)
Acute Office Visit  Subjective:    Patient ID: Virginia Woods, female    DOB: [birth year], 47 y.o.   MRN: [id number]  Chief Complaint  Patient presents with  . URI    HPI Patient is in today for persistent cough, runny nose, fatigue and fever, patient was seen on 08/19/18 at [facility]. Patient reports that she is being treated for Flu, patient reports swab was negative but has been around family with flu.  Patient reports taking Tamiflu and reports rash around her neck since yesterday. Patient not sure if tamiflu is causing rash.   Past Medical History:  Diagnosis Date  . Anemia   . Anxiety   . Depression   . Vitamin B12 deficiency   . Vitamin D deficiency     Past Surgical History:  Procedure Laterality Date  . BRAIN SURGERY    . CESAREAN SECTION  1996  . TUBAL LIGATION  2002    Family History  Problem Relation Age of Onset  . Hypertension Mother   . Cancer Mother        Laryngeal and lymphoma  . Hypertension Sister   . Heart disease Maternal Grandmother     Social History   Socioeconomic History  . Marital status: Married    Spouse name: Not on file  . Number of children: Not on file  . Years of education: Not on file  . Highest education level: Not on file  Occupational History  . Not on file  Social Needs  . Financial resource strain: Not on file  . Food insecurity:    Worry: Not on file    Inability: Not on file  . Transportation needs:    Medical: Not on file    Non-medical: Not on file  Tobacco Use  . Smoking status: Never Smoker  . Smokeless tobacco: Never Used  Substance and Sexual Activity  . Alcohol use: Yes    Comment: 1-2 glasses wine per month  . Drug use: No  . Sexual activity: Not on file  Lifestyle  . Physical activity:    Days per week: Not on file    Minutes per session: Not on file  . Stress: Not on file  Relationships  . Social connections:    Talks on phone: Not on file    Gets together: Not on file   Attends religious service: Not on file    Active member of club or organization: Not on file    Attends meetings of clubs or organizations: Not on file    Relationship status: Not on file  . Intimate partner violence:    Fear of current or ex partner: Not on file    Emotionally abused: Not on file    Physically abused: Not on file    Forced sexual activity: Not on file  Other Topics Concern  . Not on file  Social History Narrative  . Not on file    Outpatient Medications Prior to Visit  Medication Sig Dispense Refill  . albuterol (PROVENTIL HFA;VENTOLIN HFA) 108 (90 Base) MCG/ACT inhaler Inhale 2 puffs into the lungs every 6 (six) hours as needed for wheezing or shortness of breath. 1 Inhaler 2  . ALPRAZolam (XANAX) 0.5 MG tablet Take 1 tablet (0.5 mg total) by mouth 2 (two) times daily as needed. 180 tablet 1  . Ascorbic Acid (VITAMIN C PO) Take by mouth.    . chlorpheniramine-HYDROcodone (TUSSIONEX PENNKINETIC ER) 10-8 MG/5ML SUER Take 5 mLs by mouth  every 12 (twelve) hours as needed. 60 mL 0  . citalopram (CELEXA) 40 MG tablet Take 1 tablet (40 mg total) by mouth daily. 90 tablet 1  . Cyanocobalamin (VITAMIN B 12 PO) Take by mouth.    . cyclobenzaprine (FLEXERIL) 10 MG tablet Take 1 tablet (10 mg total) by mouth 3 (three) times daily as needed for muscle spasms. 270 tablet 1  . diphenhydrAMINE (BENADRYL) 25 MG tablet Take 25 mg by mouth at bedtime as needed for sleep.    Marland Kitchen gabapentin (NEURONTIN) 300 MG capsule Take 1 capsule (300 mg total) by mouth 2 (two) times daily. 180 capsule 1  . loratadine (CLARITIN) 10 MG tablet Take 10 mg by mouth daily as needed.     . Multiple Vitamins-Minerals (ZINC PO) Take by mouth.    . OMEGA-3 FATTY ACIDS PO     . traZODone (DESYREL) 50 MG tablet TAKE HALF TO ONE TABLET BY MOUTH AT BEDTIME AS NEEDED FOR SLEEP. 90 tablet 2  . valACYclovir (VALTREX) 500 MG tablet Take 1 tablet (500 mg total) by mouth 2 (two) times daily. 14 tablet 3  . Vitamin D,  Ergocalciferol, (DRISDOL) 50000 units CAPS capsule TAKE ONE CAPSULE BY MOUTH VERY 7 DAYS 12 capsule 1  . oseltamivir (TAMIFLU) 75 MG capsule Take 1 capsule (75 mg total) by mouth every 12 (twelve) hours. 10 capsule 0   No facility-administered medications prior to visit.     Allergies  Allergen Reactions  . Doxycycline Itching and Rash  . Tamiflu [Oseltamivir Phosphate] Rash    Review of Systems  Constitutional: Positive for fever and malaise/fatigue.  HENT: Positive for congestion.   Respiratory: Positive for cough and shortness of breath. Negative for wheezing.   Cardiovascular: Negative.   Skin: Positive for rash.  Neurological: Positive for weakness and headaches.       Objective:    Physical Exam  Constitutional: She appears well-developed and well-nourished. She appears ill. No distress.  HENT:  Head: Normocephalic and atraumatic.  Right Ear: Hearing, tympanic membrane, external ear and ear canal normal.  Left Ear: Hearing, tympanic membrane, external ear and ear canal normal.  Nose: Nose normal.  Mouth/Throat: Uvula is midline, oropharynx is clear and moist and mucous membranes are normal. No oropharyngeal exudate.  Eyes: Pupils are equal, round, and reactive to light. Conjunctivae are normal. Right eye exhibits no discharge. Left eye exhibits no discharge. No scleral icterus.  Neck: Normal range of motion. Neck supple. No tracheal deviation present. No thyromegaly present.  Cardiovascular: Normal rate, regular rhythm and normal heart sounds. Exam reveals no gallop and no friction rub.  No murmur heard. Pulmonary/Chest: Effort normal and breath sounds normal. No stridor. No respiratory distress. She has no wheezes. She has no rales.  Lymphadenopathy:    She has no cervical adenopathy.  Skin: Skin is warm and dry. Rash noted. Rash is papular. She is not diaphoretic.     Vitals reviewed.   BP 100/60 (BP Location: Left Arm, Patient Position: Sitting, Cuff Size:  Normal)   Pulse 94   Temp 98.4 F (36.9 C) (Oral)   Resp 20   Wt 202 lb (91.6 kg)   LMP 08/03/2018   SpO2 98%   BMI 33.61 kg/m  Wt Readings from Last 3 Encounters:  08/21/18 202 lb (91.6 kg)  08/19/18 204 lb (92.5 kg)  06/28/18 205 lb (93 kg)    Health Maintenance Due  Topic Date Due  . HIV Screening  03/25/1985    There  are no preventive care reminders to display for this patient.   Lab Results  Component Value Date   TSH 1.090 06/28/2018   Lab Results  Component Value Date   WBC 4.8 06/28/2018   HGB 12.1 06/28/2018   HCT 38.1 06/28/2018   MCV 74 (L) 06/28/2018   PLT 301 06/28/2018   Lab Results  Component Value Date   NA 141 06/28/2018   K 3.6 06/28/2018   CO2 24 06/28/2018   GLUCOSE 106 (H) 06/28/2018   BUN 7 06/28/2018   CREATININE 0.75 06/28/2018   BILITOT <0.2 06/28/2018   ALKPHOS 66 06/28/2018   AST 15 06/28/2018   ALT 11 06/28/2018   PROT 6.6 06/28/2018   ALBUMIN 4.2 06/28/2018   CALCIUM 9.2 06/28/2018   ANIONGAP 8 01/04/2016   Lab Results  Component Value Date   CHOL 132 06/28/2018   Lab Results  Component Value Date   HDL 51 06/28/2018   Lab Results  Component Value Date   LDLCALC 63 06/28/2018   Lab Results  Component Value Date   TRIG 91 06/28/2018   Lab Results  Component Value Date   CHOLHDL 2.6 06/28/2018   Lab Results  Component Value Date   HGBA1C 5.5 06/28/2018       Assessment & Plan:   Stop Tamiflu due to rash. Advised to continue OTC treatments such as dayquil for symptoms. May use IBU intermittently for breakthrough fever and malaise. Call if symptoms worsen.   For rash may use OTC benadryl cream and mometasone. Call if worsening.   Problem List Items Addressed This Visit    None    Visit Diagnoses    Influenza    -  Primary   Rash           No orders of the defined types were placed in this encounter.  The entirety of the information documented in the History of Present Illness, Review of Systems  and Physical Exam were personally obtained by me. Portions of this information were initially documented by Lendon Ka, CMA and reviewed by me for thoroughness and accuracy.  Mar Daring, PA-C

## 2018-08-21 NOTE — Telephone Encounter (Signed)
Pt went to [facility] over the weekend with flu like symptoms.  Pt got a note to be excused from work until [employer].  Pt is running a fever and is needing a longer note.  Please advise.  Thanks, [employer]

## 2018-08-21 NOTE — Telephone Encounter (Signed)
She will need to be seen by [place] for a note from [place].

## 2018-08-21 NOTE — Patient Instructions (Signed)

## 2018-08-29 ENCOUNTER — Ambulatory Visit: Payer: Self-pay | Admitting: Family Medicine

## 2018-08-30 ENCOUNTER — Encounter: Payer: Self-pay | Admitting: Family Medicine

## 2018-08-30 ENCOUNTER — Ambulatory Visit: Payer: Federal, State, Local not specified - PPO | Admitting: Family Medicine

## 2018-08-30 VITALS — BP 123/84 | HR 80 | Temp 98.6°F | Wt 202.2 lb

## 2018-08-30 DIAGNOSIS — L309 Dermatitis, unspecified: Secondary | ICD-10-CM

## 2018-08-30 DIAGNOSIS — B372 Candidiasis of skin and nail: Secondary | ICD-10-CM | POA: Diagnosis not present

## 2018-08-30 DIAGNOSIS — E860 Dehydration: Secondary | ICD-10-CM | POA: Diagnosis not present

## 2018-08-30 DIAGNOSIS — R002 Palpitations: Secondary | ICD-10-CM

## 2018-08-30 MED ORDER — CLOTRIMAZOLE-BETAMETHASONE 1-0.05 % EX CREA: 1.0000 "application " | TOPICAL_CREAM | Freq: Two times a day (BID) | CUTANEOUS | 1 refills | Status: DC

## 2018-08-30 MED ORDER — NYSTATIN 100000 UNIT/GM EX POWD: Freq: Three times a day (TID) | CUTANEOUS | 1 refills | Status: DC

## 2018-08-30 NOTE — Progress Notes (Signed)
Patient: Virginia Woods Female    DOB: 28-Jul-1971   47 y.o.   MRN: [id number] Visit Date: 08/30/2018  Today's Provider: Lavon Paganini, MD   Chief Complaint  Patient presents with  . URI   Subjective:    HPI  Upper Respiratory Infection  Patient presents today for URI for about 2 weeks with the following symptoms: congestion, cough and fatigue.  She was diagnosed with influenza on 08/21/2018.  She took Tamiflu, but developed a rash and so she stopped this.  She is feeling mostly bottle, with minimal residual cough, fatigue, and congestion.  She is able to go back to work 2 days ago.  On her way to work, she developed racing heart dizziness and a feeling she might pass out.  She drinks some water, turned on the air conditioning in her car, and ate some candy and felt better.  This happened again while she was at work.  She felt she may be dehydrated, so she drinks and electrolyte replacement drink.  She knows she has not been eating or drinking well while she had the flu.  She denies any further fevers.  She is feeling somewhat better today      Allergies  Allergen Reactions  . Doxycycline Itching and Rash  . Tamiflu [Oseltamivir Phosphate] Rash     Current Outpatient Medications:  .  albuterol (PROVENTIL HFA;VENTOLIN HFA) 108 (90 Base) MCG/ACT inhaler, Inhale 2 puffs into the lungs every 6 (six) hours as needed for wheezing or shortness of breath., Disp: 1 Inhaler, Rfl: 2 .  ALPRAZolam (XANAX) 0.5 MG tablet, Take 1 tablet (0.5 mg total) by mouth 2 (two) times daily as needed., Disp: 180 tablet, Rfl: 1 .  Ascorbic Acid (VITAMIN C PO), Take by mouth., Disp: , Rfl:  .  chlorpheniramine-HYDROcodone (TUSSIONEX PENNKINETIC ER) 10-8 MG/5ML SUER, Take 5 mLs by mouth every 12 (twelve) hours as needed., Disp: 60 mL, Rfl: 0 .  citalopram (CELEXA) 40 MG tablet, Take 1 tablet (40 mg total) by mouth daily., Disp: 90 tablet, Rfl: 1 .  Cyanocobalamin (VITAMIN B 12 PO), Take by  mouth., Disp: , Rfl:  .  cyclobenzaprine (FLEXERIL) 10 MG tablet, Take 1 tablet (10 mg total) by mouth 3 (three) times daily as needed for muscle spasms., Disp: 270 tablet, Rfl: 1 .  diphenhydrAMINE (BENADRYL) 25 MG tablet, Take 25 mg by mouth at bedtime as needed for sleep., Disp: , Rfl:  .  gabapentin (NEURONTIN) 300 MG capsule, Take 1 capsule (300 mg total) by mouth 2 (two) times daily., Disp: 180 capsule, Rfl: 1 .  loratadine (CLARITIN) 10 MG tablet, Take 10 mg by mouth daily as needed. , Disp: , Rfl:  .  Multiple Vitamins-Minerals (ZINC PO), Take by mouth., Disp: , Rfl:  .  OMEGA-3 FATTY ACIDS PO, , Disp: , Rfl:  .  traZODone (DESYREL) 50 MG tablet, TAKE HALF TO ONE TABLET BY MOUTH AT BEDTIME AS NEEDED FOR SLEEP., Disp: 90 tablet, Rfl: 2 .  valACYclovir (VALTREX) 500 MG tablet, Take 1 tablet (500 mg total) by mouth 2 (two) times daily., Disp: 14 tablet, Rfl: 3 .  Vitamin D, Ergocalciferol, (DRISDOL) 50000 units CAPS capsule, TAKE ONE CAPSULE BY MOUTH VERY 7 DAYS, Disp: 12 capsule, Rfl: 1  Review of Systems  Constitutional: Positive for fatigue.  HENT: Positive for congestion.   Respiratory: Positive for cough.   Cardiovascular: Positive for palpitations.  Genitourinary: Negative.   Neurological: Negative.  Social History   Tobacco Use  . Smoking status: Never Smoker  . Smokeless tobacco: Never Used  Substance Use Topics  . Alcohol use: Yes    Comment: 1-2 glasses wine per month   Objective:   BP 123/84 (BP Location: Left Arm, Patient Position: Sitting, Cuff Size: Normal)   Pulse 80   Temp 98.6 F (37 C) (Oral)   Wt 202 lb 3.2 oz (91.7 kg)   LMP 08/03/2018   SpO2 98%   BMI 33.65 kg/m  Vitals:   08/30/18 0848  BP: 123/84  Pulse: 80  Temp: 98.6 F (37 C)  TempSrc: Oral  SpO2: 98%  Weight: 202 lb 3.2 oz (91.7 kg)     Physical Exam  Constitutional: She is oriented to person, place, and time. She appears well-developed. No distress.  HENT:  Head: Normocephalic  and atraumatic.  Right Ear: External ear normal.  Left Ear: External ear normal.  Nose: Nose normal.  Mouth/Throat: Oropharynx is clear and moist. No oropharyngeal exudate.  Eyes: Pupils are equal, round, and reactive to light. Conjunctivae and EOM are normal. Right eye exhibits no discharge. Left eye exhibits no discharge. No scleral icterus.  Neck: Neck supple. No thyromegaly present.  Cardiovascular: Normal rate, regular rhythm, normal heart sounds and intact distal pulses.  No murmur heard. Pulmonary/Chest: Effort normal and breath sounds normal. No respiratory distress. She has no wheezes. She has no rales.  Abdominal: Soft. She exhibits no distension. There is no tenderness.  Musculoskeletal: She exhibits no edema.  Lymphadenopathy:    She has no cervical adenopathy.  Neurological: She is alert and oriented to person, place, and time.  Skin: Skin is warm and dry. She is not diaphoretic.  Sluggish capillary refill, decreased skin turgor Scaling rash with excoriations of neck Erythematous plaques underneath breasts  Psychiatric: She has a normal mood and affect. Her behavior is normal.  Vitals reviewed.       Assessment & Plan:   1. Dehydration 2. Palpitations - new problem - likely 2/2 decreased appetite and recent illness - encouraged hydration - discussed return precautions  3. Candidal intertrigo - rash under breasts c/w intertrigo - treat with nystatin - no signs of infection - discussed return precautions  4. Eczema, unspecified type - rash around neck has some eczematous areas, but also areas that appear fungal.  She will use Lotrisone cream BID until resolution - no signs of infection - discussed return precautions    Meds ordered this encounter  Medications  . nystatin (MYCOSTATIN/NYSTOP) powder    Sig: Apply topically 3 (three) times daily.    Dispense:  30 g    Refill:  1  . clotrimazole-betamethasone (LOTRISONE) cream    Sig: Apply 1 application  topically 2 (two) times daily.    Dispense:  30 g    Refill:  1     Return if symptoms worsen or fail to improve.   The entirety of the information documented in the History of Present Illness, Review of Systems and Physical Exam were personally obtained by me. Portions of this information were initially documented by Ival Bible, CMA and reviewed by me for thoroughness and accuracy.    Virginia Crews, MD, MPH [facility] 08/30/2018 3:29 PM

## 2018-08-30 NOTE — Patient Instructions (Signed)
Dehydration, Adult Dehydration is a condition in which there is not enough fluid or water in the body. This happens when you lose more fluids than you take in. Important organs, such as the kidneys, brain, and heart, cannot function without a proper amount of fluids. Any loss of fluids from the body can lead to dehydration. Dehydration can range from mild to severe. This condition should be treated right away to prevent it from becoming severe. What are the causes? This condition may be caused by:  Vomiting.  Diarrhea.  Excessive sweating, such as from heat exposure or exercise.  Not drinking enough fluid, especially: ? When ill. ? While doing activity that requires a lot of energy.  Excessive urination.  Fever.  Infection.  Certain medicines, such as medicines that cause the body to lose excess fluid (diuretics).  Inability to access safe drinking water.  Reduced physical ability to get adequate water and food.  What increases the risk? This condition is more likely to develop in people:  Who have a poorly controlled long-term (chronic) illness, such as diabetes, heart disease, or kidney disease.  Who are age 65 or older.  Who are disabled.  Who live in a place with high altitude.  Who play endurance sports.  What are the signs or symptoms? Symptoms of mild dehydration may include:  Thirst.  Dry lips.  Slightly dry mouth.  Dry, warm skin.  Dizziness. Symptoms of moderate dehydration may include:  Very dry mouth.  Muscle cramps.  Dark urine. Urine may be the color of tea.  Decreased urine production.  Decreased tear production.  Heartbeat that is irregular or faster than normal (palpitations).  Headache.  Light-headedness, especially when you stand up from a sitting position.  Fainting (syncope). Symptoms of severe dehydration may include:  Changes in skin, such as: ? Cold and clammy skin. ? Blotchy (mottled) or pale skin. ? Skin that does  not quickly return to normal after being lightly pinched and released (poor skin turgor).  Changes in body fluids, such as: ? Extreme thirst. ? No tear production. ? Inability to sweat when body temperature is high, such as in hot weather. ? Very little urine production.  Changes in vital signs, such as: ? Weak pulse. ? Pulse that is more than 100 beats a minute when sitting still. ? Rapid breathing. ? Low blood pressure.  Other changes, such as: ? Sunken eyes. ? Cold hands and feet. ? Confusion. ? Lack of energy (lethargy). ? Difficulty waking up from sleep. ? Short-term weight loss. ? Unconsciousness. How is this diagnosed? This condition is diagnosed based on your symptoms and a physical exam. Blood and urine tests may be done to help confirm the diagnosis. How is this treated? Treatment for this condition depends on the severity. Mild or moderate dehydration can often be treated at home. Treatment should be started right away. Do not wait until dehydration becomes severe. Severe dehydration is an emergency and it needs to be treated in a hospital. Treatment for mild dehydration may include:  Drinking more fluids.  Replacing salts and minerals in your blood (electrolytes) that you may have lost. Treatment for moderate dehydration may include:  Drinking an oral rehydration solution (ORS). This is a drink that helps you replace fluids and electrolytes (rehydrate). It can be found at pharmacies and retail stores. Treatment for severe dehydration may include:  Receiving fluids through an IV tube.  Receiving an electrolyte solution through a feeding tube that is passed through your nose   and into your stomach (nasogastric tube, or NG tube).  Correcting any abnormalities in electrolytes.  Treating the underlying cause of dehydration. Follow these instructions at home:  If directed by your health care provider, drink an ORS: ? Make an ORS by following instructions on the  package. ? Start by drinking small amounts, about  cup (120 mL) every 5-10 minutes. ? Slowly increase how much you drink until you have taken the amount recommended by your health care provider.  Drink enough clear fluid to keep your urine clear or pale yellow. If you were told to drink an ORS, finish the ORS first, then start slowly drinking other clear fluids. Drink fluids such as: ? Water. Do not drink only water. Doing that can lead to having too little salt (sodium) in the body (hyponatremia). ? Ice chips. ? Fruit juice that you have added water to (diluted fruit juice). ? Low-calorie sports drinks.  Avoid: ? Alcohol. ? Drinks that contain a lot of sugar. These include high-calorie sports drinks, fruit juice that is not diluted, and soda. ? Caffeine. ? Foods that are greasy or contain a lot of fat or sugar.  Take over-the-counter and prescription medicines only as told by your health care provider.  Do not take sodium tablets. This can lead to having too much sodium in the body (hypernatremia).  Eat foods that contain a healthy balance of electrolytes, such as bananas, oranges, potatoes, tomatoes, and spinach.  Keep all follow-up visits as told by your health care provider. This is important. Contact a health care provider if:  You have abdominal pain that: ? Gets worse. ? Stays in one area (localizes).  You have a rash.  You have a stiff neck.  You are more irritable than usual.  You are sleepier or more difficult to wake up than usual.  You feel weak or dizzy.  You feel very thirsty.  You have urinated only a small amount of very dark urine over 6-8 hours. Get help right away if:  You have symptoms of severe dehydration.  You cannot drink fluids without vomiting.  Your symptoms get worse with treatment.  You have a fever.  You have a severe headache.  You have vomiting or diarrhea that: ? Gets worse. ? Does not go away.  You have blood or green matter  (bile) in your vomit.  You have blood in your stool. This may cause stool to look black and tarry.  You have not urinated in 6-8 hours.  You faint.  Your heart rate while sitting still is over 100 beats a minute.  You have trouble breathing. This information is not intended to replace advice given to you by your health care provider. Make sure you discuss any questions you have with your health care provider. Document Released: 09/06/2005 Document Revised: 04/02/2016 Document Reviewed: 10/31/2015 Elsevier Interactive Patient Education  2018 [employer]  

## 2018-09-27 ENCOUNTER — Ambulatory Visit: Payer: Self-pay | Admitting: Physician Assistant

## 2018-10-10 DIAGNOSIS — M542 Cervicalgia: Secondary | ICD-10-CM | POA: Diagnosis not present

## 2018-12-30 ENCOUNTER — Other Ambulatory Visit: Payer: Self-pay | Admitting: Physician Assistant

## 2018-12-30 DIAGNOSIS — M5413 Radiculopathy, cervicothoracic region: Secondary | ICD-10-CM

## 2018-12-30 DIAGNOSIS — Q0700 Arnold-Chiari syndrome without spina bifida or hydrocephalus: Secondary | ICD-10-CM

## 2019-01-01 DIAGNOSIS — F411 Generalized anxiety disorder: Secondary | ICD-10-CM | POA: Diagnosis not present

## 2019-01-09 DIAGNOSIS — F411 Generalized anxiety disorder: Secondary | ICD-10-CM | POA: Diagnosis not present

## 2019-01-23 DIAGNOSIS — F411 Generalized anxiety disorder: Secondary | ICD-10-CM | POA: Diagnosis not present

## 2019-01-29 DIAGNOSIS — F411 Generalized anxiety disorder: Secondary | ICD-10-CM | POA: Diagnosis not present

## 2019-02-13 DIAGNOSIS — F411 Generalized anxiety disorder: Secondary | ICD-10-CM | POA: Diagnosis not present

## 2019-02-27 DIAGNOSIS — F411 Generalized anxiety disorder: Secondary | ICD-10-CM | POA: Diagnosis not present

## 2019-03-12 ENCOUNTER — Other Ambulatory Visit: Payer: Self-pay | Admitting: Physician Assistant

## 2019-03-12 ENCOUNTER — Other Ambulatory Visit: Payer: Self-pay | Admitting: Family Medicine

## 2019-03-12 DIAGNOSIS — F331 Major depressive disorder, recurrent, moderate: Secondary | ICD-10-CM

## 2019-03-15 NOTE — Progress Notes (Signed)
Patient: Virginia Woods Female    DOB: [birth year]   48 y.o.   MRN: [id number] Visit Date: 03/19/2019  Today's Provider: Mar Daring, PA-C   Chief Complaint  Patient presents with  . Anxiety   Subjective:     HPI Patient C/O "racing heartbeats", stress and anxiety the past 2 weeks.  She reports that two weeks ago she was feeling anxious and could feel her heart racing and felt palpitations. She denies chest tightness, chest pain or SOB. She went and got on her treadmill and was walking at 1.69mph. Her HR would jump between 166-176bpm while holding the HR monitor on the treadmill. Since she reports having these episodes intermittently over the last week. When they occur she never has other symptoms except anxiousness and palpitations.   Allergies  Allergen Reactions  . Doxycycline Itching and Rash  . Tamiflu [Oseltamivir Phosphate] Rash     Current Outpatient Medications:  .  albuterol (PROVENTIL HFA;VENTOLIN HFA) 108 (90 Base) MCG/ACT inhaler, Inhale 2 puffs into the lungs every 6 (six) hours as needed for wheezing or shortness of breath., Disp: 1 Inhaler, Rfl: 2 .  ALPRAZolam (XANAX) 0.5 MG tablet, Take 1 tablet (0.5 mg total) by mouth 2 (two) times daily as needed., Disp: 180 tablet, Rfl: 1 .  Ascorbic Acid (VITAMIN C PO), Take by mouth., Disp: , Rfl:  .  chlorpheniramine-HYDROcodone (TUSSIONEX PENNKINETIC ER) 10-8 MG/5ML SUER, Take 5 mLs by mouth every 12 (twelve) hours as needed., Disp: 60 mL, Rfl: 0 .  citalopram (CELEXA) 40 MG tablet, TAKE 1 TABLET BY MOUTH EVERY DAY, Disp: 90 tablet, Rfl: 1 .  clotrimazole-betamethasone (LOTRISONE) cream, Apply 1 application topically 2 (two) times daily., Disp: 30 g, Rfl: 1 .  Cyanocobalamin (VITAMIN B 12 PO), Take by mouth., Disp: , Rfl:  .  cyclobenzaprine (FLEXERIL) 10 MG tablet, Take 1 tablet (10 mg total) by mouth 3 (three) times daily as needed for muscle spasms., Disp: 270 tablet, Rfl: 1 .  diphenhydrAMINE (BENADRYL)  25 MG tablet, Take 25 mg by mouth at bedtime as needed for sleep., Disp: , Rfl:  .  gabapentin (NEURONTIN) 300 MG capsule, TAKE 1 CAPSULE BY MOUTH TWICE A DAY, Disp: 180 capsule, Rfl: 1 .  loratadine (CLARITIN) 10 MG tablet, Take 10 mg by mouth daily as needed. , Disp: , Rfl:  .  Multiple Vitamins-Minerals (ZINC PO), Take by mouth., Disp: , Rfl:  .  nystatin (MYCOSTATIN/NYSTOP) powder, APPLY TO AFFECTED AREA 3 TIMES A DAY, Disp: 30 g, Rfl: 1 .  OMEGA-3 FATTY ACIDS PO, , Disp: , Rfl:  .  traZODone (DESYREL) 50 MG tablet, TAKE HALF TO ONE TABLET BY MOUTH AT BEDTIME AS NEEDED FOR SLEEP., Disp: 90 tablet, Rfl: 2 .  valACYclovir (VALTREX) 500 MG tablet, Take 1 tablet (500 mg total) by mouth 2 (two) times daily., Disp: 14 tablet, Rfl: 3 .  Vitamin D, Ergocalciferol, (DRISDOL) 50000 units CAPS capsule, TAKE ONE CAPSULE BY MOUTH VERY 7 DAYS, Disp: 12 capsule, Rfl: 1  Review of Systems  Constitutional: Negative.   HENT: Negative.   Respiratory: Negative.  Negative for cough, chest tightness and shortness of breath.   Cardiovascular: Positive for palpitations.  Gastrointestinal: Negative.   Musculoskeletal: Negative.   Neurological: Negative.   Psychiatric/Behavioral: The patient is nervous/anxious.     Social History   Tobacco Use  . Smoking status: Never Smoker  . Smokeless tobacco: Never Used  Substance Use Topics  . Alcohol  use: Yes    Comment: 1-2 glasses wine per month      Objective:   BP 123/76 (BP Location: Right Arm, Patient Position: Sitting, Cuff Size: Large)   Pulse 93   Temp 98.7 F (37.1 C) (Oral)   Wt 211 lb 9.6 oz (96 kg)   SpO2 99%   BMI 35.21 kg/m  Vitals:   03/16/19 1412  BP: 123/76  Pulse: 93  Temp: 98.7 F (37.1 C)  TempSrc: Oral  SpO2: 99%  Weight: 211 lb 9.6 oz (96 kg)     Physical Exam Vitals signs reviewed.  Constitutional:      General: She is not in acute distress.    Appearance: Normal appearance. She is well-developed. She is not  ill-appearing or diaphoretic.  Neck:     Musculoskeletal: Normal range of motion and neck supple.     Thyroid: No thyromegaly.     Vascular: No JVD.     Trachea: No tracheal deviation.  Cardiovascular:     Rate and Rhythm: Normal rate. Rhythm irregular.     Pulses: Normal pulses.     Heart sounds: Normal heart sounds. No murmur. No friction rub. No gallop.      Comments: Auscultating heart sounds appear to have dropped beats Pulmonary:     Effort: Pulmonary effort is normal. No respiratory distress.     Breath sounds: Normal breath sounds. No wheezing or rales.  Musculoskeletal:     Right lower leg: No edema.     Left lower leg: No edema.  Lymphadenopathy:     Cervical: No cervical adenopathy.  Neurological:     Mental Status: She is alert.      Results for orders placed or performed in visit on 03/16/19  CBC w/Diff/Platelet  Result Value Ref Range   WBC 4.7 3.4 - 10.8 x10E3/uL   RBC 5.22 3.77 - 5.28 x10E6/uL   Hemoglobin 12.4 11.1 - 15.9 g/dL   Hematocrit 39.6 34.0 - 46.6 %   MCV 76 (L) 79 - 97 fL   MCH 23.8 (L) 26.6 - 33.0 pg   MCHC 31.3 (L) 31.5 - 35.7 g/dL   RDW 14.0 11.7 - 15.4 %   Platelets 316 150 - 450 x10E3/uL   Neutrophils 51 Not Estab. %   Lymphs 39 Not Estab. %   Monocytes 8 Not Estab. %   Eos 1 Not Estab. %   Basos 1 Not Estab. %   Neutrophils Absolute 2.4 1.4 - 7.0 x10E3/uL   Lymphocytes Absolute 1.8 0.7 - 3.1 x10E3/uL   Monocytes Absolute 0.4 0.1 - 0.9 x10E3/uL   EOS (ABSOLUTE) 0.1 0.0 - 0.4 x10E3/uL   Basophils Absolute 0.0 0.0 - 0.2 x10E3/uL   Immature Granulocytes 0 Not Estab. %   Immature Grans (Abs) 0.0 0.0 - 0.1 x10E3/uL  Comprehensive Metabolic Panel (CMET)  Result Value Ref Range   Glucose 88 65 - 99 mg/dL   BUN 10 6 - 24 mg/dL   Creatinine, Ser 0.76 0.57 - 1.00 mg/dL   GFR calc non Af Amer 93 >59 mL/min/1.73   GFR calc Af Amer 107 >59 mL/min/1.73   BUN/Creatinine Ratio 13 9 - 23   Sodium 143 134 - 144 mmol/L   Potassium 4.2 3.5 - 5.2  mmol/L   Chloride 103 96 - 106 mmol/L   CO2 23 20 - 29 mmol/L   Calcium 9.3 8.7 - 10.2 mg/dL   Total Protein 6.8 6.0 - 8.5 g/dL   Albumin 4.4 3.8 - 4.8  g/dL   Globulin, Total 2.4 1.5 - 4.5 g/dL   Albumin/Globulin Ratio 1.8 1.2 - 2.2   Bilirubin Total <0.2 0.0 - 1.2 mg/dL   Alkaline Phosphatase 66 39 - 117 IU/L   AST 19 0 - 40 IU/L   ALT 13 0 - 32 IU/L  TSH  Result Value Ref Range   TSH 1.190 0.450 - 4.500 uIU/mL  Fe+TIBC+Fer  Result Value Ref Range   Total Iron Binding Capacity 392 250 - 450 ug/dL   UIBC 365 131 - 425 ug/dL   Iron 27 27 - 159 ug/dL   Iron Saturation 7 (LL) 15 - 55 %   Ferritin 23 15 - 150 ng/mL  Vitamin D (25 hydroxy)  Result Value Ref Range   Vit D, 25-Hydroxy 30.4 30.0 - 100.0 ng/mL       Assessment & Plan    1. Iron deficiency anemia secondary to inadequate dietary iron intake Will check labs as below and f/u pending results.  - CBC w/Diff/Platelet - Comprehensive Metabolic Panel (CMET) - Fe+TIBC+Fer  2. Vitamin D deficiency Will check labs as below and f/u pending results. - Vitamin D (25 hydroxy)  3. Palpitations EKG showed Second degree heart block type 2 but rate of 67. It appears to be dropping every third beat. Referral made to cardiology. Will check labs as below and f/u pending results. - Comprehensive Metabolic Panel (CMET) - TSH - EKG 12-Lead  4. Anxiety See above medical treatment plan.  5. Mobitz type 2 second degree heart block See above medical treatment plan for #3.  - EKG 12-Lead - Ambulatory referral to Cardiology     Mar Daring, PA-C  [facility]

## 2019-03-16 ENCOUNTER — Ambulatory Visit: Payer: Federal, State, Local not specified - PPO | Admitting: Physician Assistant

## 2019-03-16 ENCOUNTER — Encounter: Payer: Self-pay | Admitting: Physician Assistant

## 2019-03-16 ENCOUNTER — Other Ambulatory Visit: Payer: Self-pay

## 2019-03-16 VITALS — BP 123/76 | HR 93 | Temp 98.7°F | Wt 211.6 lb

## 2019-03-16 DIAGNOSIS — D508 Other iron deficiency anemias: Secondary | ICD-10-CM

## 2019-03-16 DIAGNOSIS — F419 Anxiety disorder, unspecified: Secondary | ICD-10-CM

## 2019-03-16 DIAGNOSIS — I441 Atrioventricular block, second degree: Secondary | ICD-10-CM

## 2019-03-16 DIAGNOSIS — E559 Vitamin D deficiency, unspecified: Secondary | ICD-10-CM

## 2019-03-16 DIAGNOSIS — R002 Palpitations: Secondary | ICD-10-CM

## 2019-03-16 NOTE — Patient Instructions (Addendum)
Second-Degree Atrioventricular Block Second-degree atrioventricular (AV) block is a condition that can cause the heart to miss beats. In this condition, the electrical signals that travel from the heart's upper chambers (atria) to its lower chambers (ventricles) move too slowly or are interrupted. There are two types of second-degree AV block:  Mobitz type 1 block. This does not cause symptoms. It rarely requires treatment.  Mobitz type 2 block. This is more serious. It results in more missed beats and a very irregular heartbeat. It can lead to complete heart block, meaning that no signal exists between the upper and lower chambers of the heart. This is a dangerous condition that can lead to fainting or cardiac arrest. What are the causes? This condition may be caused by:  Any condition that damages the system that controls the heart's rate and rhythm, such as a heart attack or infection of the heart.  Overstimulation of the nerve that slows down heart rate (vagus nerve). This cause is common among well-conditioned athletes.  Some medicinesthat slow down the heart rate, such as beta blockers or calcium channel blockers.  Surgery that damages the heart. Some people are born with this condition (congenital heart block), but most people develop it over time. What increases the risk? The risk for this condition increases with age.You are also more likely to develop this condition if you have:  A history of heart attack.  Heart failure.  Coronary heart disease.  Inflammation of heart muscle (myocarditis).  Disease of heart muscle (cardiomyopathy).  Infection of the heart valves (endocarditis).  Infections or diseases that affect the heart. These include: ? Lyme disease. ? Sarcoidosis. ? Hemochromatosis. ? Rheumatic fever. ? Certain muscle disorders. Babies are more likely to be born with heart block if:  The child's mother has an autoimmune disease, such as lupus.  The baby  is born with a heart defect that affects the heart's structure.  A parent was born with a heart defect. What are the signs or symptoms? Symptoms of this condition include:  Tiredness.  Shortness of breath.  Dizziness.  Lightheadedness.  Fainting.  Chest pain. How is this diagnosed? This condition may be diagnosed based on:  A physical exam.  Your medical history.  A measurement of your pulse or heartbeat.  Tests. These may include: ? An electrocardiogram (ECG). This checks for problems with electrical activity in your heart. ? An ambulatory cardiac monitor or event monitor test. This portable device monitors your heart rate over time. ? An electrophysiology (EP) study. Long, thin tubes (catheters) are placed in your heart. The catheters give information about your heart's electrical signals. How is this treated? Treatment for this condition depends on the type of block you have and how severe it is. Treatment may involve:  Treating an underlying condition, such as heart disease.  Changing or stopping any heart medicines that may have caused heart block.  Having a permanent pacemaker placed in your chest. A pacemaker uses electrical pulses to help the heart beat normally. It is usually placed under the skin on your chest or abdomen. You may need a pacemaker if you have Mobitz type 2 block. Follow these instructions at home: General instructions      Take over-the-counter and prescription medicines only as told by your health care provider.  Follow your health care provider's recommendations to help reduce your risk for heart disease. These may include: ? Exercising for at least 30 minutes 5 or more days (150 minutes) each week. Ask your  health care provider what type of exercise is safe for you. ? Eating a heart-healthy diet with fruits and vegetables, whole grains, low-fat dairy products, and lean proteins like poultry and eggs. Your health care provider or dietitian  can help you make healthy choices. ? Maintaining a healthy weight.  Do not use any products that contain nicotine or tobacco, such as cigarettes and e-cigarettes. If you need help quitting, ask your health care provider.  Keep all follow-up visits as told by your health care provider. This is important. Alcohol use  Do not drink alcohol if: ? Your health care provider tells you not to drink. ? You are pregnant, may be pregnant, or are planning to become pregnant.  If you drink alcohol, limit how much you have: ? 0-1 drink a day for women. ? 0-2 drinks a day for men.  Be aware of how much alcohol is in your drink. In the [place] one drink equals one typical bottle of beer (12 oz), one-half glass of wine (5 oz), or one shot of hard liquor (1 oz). Contact a health care provider if you:  Feel like your heart is skipping beats.  Feel more tired than normal.  Have swelling in your lower legs or your feet. Get help right away if you:  Have symptoms that change or get worse.  Develop new symptoms.  Have chest pain, especially if the pain: ? Feels like crushing or pressure. ? Spreads to your arms, back, neck, or jaw.  Feel short of breath.  Feel light-headed or weak.  Faint. These symptoms may represent a serious problem that is an emergency. Do not wait to see if the symptoms will go away. Get medical help right away. Call your local emergency services (911 in the [place] Do not drive yourself to the hospital. Summary  Second-degree AV block is a type of heart block that can cause the heart to miss beats. In this condition, the electrical signals that control heart rate move too slowly or are interrupted.  You may need a pacemaker if you have the more serious type of AV block (Mobitz type 2 block).  Get help right away if you have chest pain, if you faint, or if you develop new symptoms. This information is not intended to replace advice given to you by your health care provider.  Make sure you discuss any questions you have with your health care provider. Document Released: 08/19/2008 Document Revised: 10/18/2017 Document Reviewed: 10/18/2017 Elsevier Patient Education  2020 [employer].

## 2019-03-17 LAB — COMPREHENSIVE METABOLIC PANEL
ALT: 13 IU/L (ref 0–32)
AST: 19 IU/L (ref 0–40)
Albumin/Globulin Ratio: 1.8 (ref 1.2–2.2)
Albumin: 4.4 g/dL (ref 3.8–4.8)
Alkaline Phosphatase: 66 IU/L (ref 39–117)
BUN/Creatinine Ratio: 13 (ref 9–23)
BUN: 10 mg/dL (ref 6–24)
Bilirubin Total: <0.2 mg/dL (ref 0.0–1.2)
CO2: 23 mmol/L (ref 20–29)
Calcium: 9.3 mg/dL (ref 8.7–10.2)
Chloride: 103 mmol/L (ref 96–106)
Creatinine, Ser: 0.76 mg/dL (ref 0.57–1.00)
GFR calc Af Amer: 107 mL/min/{1.73_m2} (ref >59)
GFR calc non Af Amer: 93 mL/min/{1.73_m2} (ref >59)
Globulin, Total: 2.4 g/dL (ref 1.5–4.5)
Glucose: 88 mg/dL (ref 65–99)
Potassium: 4.2 mmol/L (ref 3.5–5.2)
Sodium: 143 mmol/L (ref 134–144)
Total Protein: 6.8 g/dL (ref 6.0–8.5)

## 2019-03-17 LAB — CBC WITH DIFFERENTIAL/PLATELET
Basophils Absolute: 0 10*3/uL (ref 0.0–0.2)
Basos: 1 %
EOS (ABSOLUTE): 0.1 10*3/uL (ref 0.0–0.4)
Eos: 1 %
Hematocrit: 39.6 % (ref 34.0–46.6)
Hemoglobin: 12.4 g/dL (ref 11.1–15.9)
Immature Grans (Abs): 0 10*3/uL (ref 0.0–0.1)
Immature Granulocytes: 0 %
Lymphocytes Absolute: 1.8 10*3/uL (ref 0.7–3.1)
Lymphs: 39 %
MCH: 23.8 pg — ABNORMAL LOW (ref 26.6–33.0)
MCHC: 31.3 g/dL — ABNORMAL LOW (ref 31.5–35.7)
MCV: 76 fL — ABNORMAL LOW (ref 79–97)
Monocytes Absolute: 0.4 10*3/uL (ref 0.1–0.9)
Monocytes: 8 %
Neutrophils Absolute: 2.4 10*3/uL (ref 1.4–7.0)
Neutrophils: 51 %
Platelets: 316 10*3/uL (ref 150–450)
RBC: 5.22 x10E6/uL (ref 3.77–5.28)
RDW: 14 % (ref 11.7–15.4)
WBC: 4.7 10*3/uL (ref 3.4–10.8)

## 2019-03-17 LAB — VITAMIN D 25 HYDROXY (VIT D DEFICIENCY, FRACTURES): Vit D, 25-Hydroxy: 30.4 ng/mL (ref 30.0–100.0)

## 2019-03-17 LAB — IRON,TIBC AND FERRITIN PANEL
Ferritin: 23 ng/mL (ref 15–150)
Iron Saturation: 7 % — CL (ref 15–55)
Iron: 27 ug/dL (ref 27–159)
Total Iron Binding Capacity: 392 ug/dL (ref 250–450)
UIBC: 365 ug/dL (ref 131–425)

## 2019-03-17 LAB — TSH: TSH: 1.19 u[IU]/mL (ref 0.450–4.500)

## 2019-03-19 DIAGNOSIS — I441 Atrioventricular block, second degree: Secondary | ICD-10-CM | POA: Insufficient documentation

## 2019-03-20 DIAGNOSIS — F411 Generalized anxiety disorder: Secondary | ICD-10-CM | POA: Diagnosis not present

## 2019-03-20 DIAGNOSIS — K080 Exfoliation of teeth due to systemic causes: Secondary | ICD-10-CM | POA: Diagnosis not present

## 2019-04-09 ENCOUNTER — Telehealth: Payer: Self-pay | Admitting: Cardiovascular Disease

## 2019-04-09 NOTE — Telephone Encounter (Signed)
New Message          COVID-19 Pre-Screening Questions:   In the past 7 to 10 days have you had a cough,  shortness of breath, headache, congestion, fever (100 or greater) body aches, chills, sore throat, or sudden loss of taste or sense of smell? NO  Have you been around anyone with known Covid 19.  NO  Have you been around anyone who is awaiting Covid 19 test results in the past 7 to 10 days? NO  Have you been around anyone who has been exposed to Covid 19, or has mentioned symptoms of Covid 19 within the past 7 to 10 days? YES, Pt says the person had symptoms and was tested and was NEG   If you have any concerns/questions about symptoms patients report during screening (either on the phone or at [facility]). Contact the provider seeing the patient or DOD for further guidance.  If neither are available contact a member of the leadership team.

## 2019-04-10 ENCOUNTER — Other Ambulatory Visit: Payer: Self-pay

## 2019-04-10 ENCOUNTER — Encounter: Payer: Self-pay | Admitting: Cardiovascular Disease

## 2019-04-10 ENCOUNTER — Ambulatory Visit (INDEPENDENT_AMBULATORY_CARE_PROVIDER_SITE_OTHER): Payer: Federal, State, Local not specified - PPO | Admitting: Cardiovascular Disease

## 2019-04-10 VITALS — BP 120/78 | HR 90 | Ht 65.0 in | Wt 213.8 lb

## 2019-04-10 DIAGNOSIS — I471 Supraventricular tachycardia, unspecified: Secondary | ICD-10-CM | POA: Insufficient documentation

## 2019-04-10 LAB — BASIC METABOLIC PANEL
BUN/Creatinine Ratio: 13 (ref 9–23)
BUN: 10 mg/dL (ref 6–24)
CO2: 22 mmol/L (ref 20–29)
Calcium: 9.1 mg/dL (ref 8.7–10.2)
Chloride: 102 mmol/L (ref 96–106)
Creatinine, Ser: 0.77 mg/dL (ref 0.57–1.00)
GFR calc Af Amer: 105 mL/min/{1.73_m2} (ref >59)
GFR calc non Af Amer: 91 mL/min/{1.73_m2} (ref >59)
Glucose: 92 mg/dL (ref 65–99)
Potassium: 4.2 mmol/L (ref 3.5–5.2)
Sodium: 141 mmol/L (ref 134–144)

## 2019-04-10 MED ORDER — PROPRANOLOL HCL 10 MG PO TABS: 10.0000 mg | ORAL_TABLET | Freq: Four times a day (QID) | ORAL | 11 refills | Status: DC | PRN

## 2019-04-10 NOTE — Patient Instructions (Signed)
Medication Instructions:  Your physician has recommended you make the following change in your medication: START Propranolol (Inderal) 10 mg up to 4 times per day as needed for fast heart rate and/or palpitations  If you need a refill on your cardiac medications before your next appointment, please call your pharmacy.   Lab work: TODAY - basic metabolic panel  If you have labs (blood work) drawn today and your tests are completely normal, you will receive your results only by: Marland Kitchen MyChart Message (if you have MyChart) OR . A paper copy in the mail If you have any lab test that is abnormal or we need to change your treatment, we will call you to review the results.   Testing/Procedures: None Ordered   Follow-Up: At [facility], you and your health needs are our priority.  As part of our continuing mission to provide you with exceptional heart care, we have created designated Provider Care Teams.  These Care Teams include your primary Cardiologist (physician) and Advanced Practice Providers (APPs -  Physician Assistants and Nurse Practitioners) who all work together to provide you with the care you need, when you need it. You will need a follow up appointment in:  3 months with Mertie Moores, MD on Wednesday Oct. 28. In the future you may see one of the following Advanced Practice Providers on your designated Care Team: Richardson Dopp, PA-C [place], [place] . Daune Perch, NP  YOUR CARDIOLOGY TEAM HAS ARRANGED FOR AN E-VISIT FOR YOUR APPOINTMENT - PLEASE REVIEW IMPORTANT INFORMATION BELOW SEVERAL DAYS PRIOR TO YOUR APPOINTMENT  Due to the recent COVID-19 pandemic, we are transitioning in-person office visits to tele-medicine visits in an effort to decrease unnecessary exposure to our patients, their families, and staff. These visits are billed to your insurance just like a normal visit is. We also encourage you to sign up for MyChart if you have not already done so. You will need a  smartphone if possible. For patients that do not have this, we can still complete the visit using a regular telephone but do prefer a smartphone to enable video when possible. You may have a family member that lives with you that can help. If possible, we also ask that you have a blood pressure cuff and scale at home to measure your blood pressure, heart rate and weight prior to your scheduled appointment. Patients with clinical needs that need an in-person evaluation and testing will still be able to come to the office if absolutely necessary. If you have any questions, feel free to call our office.     YOUR PROVIDER WILL BE USING THE FOLLOWING PLATFORM TO COMPLETE YOUR VISIT: Doxy.Me   . IF USING DOXIMITY or [email or web address] - The staff will give you instructions on receiving your link to join the meeting the day of your visit.    2-3 DAYS BEFORE YOUR APPOINTMENT  You will receive a telephone call from one of our [facility] team members - your caller ID may say "Unknown caller." If this is a video visit, we will walk you through how to get the video launched on your phone. We will remind you check your blood pressure, heart rate and weight prior to your scheduled appointment. If you have an Apple Watch or Kardia, please upload any pertinent ECG strips the day before or morning of your appointment to [place] [facility]. Our staff will also make sure you have reviewed the consent and agree to move forward with your scheduled tele-health visit.  THE DAY OF YOUR APPOINTMENT  Approximately 15 minutes prior to your scheduled appointment, you will receive a telephone call from one of [facility] team - your caller ID may say "Unknown caller."  Our staff will confirm medications, vital signs for the day and any symptoms you may be experiencing. Please have this information available prior to the time of visit start. It may also be helpful for you to have a pad of paper and pen handy for any instructions given during your  visit. They will also walk you through joining the smartphone meeting if this is a video visit.    CONSENT FOR TELE-HEALTH VISIT - PLEASE REVIEW  I hereby voluntarily request, consent and authorize [facility] and its employed or contracted physicians, physician assistants, nurse practitioners or other licensed health care professionals (the Practitioner), to provide me with telemedicine health care services (the "Services") as deemed necessary by the treating Practitioner. I acknowledge and consent to receive the Services by the Practitioner via telemedicine. I understand that the telemedicine visit will involve communicating with the Practitioner through live audiovisual communication technology and the disclosure of certain medical information by electronic transmission. I acknowledge that I have been given the opportunity to request an in-person assessment or other available alternative prior to the telemedicine visit and am voluntarily participating in the telemedicine visit.  I understand that I have the right to withhold or withdraw my consent to the use of telemedicine in the course of my care at any time, without affecting my right to future care or treatment, and that the Practitioner or I may terminate the telemedicine visit at any time. I understand that I have the right to inspect all information obtained and/or recorded in the course of the telemedicine visit and may receive copies of available information for a reasonable fee.  I understand that some of the potential risks of receiving the Services via telemedicine include:  Marland Kitchen Delay or interruption in medical evaluation due to technological equipment failure or disruption; . Information transmitted may not be sufficient (e.g. poor resolution of images) to allow for appropriate medical decision making by the Practitioner; and/or  . In rare instances, security protocols could fail, causing a breach of personal health  information.  Furthermore, I acknowledge that it is my responsibility to provide information about my medical history, conditions and care that is complete and accurate to the best of my ability. I acknowledge that Practitioner's advice, recommendations, and/or decision may be based on factors not within their control, such as incomplete or inaccurate data provided by me or distortions of diagnostic images or specimens that may result from electronic transmissions. I understand that the practice of medicine is not an exact science and that Practitioner makes no warranties or guarantees regarding treatment outcomes. I acknowledge that I will receive a copy of this consent concurrently upon execution via email to the email address I last provided but may also request a printed copy by calling the office of [facility].    I understand that my insurance will be billed for this visit.   I have read or had this consent read to me. . I understand the contents of this consent, which adequately explains the benefits and risks of the Services being provided via telemedicine.  . I have been provided ample opportunity to ask questions regarding this consent and the Services and have had my questions answered to my satisfaction. . I give my informed consent for the services to be provided through the  use of telemedicine in my medical care  By participating in this telemedicine visit I agree to the above.

## 2019-04-10 NOTE — Progress Notes (Signed)
Cardiology Office Note:    Date:  04/10/2019   ID:  Virginia Woods, DOB 1971-07-23, MRN [id number]  PCP:  Mar Daring, PA-C  Cardiologist:  Mertie Moores, MD  Electrophysiologist:  None   Referring MD: Florian Buff*   Chief Complaint  Patient presents with   Abnormal ECG     April 10, 2019    Virginia Woods is a 48 y.o. female with a hx of anemia.    She was incidentally noted to have pauses in her ECG which was read out as Mobitz II.    We are asked to see her today by Mar Daring, PA-C for further evaluation of her palpitations and her abnormal EKG.  My reading of the ECG suggests she had a blocked PAC .  Has episodes of tachypalpitations for years.   These tachypalpitations have recently worsened - 1.5 months ago   She measured her HR in 170s on her treadmill .  She went to lie down and put ice packs up to her face and neck..  Lasted > 30 min. Associated with lightheadedness, some chest tightness.   + sweats.   Works out regularly without any CP , or tachycardia  Daughter has WPW. Virginia Woods has learned the manovers to terminate SVT)   Recently she has started working out and has noticed skipped beats.  Works as a Education officer, [employer]   Has chiari malformation in 2011.   Had to have cranial decompression.    Past Medical History:  Diagnosis Date   Anemia    Anxiety    Depression    Vitamin B12 deficiency    Vitamin D deficiency     Past Surgical History:  Procedure Laterality Date   BRAIN SURGERY     CESAREAN SECTION  1996   TUBAL LIGATION  2002    Current Medications: Current Meds  Medication Sig   albuterol (PROVENTIL HFA;VENTOLIN HFA) 108 (90 Base) MCG/ACT inhaler Inhale 2 puffs into the lungs every 6 (six) hours as needed for wheezing or shortness of breath.   ALPRAZolam (XANAX) 0.5 MG tablet Take 1 tablet (0.5 mg total) by mouth 2 (two) times daily as needed.   Ascorbic Acid (VITAMIN C PO) Take by  mouth.   citalopram (CELEXA) 40 MG tablet TAKE 1 TABLET BY MOUTH EVERY DAY   Cyanocobalamin (VITAMIN B 12 PO) Take by mouth.   cyclobenzaprine (FLEXERIL) 10 MG tablet Take 1 tablet (10 mg total) by mouth 3 (three) times daily as needed for muscle spasms.   diphenhydrAMINE (BENADRYL) 25 MG tablet Take 25 mg by mouth at bedtime as needed for sleep.   gabapentin (NEURONTIN) 300 MG capsule TAKE 1 CAPSULE BY MOUTH TWICE A DAY   loratadine (CLARITIN) 10 MG tablet Take 10 mg by mouth daily as needed.    nystatin (MYCOSTATIN/NYSTOP) powder APPLY TO AFFECTED AREA 3 TIMES A DAY   OMEGA-3 FATTY ACIDS PO    traZODone (DESYREL) 50 MG tablet TAKE HALF TO ONE TABLET BY MOUTH AT BEDTIME AS NEEDED FOR SLEEP.   valACYclovir (VALTREX) 500 MG tablet Take 1 tablet (500 mg total) by mouth 2 (two) times daily.   Vitamin D, Ergocalciferol, (DRISDOL) 50000 units CAPS capsule TAKE ONE CAPSULE BY MOUTH VERY 7 DAYS     Allergies:   Doxycycline and Tamiflu [oseltamivir phosphate]   Social History   Socioeconomic History   Marital status: Married    Spouse name: Not on file   Number of children:  Not on file   Years of education: Not on file   Highest education level: Not on file  Occupational History   Not on file  Social Needs   Financial resource strain: Not on file   Food insecurity    Worry: Not on file    Inability: Not on file   Transportation needs    Medical: Not on file    Non-medical: Not on file  Tobacco Use   Smoking status: Never Smoker   Smokeless tobacco: Never Used  Substance and Sexual Activity   Alcohol use: Yes    Comment: 1-2 glasses wine per month   Drug use: No   Sexual activity: Not on file  Lifestyle   Physical activity    Days per week: Not on file    Minutes per session: Not on file   Stress: Not on file  Relationships   Social connections    Talks on phone: Not on file    Gets together: Not on file    Attends religious service: Not on file     Active member of club or organization: Not on file    Attends meetings of clubs or organizations: Not on file    Relationship status: Not on file  Other Topics Concern   Not on file  Social History Narrative   Not on file     Family History: The patient's family history includes Cancer in her mother; Heart disease in her maternal grandmother; Hypertension in her mother and sister.  ROS:   Please see the history of present illness.     All other systems reviewed and are negative.  EKGs/Labs/Other Studies Reviewed:    The following studies were reviewed today:  EKG:   NSR ,   PACs.   Blocked PAC  Recent Labs: 03/16/2019: ALT 13; Hemoglobin 12.4; Platelets 316; TSH 1.190 04/10/2019: BUN 10; Creatinine, Ser 0.77; Potassium 4.2; Sodium 141  Recent Lipid Panel    Component Value Date/Time   CHOL 132 06/28/2018 1606   TRIG 91 06/28/2018 1606   HDL 51 06/28/2018 1606   CHOLHDL 2.6 06/28/2018 1606   LDLCALC 63 06/28/2018 1606    Physical Exam:    VS:  BP 120/78    Pulse 90    Ht 5\' 5"  (1.651 m)    Wt 213 lb 12.8 oz (97 kg)    SpO2 97%    BMI 35.58 kg/m     Wt Readings from Last 3 Encounters:  04/10/19 213 lb 12.8 oz (97 kg)  03/16/19 211 lb 9.6 oz (96 kg)  08/30/18 202 lb 3.2 oz (91.7 kg)     GEN:  Well nourished, well developed in no acute distress HEENT: Normal NECK: No JVD; No carotid bruits LYMPHATICS: No lymphadenopathy CARDIAC: RRR, no murmurs, rubs, gallops, prominent S2  RESPIRATORY:  Clear to auscultation without rales, wheezing or rhonchi  ABDOMEN: Soft, non-tender, non-distended MUSCULOSKELETAL:  No edema; No deformity  SKIN: Warm and dry NEUROLOGIC:  Alert and oriented x 3 PSYCHIATRIC:  Normal affect   ASSESSMENT:    1. SVT (supraventricular tachycardia) (HCC)    PLAN:    In order of problems listed above:  1. Supraventricular tachycardia: Virginia Woods presents with rare episodes of SVT.  Her daughter had WPW and had an ablation.  Virginia Woods has learned the  way to terminate the tachycardia.  She had an episode about a month ago which eventually resolved after about 30 minutes.  We will draw labs today to make  sure that her potassium level is okay.  We will give her prescription for propranolol 10 mg tablets which she can take 4 times over the course of an hour or so if she has recurrent episode of SVT.  Offered to place a monitor on her but she states that she would like to get an apple watch anyway.  I will see her back in 3 months for follow-up visit.   2.  Prominent second heart sound: She does not have any signs or symptoms of pulmonary hypertension.  Blood pressures well controlled.  Will consider getting an echo at a later time.   Medication Adjustments/Labs and Tests Ordered: Current medicines are reviewed at length with the patient today.  Concerns regarding medicines are outlined above.  Orders Placed This Encounter  Procedures   Basic Metabolic Panel (BMET)   Meds ordered this encounter  Medications   propranolol (INDERAL) 10 MG tablet    Sig: Take 1 tablet (10 mg total) by mouth 4 (four) times daily as needed (fast heart rate/palpitations).    Dispense:  60 tablet    Refill:  11    Patient Instructions  Medication Instructions:  Your physician has recommended you make the following change in your medication: START Propranolol (Inderal) 10 mg up to 4 times per day as needed for fast heart rate and/or palpitations  If you need a refill on your cardiac medications before your next appointment, please call your pharmacy.   Lab work: TODAY - basic metabolic panel  If you have labs (blood work) drawn today and your tests are completely normal, you will receive your results only by:  [place] (if you have MyChart) OR  A paper copy in the mail If you have any lab test that is abnormal or we need to change your treatment, we will call you to review the results.   Testing/Procedures: None Ordered   Follow-Up: At [facility], you and your health needs are our priority.  As part of our continuing mission to provide you with exceptional heart care, we have created designated Provider Care Teams.  These Care Teams include your primary Cardiologist (physician) and Advanced Practice Providers (APPs -  Physician Assistants and Nurse Practitioners) who all work together to provide you with the care you need, when you need it. You will need a follow up appointment in:  3 months with Mertie Moores, MD on Wednesday Oct. 28. In the future you may see one of the following Advanced Practice Providers on your designated Care Team: Richardson Dopp, PA-C [place], PA-C  Daune Perch, NP  YOUR CARDIOLOGY TEAM HAS ARRANGED FOR AN E-VISIT FOR YOUR APPOINTMENT - PLEASE REVIEW IMPORTANT INFORMATION BELOW SEVERAL DAYS PRIOR TO YOUR APPOINTMENT  Due to the recent COVID-19 pandemic, we are transitioning in-person office visits to tele-medicine visits in an effort to decrease unnecessary exposure to our patients, their families, and staff. These visits are billed to your insurance just like a normal visit is. We also encourage you to sign up for MyChart if you have not already done so. You will need a smartphone if possible. For patients that do not have this, we can still complete the visit using a regular telephone but do prefer a smartphone to enable video when possible. You may have a family member that lives with you that can help. If possible, we also ask that you have a blood pressure cuff and scale at home to measure your blood pressure, heart rate and weight  prior to your scheduled appointment. Patients with clinical needs that need an in-person evaluation and testing will still be able to come to the office if absolutely necessary. If you have any questions, feel free to call our office.     YOUR PROVIDER WILL BE USING THE FOLLOWING PLATFORM TO COMPLETE YOUR VISIT: Doxy.Me    IF USING DOXIMITY or DOXY.ME - The staff will  give you instructions on receiving your link to join the meeting the day of your visit.    2-3 DAYS BEFORE YOUR APPOINTMENT  You will receive a telephone call from one of our [facility] team members - your caller ID may say "Unknown caller." If this is a video visit, we will walk you through how to get the video launched on your phone. We will remind you check your blood pressure, heart rate and weight prior to your scheduled appointment. If you have an Apple Watch or Kardia, please upload any pertinent ECG strips the day before or morning of your appointment to [facility]. Our staff will also make sure you have reviewed the consent and agree to move forward with your scheduled tele-health visit.   THE DAY OF YOUR APPOINTMENT  Approximately 15 minutes prior to your scheduled appointment, you will receive a telephone call from one of [facility] team - your caller ID may say "Unknown caller."  Our staff will confirm medications, vital signs for the day and any symptoms you may be experiencing. Please have this information available prior to the time of visit start. It may also be helpful for you to have a pad of paper and pen handy for any instructions given during your visit. They will also walk you through joining the smartphone meeting if this is a video visit.    CONSENT FOR TELE-HEALTH VISIT - PLEASE REVIEW  I hereby voluntarily request, consent and authorize [facility] and its employed or contracted physicians, physician assistants, nurse practitioners or other licensed health care professionals (the Practitioner), to provide me with telemedicine health care services (the Services") as deemed necessary by the treating Practitioner. I acknowledge and consent to receive the Services by the Practitioner via telemedicine. I understand that the telemedicine visit will involve communicating with the Practitioner through live audiovisual communication technology and the disclosure of certain medical  information by electronic transmission. I acknowledge that I have been given the opportunity to request an in-person assessment or other available alternative prior to the telemedicine visit and am voluntarily participating in the telemedicine visit.  I understand that I have the right to withhold or withdraw my consent to the use of telemedicine in the course of my care at any time, without affecting my right to future care or treatment, and that the Practitioner or I may terminate the telemedicine visit at any time. I understand that I have the right to inspect all information obtained and/or recorded in the course of the telemedicine visit and may receive copies of available information for a reasonable fee.  I understand that some of the potential risks of receiving the Services via telemedicine include:   Delay or interruption in medical evaluation due to technological equipment failure or disruption;  Information transmitted may not be sufficient (e.g. poor resolution of images) to allow for appropriate medical decision making by the Practitioner; and/or   In rare instances, security protocols could fail, causing a breach of personal health information.  Furthermore, I acknowledge that it is my responsibility to provide information about my medical history, conditions and care  that is complete and accurate to the best of my ability. I acknowledge that Practitioner's advice, recommendations, and/or decision may be based on factors not within their control, such as incomplete or inaccurate data provided by me or distortions of diagnostic images or specimens that may result from electronic transmissions. I understand that the practice of medicine is not an exact science and that Practitioner makes no warranties or guarantees regarding treatment outcomes. I acknowledge that I will receive a copy of this consent concurrently upon execution via email to the email address I last provided but may also  request a printed copy by calling the office of [facility].    I understand that my insurance will be billed for this visit.   I have read or had this consent read to me.  I understand the contents of this consent, which adequately explains the benefits and risks of the Services being provided via telemedicine.   I have been provided ample opportunity to ask questions regarding this consent and the Services and have had my questions answered to my satisfaction.  I give my informed consent for the services to be provided through the use of telemedicine in my medical care  By participating in this telemedicine visit I agree to the above.     Signed, Mertie Moores, MD  04/10/2019 5:46 PM    [facility]

## 2019-04-11 ENCOUNTER — Telehealth: Payer: Self-pay | Admitting: Cardiovascular Disease

## 2019-04-11 DIAGNOSIS — I471 Supraventricular tachycardia: Secondary | ICD-10-CM

## 2019-04-11 NOTE — Telephone Encounter (Signed)
Spoke with patient who states after going home after ov yesterday with Dr. Acie Fredrickson, she has decided she would prefer to wear a cardiac monitor. I advised that I am placing the order and that one our technicians will call her to give her instructions and place the monitor in the mail to her. I reviewed her normal lab results with her and advised her to call back with future questions or concerns. She thanked me for the call.

## 2019-04-11 NOTE — Telephone Encounter (Signed)
New Message   Patient is calling with some additional questions from her appt on yesterday. She also states that she may want to go ahead and do the monitor although she initially declined. Please call to discuss.

## 2019-04-15 NOTE — Telephone Encounter (Signed)
Agree with note from [facility], Rn.  Will order a monitor

## 2019-04-19 ENCOUNTER — Telehealth: Payer: Self-pay | Admitting: Radiology

## 2019-04-19 NOTE — Telephone Encounter (Signed)
Enrolled patient for a 30 day [employer] Event Monitor to be mailed. Brief instructions were gone over with patient and she knows to expect the monitor to arrive in 3-4 days.  

## 2019-04-30 ENCOUNTER — Telehealth: Payer: Self-pay | Admitting: Cardiovascular Disease

## 2019-04-30 NOTE — Telephone Encounter (Signed)
Spoke with patient who asks if SVT places her at high risk for having more adverse effects should she get Covid-19. I advised that this is not a high risk condition. She states she has not put the cardiac monitor on yet and I encouraged her to do so so that we can determine how often she has SVT or other arrhythmias. She verbalized understanding and agreement and thanked me for the call.

## 2019-04-30 NOTE — Telephone Encounter (Signed)
Left message for patient that her SVT does not put her at high risk for Covid infection. Advised she may call back to discuss further and provided the office phone number

## 2019-04-30 NOTE — Telephone Encounter (Signed)
Patient is a Education officer, museum, she was recently diagnosed with a heart condition. She wants to know if it safe for her to return to the classroom with her newly diagnosed heart condition.  She just received an e-mail from her job and she needs to know by 5pm today.

## 2019-05-03 ENCOUNTER — Telehealth: Payer: Self-pay | Admitting: Physician Assistant

## 2019-05-04 ENCOUNTER — Ambulatory Visit: Payer: Federal, State, Local not specified - PPO | Admitting: Physician Assistant

## 2019-05-04 ENCOUNTER — Encounter: Payer: Self-pay | Admitting: Physician Assistant

## 2019-05-04 ENCOUNTER — Other Ambulatory Visit: Payer: Self-pay

## 2019-05-04 VITALS — BP 139/81 | HR 80 | Temp 96.8°F | Resp 16 | Ht 65.0 in | Wt 214.0 lb

## 2019-05-04 DIAGNOSIS — J392 Other diseases of pharynx: Secondary | ICD-10-CM

## 2019-05-04 MED ORDER — METHYLPREDNISOLONE ACETATE 80 MG/ML IJ SUSP: 80.0000 mg | Freq: Once | INTRAMUSCULAR | Status: DC

## 2019-05-04 MED ORDER — METHYLPREDNISOLONE ACETATE 40 MG/ML IJ SUSP
40.0000 mg | Freq: Once | INTRAMUSCULAR | Status: AC
  Administered 2019-05-04: 40 mg via INTRAMUSCULAR

## 2019-05-04 MED ORDER — METHYLPREDNISOLONE ACETATE 40 MG/ML IJ SUSP: 80.0000 mg | Freq: Once | INTRAMUSCULAR | Status: DC

## 2019-05-04 NOTE — Progress Notes (Signed)
Patient: Virginia Woods Female    DOB: May 13, 1971   48 y.o.   MRN: [id number] Visit Date: 05/04/2019  Today's Provider: Mar Daring, PA-C   No chief complaint on file.  Subjective:     Sore Throat  This is a new problem. The current episode started in the past 7 days (2 days). Neither side of throat is experiencing more pain than the other. There has been no fever. Associated symptoms include headaches and trouble swallowing. Pertinent negatives include no abdominal pain, congestion, coughing, diarrhea, drooling, ear discharge, ear pain, hoarse voice, plugged ear sensation, neck pain, shortness of breath, stridor, swollen glands or vomiting.   Patient states she has had a sensation in the back of her throat for the past 2 days, that feels like a lump. Patient states she has been having difficulty swallowing. Patient states when she takes her medication, it feels like the pills are getting stuck in her throat. Patient has been drinking hot tea to help with throat but it is not helping.   Allergies  Allergen Reactions  . Doxycycline Itching and Rash  . Tamiflu [Oseltamivir Phosphate] Rash     Current Outpatient Medications:  .  albuterol (PROVENTIL HFA;VENTOLIN HFA) 108 (90 Base) MCG/ACT inhaler, Inhale 2 puffs into the lungs every 6 (six) hours as needed for wheezing or shortness of breath., Disp: 1 Inhaler, Rfl: 2 .  ALPRAZolam (XANAX) 0.5 MG tablet, Take 1 tablet (0.5 mg total) by mouth 2 (two) times daily as needed., Disp: 180 tablet, Rfl: 1 .  Ascorbic Acid (VITAMIN C PO), Take by mouth., Disp: , Rfl:  .  citalopram (CELEXA) 40 MG tablet, TAKE 1 TABLET BY MOUTH EVERY DAY, Disp: 90 tablet, Rfl: 1 .  Cyanocobalamin (VITAMIN B 12 PO), Take by mouth., Disp: , Rfl:  .  cyclobenzaprine (FLEXERIL) 10 MG tablet, Take 1 tablet (10 mg total) by mouth 3 (three) times daily as needed for muscle spasms., Disp: 270 tablet, Rfl: 1 .  diphenhydrAMINE (BENADRYL) 25 MG tablet,  Take 25 mg by mouth at bedtime as needed for sleep., Disp: , Rfl:  .  gabapentin (NEURONTIN) 300 MG capsule, TAKE 1 CAPSULE BY MOUTH TWICE A DAY, Disp: 180 capsule, Rfl: 1 .  loratadine (CLARITIN) 10 MG tablet, Take 10 mg by mouth daily as needed. , Disp: , Rfl:  .  nystatin (MYCOSTATIN/NYSTOP) powder, APPLY TO AFFECTED AREA 3 TIMES A DAY, Disp: 30 g, Rfl: 1 .  OMEGA-3 FATTY ACIDS PO, , Disp: , Rfl:  .  propranolol (INDERAL) 10 MG tablet, Take 1 tablet (10 mg total) by mouth 4 (four) times daily as needed (fast heart rate/palpitations)., Disp: 60 tablet, Rfl: 11 .  traZODone (DESYREL) 50 MG tablet, TAKE HALF TO ONE TABLET BY MOUTH AT BEDTIME AS NEEDED FOR SLEEP., Disp: 90 tablet, Rfl: 2 .  valACYclovir (VALTREX) 500 MG tablet, Take 1 tablet (500 mg total) by mouth 2 (two) times daily., Disp: 14 tablet, Rfl: 3 .  Vitamin D, Ergocalciferol, (DRISDOL) 50000 units CAPS capsule, TAKE ONE CAPSULE BY MOUTH VERY 7 DAYS, Disp: 12 capsule, Rfl: 1  Review of Systems  Constitutional: Negative for appetite change, chills, fatigue and fever.  HENT: Positive for trouble swallowing. Negative for congestion, drooling, ear discharge, ear pain, hoarse voice, mouth sores, postnasal drip, rhinorrhea, sinus pressure and sinus pain.   Respiratory: Negative for cough, chest tightness, shortness of breath and stridor.   Cardiovascular: Negative for chest pain and palpitations.  Gastrointestinal: Negative for abdominal pain, diarrhea, nausea and vomiting.  Musculoskeletal: Negative for neck pain.  Neurological: Positive for headaches. Negative for dizziness and weakness.    Social History   Tobacco Use  . Smoking status: Never Smoker  . Smokeless tobacco: Never Used  Substance Use Topics  . Alcohol use: Yes    Comment: 1-2 glasses wine per month      Objective:   BP 139/81 (BP Location: Left Arm, Patient Position: Sitting, Cuff Size: Large)   Pulse 80   Temp (!) 96.8 F (36 C) (Temporal)   Resp 16   Ht 5'  5" (1.651 m)   Wt 214 lb (97.1 kg)   SpO2 99%   BMI 35.61 kg/m  Vitals:   05/04/19 1507  BP: 139/81  Pulse: 80  Resp: 16  Temp: (!) 96.8 F (36 C)  TempSrc: Temporal  SpO2: 99%  Weight: 214 lb (97.1 kg)  Height: 5\' 5"  (1.651 m)     Physical Exam Vitals signs reviewed.  Constitutional:      General: She is not in acute distress.    Appearance: Normal appearance. She is well-developed. She is not diaphoretic.  HENT:     Head: Normocephalic and atraumatic.     Right Ear: Hearing, tympanic membrane, ear canal and external ear normal.     Left Ear: Hearing, tympanic membrane, ear canal and external ear normal.     Nose: Nose normal.     Mouth/Throat:     Pharynx: Uvula midline. No oropharyngeal exudate.  Eyes:     General: No scleral icterus.       Right eye: No discharge.        Left eye: No discharge.     Conjunctiva/sclera: Conjunctivae normal.     Pupils: Pupils are equal, round, and reactive to light.  Neck:     Musculoskeletal: Normal range of motion and neck supple.     Thyroid: No thyromegaly.     Trachea: No tracheal deviation.  Cardiovascular:     Rate and Rhythm: Normal rate and regular rhythm.     Heart sounds: Normal heart sounds. No murmur. No friction rub. No gallop.   Pulmonary:     Effort: Pulmonary effort is normal. No respiratory distress.     Breath sounds: Normal breath sounds. No stridor. No wheezing or rales.  Abdominal:     General: Abdomen is flat. Bowel sounds are normal. There is no distension.     Palpations: Abdomen is soft. There is no mass.     Tenderness: There is no abdominal tenderness. There is no guarding or rebound.  Lymphadenopathy:     Cervical: No cervical adenopathy.  Skin:    General: Skin is warm and dry.  Neurological:     Mental Status: She is alert and oriented to person, place, and time.      No results found for any visits on 05/04/19.     Assessment & Plan    1. Swelling of pharynx Will give steroid IM  injection today to see if that may help swelling/dysphagia sensation. Will also refer to ENT for further evaluation.  - methylPREDNISolone acetate (DEPO-MEDROL) injection 40 mg     Mar Daring, PA-C  [facility]

## 2019-05-05 ENCOUNTER — Ambulatory Visit (INDEPENDENT_AMBULATORY_CARE_PROVIDER_SITE_OTHER): Payer: Federal, State, Local not specified - PPO

## 2019-05-05 DIAGNOSIS — I471 Supraventricular tachycardia: Secondary | ICD-10-CM | POA: Diagnosis not present

## 2019-05-07 ENCOUNTER — Telehealth: Payer: Self-pay | Admitting: Physician Assistant

## 2019-05-07 NOTE — Telephone Encounter (Signed)
Does this need an OV? Please advise. Thanks!

## 2019-05-07 NOTE — Telephone Encounter (Signed)
Patient was advised and reports that she is going to look for this form.

## 2019-05-07 NOTE — Telephone Encounter (Signed)
Unerlying health issues.  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

## 2019-05-07 NOTE — Telephone Encounter (Signed)
Their should be a form if I am not mistaken to be completed. It is a small 3 page form from [place]

## 2019-05-09 ENCOUNTER — Telehealth: Payer: Self-pay | Admitting: Physician Assistant

## 2019-05-09 DIAGNOSIS — R198 Other specified symptoms and signs involving the digestive system and abdomen: Secondary | ICD-10-CM

## 2019-05-09 DIAGNOSIS — R0989 Other specified symptoms and signs involving the circulatory and respiratory systems: Secondary | ICD-10-CM

## 2019-05-09 NOTE — Telephone Encounter (Signed)
Pt called saying she was in this past Friday and was seen for feeling like she had something in her throat.  She said she was told if it continued she would need to possible be referred.  She states she is wanting to be referred out to the ENT  CB#  [phone]  [place]

## 2019-05-09 NOTE — Telephone Encounter (Signed)
Referral placed.

## 2019-05-14 ENCOUNTER — Other Ambulatory Visit (HOSPITAL_COMMUNITY): Payer: Self-pay | Admitting: Physician Assistant

## 2019-05-14 ENCOUNTER — Other Ambulatory Visit: Payer: Self-pay | Admitting: Physician Assistant

## 2019-05-14 DIAGNOSIS — R1310 Dysphagia, unspecified: Secondary | ICD-10-CM

## 2019-05-14 DIAGNOSIS — K219 Gastro-esophageal reflux disease without esophagitis: Secondary | ICD-10-CM | POA: Diagnosis not present

## 2019-05-14 DIAGNOSIS — F064 Anxiety disorder due to known physiological condition: Secondary | ICD-10-CM | POA: Diagnosis not present

## 2019-05-14 DIAGNOSIS — B001 Herpesviral vesicular dermatitis: Secondary | ICD-10-CM | POA: Diagnosis not present

## 2019-05-15 ENCOUNTER — Encounter: Payer: Self-pay | Admitting: Physician Assistant

## 2019-05-17 ENCOUNTER — Ambulatory Visit: Payer: Federal, State, Local not specified - PPO

## 2019-05-21 ENCOUNTER — Ambulatory Visit
Admission: RE | Admit: 2019-05-21 | Discharge: 2019-05-21 | Disposition: A | Discharge status: Home / Self Care | Payer: Federal, State, Local not specified - PPO | Source: Ambulatory Visit | Attending: Physician Assistant | Admitting: Physician Assistant

## 2019-05-21 ENCOUNTER — Other Ambulatory Visit: Payer: Self-pay

## 2019-05-21 DIAGNOSIS — R1310 Dysphagia, unspecified: Secondary | ICD-10-CM | POA: Insufficient documentation

## 2019-05-22 ENCOUNTER — Encounter: Payer: Self-pay | Admitting: Physician Assistant

## 2019-05-25 ENCOUNTER — Encounter: Payer: Self-pay | Admitting: *Deleted

## 2019-05-25 ENCOUNTER — Telehealth: Payer: Self-pay

## 2019-05-25 ENCOUNTER — Ambulatory Visit (INDEPENDENT_AMBULATORY_CARE_PROVIDER_SITE_OTHER): Payer: Federal, State, Local not specified - PPO | Admitting: Gastroenterology

## 2019-05-25 ENCOUNTER — Encounter: Payer: Self-pay | Admitting: Gastroenterology

## 2019-05-25 ENCOUNTER — Other Ambulatory Visit: Payer: Self-pay

## 2019-05-25 VITALS — BP 118/80 | HR 86 | Temp 97.6°F | Ht 65.0 in | Wt 212.0 lb

## 2019-05-25 DIAGNOSIS — D509 Iron deficiency anemia, unspecified: Secondary | ICD-10-CM

## 2019-05-25 DIAGNOSIS — R1310 Dysphagia, unspecified: Secondary | ICD-10-CM

## 2019-05-25 MED ORDER — NA SULFATE-K SULFATE-MG SULF 17.5-3.13-1.6 GM/177ML PO SOLN: 1.0000 | Freq: Once | ORAL | 0 refills | Status: AC

## 2019-05-25 MED ORDER — OMEPRAZOLE MAGNESIUM 20 MG PO TBEC: 20.0000 mg | DELAYED_RELEASE_TABLET | Freq: Two times a day (BID) | ORAL | 0 refills | Status: DC

## 2019-05-25 NOTE — Telephone Encounter (Signed)
   [facility] Pre-operative Risk Assessment    Request for surgical clearance:  1. What type of surgery is being performed? EGD/Colonoscopy   2. When is this surgery scheduled? 06-06-19   3. What type of clearance is required (medical clearance vs. Pharmacy clearance to hold med vs. Both)? Medical  4. Are there any medications that need to be held prior to surgery and how long? No   5. Practice name and name of physician performing surgery? [facility],  Dr. Sherri Sear   6. What is your office phone number [phone]    7.   What is your office fax number [phone]  8.   Anesthesia type (None, local, MAC, general) ? General   Virginia Woods 05/25/2019, 12:52 PM  _________________________________________________________________   (provider comments below)

## 2019-05-25 NOTE — Progress Notes (Signed)
Cephas Darby, MD [address]  [place]  [place], [place] [address]  Main: [phone]  Fax: [phone]    Gastroenterology Consultation  Referring Provider:     Bethena Roys* Primary Care Physician:  Mar Daring, PA-C Primary Gastroenterologist:  Dr. Cephas Darby Reason for Consultation:     Dysphagia        HPI:   Virginia Woods is a 48 y.o. female referred by Dr. Marlyn Corporal, Clearnce Sorrel, PA-C  for consultation & management of dysphagia.  Patient reports approximately 3 months history of sensation of fullness in her throat, feels like something stuck and reports having difficulty swallowing.  Is this does not occur every time she eats.  She also has sensation of pills stuck in her throat.  She was empirically treated by her PCP with 1 steroid injection and was referred to ENT.  Patient was seen by [facility] ENT, underwent laryngoscopy and she was told she may have reflux.  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  However, barium tablet passed normally at the end of the study.  Therefore, she is referred to GI for further evaluation  Patient reports that she has been going through significant stress at work as well as in her personal life and she is seeing a therapist currently.  She is adjusting to the virtual learning platform, she is a Radio producer, teaches in middle school.  She is taking omeprazole 20 mg as needed from her husband to 4 times out what she did that is helping her with sensation of fullness in her throat.  She does not smoke or drink alcohol.  Patient is currently wearing a heart monitor due to racing of her heart that occurs intermittently.  She takes propranolol as needed.  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  She was last seen by neurology 3 years ago.  She reports that she had MRI in the past which revealed stable size of the malformation.  Patient also has iron deficiency anemia, lowest hemoglobin 11.9 in 2016, MCV 73, serum ferritin level 13 in 2019.  Most recent hemoglobin 12.4 in 02/2019.  She reports having heavy bleeding during first 2 days of her cycle.  She denies blood in urine.  NSAIDs: None  Antiplts/Anticoagulants/Anti thrombotics: None  GI Procedures: Underwent colonoscopy in 2012 for rectal bleeding, found to have diverticulosis She denies family history of GI malignancy  Past Medical History:  Diagnosis Date   Anemia    Anxiety    Depression    Vitamin B12 deficiency    Vitamin D deficiency     Past Surgical History:  Procedure Laterality Date   Henry  2002    Current Outpatient Medications:    albuterol (PROVENTIL HFA;VENTOLIN HFA) 108 (90 Base) MCG/ACT inhaler, Inhale 2 puffs into the lungs every 6 (six) hours as needed for wheezing or shortness of breath., Disp: 1 Inhaler, Rfl: 2   ALPRAZolam (XANAX) 0.5 MG tablet, Take 1 tablet (0.5 mg total) by mouth 2 (two) times daily as needed., Disp: 180 tablet, Rfl: 1   Ascorbic Acid (VITAMIN C PO), Take by mouth., Disp: , Rfl:    citalopram (CELEXA) 40 MG  tablet, TAKE 1 TABLET BY MOUTH EVERY DAY, Disp: 90 tablet, Rfl: 1   Cyanocobalamin (VITAMIN B 12 PO), Take by mouth., Disp: , Rfl:    cyclobenzaprine (FLEXERIL) 10 MG tablet, Take 1 tablet (10 mg total) by mouth 3 (three) times daily as needed for muscle spasms., Disp: 270 tablet, Rfl: 1   diphenhydrAMINE (BENADRYL) 25 MG tablet, Take 25 mg by mouth at bedtime as needed for sleep., Disp: , Rfl:    gabapentin (NEURONTIN) 300 MG capsule, TAKE 1 CAPSULE BY MOUTH TWICE A DAY, Disp: 180 capsule, Rfl: 1   loratadine (CLARITIN) 10 MG tablet, Take 10 mg by mouth daily as needed. ,  Disp: , Rfl:    Na Sulfate-K Sulfate-Mg Sulf 17.5-3.13-1.6 GM/177ML SOLN, Take 1 kit by mouth once for 1 dose., Disp: 354 mL, Rfl: 0   nystatin (MYCOSTATIN/NYSTOP) powder, APPLY TO AFFECTED AREA 3 TIMES A DAY, Disp: 30 g, Rfl: 1   OMEGA-3 FATTY ACIDS PO, , Disp: , Rfl:    omeprazole (PRILOSEC OTC) 20 MG tablet, Take 1 tablet (20 mg total) by mouth 2 (two) times daily., Disp: 60 tablet, Rfl: 0   propranolol (INDERAL) 10 MG tablet, Take 1 tablet (10 mg total) by mouth 4 (four) times daily as needed (fast heart rate/palpitations)., Disp: 60 tablet, Rfl: 11   traZODone (DESYREL) 50 MG tablet, TAKE HALF TO ONE TABLET BY MOUTH AT BEDTIME AS NEEDED FOR SLEEP. (Patient not taking: Reported on 05/25/2019), Disp: 90 tablet, Rfl: 2   valACYclovir (VALTREX) 500 MG tablet, Take 1 tablet (500 mg total) by mouth 2 (two) times daily. (Patient not taking: Reported on 05/25/2019), Disp: 14 tablet, Rfl: 3   Vitamin D, Ergocalciferol, (DRISDOL) 50000 units CAPS capsule, TAKE ONE CAPSULE BY MOUTH VERY 7 DAYS, Disp: 12 capsule, Rfl: 1    Family History  Problem Relation Age of Onset   Hypertension Mother    Cancer Mother        Laryngeal and lymphoma   Hypertension Sister    Heart disease Maternal Grandmother      Social History   Tobacco Use   Smoking status: Never Smoker   Smokeless tobacco: Never Used  Substance Use Topics   Alcohol use: Yes    Comment: 1-2 glasses wine per month   Drug use: No    Allergies as of 05/25/2019 - Review Complete 05/25/2019  Allergen Reaction Noted   Doxycycline Itching and Rash 03/17/2017   Tamiflu [oseltamivir phosphate] Rash 08/21/2018    Review of Systems:    All systems reviewed and negative except where noted in HPI.   Physical Exam:  BP 118/80    Pulse 86    Temp 97.6 F (36.4 C)    Ht '5\' 5"'  (1.651 m)    Wt 212 lb (96.2 kg)    BMI 35.28 kg/m  No LMP recorded.  General:   Alert,  Well-developed, well-nourished, pleasant and cooperative in  NAD Head:  Normocephalic and atraumatic. Eyes:  Sclera clear, no icterus.   Conjunctiva pink. Ears:  Normal auditory acuity. Nose:  No deformity, discharge, or lesions. Mouth:  No deformity or lesions,oropharynx pink & moist. Neck:  Supple; no masses or thyromegaly. Lungs:  Respirations even and unlabored.  Clear throughout to auscultation.   No wheezes, crackles, or rhonchi. No acute distress. Heart:  Regular rate and rhythm; no murmurs, clicks, rubs, or gallops. Abdomen:  Normal bowel sounds. Soft, non-tender and non-distended without masses, hepatosplenomegaly or hernias noted.  No guarding or rebound  tenderness.   Rectal: Not performed Msk:  Symmetrical without gross deformities. Good, equal movement & strength bilaterally. Pulses:  Normal pulses noted. Extremities:  No clubbing or edema.  No cyanosis. Neurologic:  Alert and oriented x3;  grossly normal neurologically. Skin:  Intact without significant lesions or rashes. No jaundice. Psych:  Alert and cooperative. Normal mood and affect.  Imaging Studies: Reviewed  Assessment and Plan:   Kolette Vey is a 48 y.o. female with history of Arnold-Chiari malformation seen in consultation for approximately 2 months history of dysphagia, abnormal barium esophagram with possible lesion in the cervical esophagus as well as chronic iron deficiency anemia  Dysphagia Recommend EGD for further evaluation Increase omeprazole to 20 mg twice daily Discussed with her about antireflux lifestyle  Chronic iron deficiency anemia Recommend colonoscopy with TI evaluation  Follow up in 4 weeks   Cephas Darby, MD

## 2019-05-25 NOTE — Patient Instructions (Signed)

## 2019-05-30 NOTE — Telephone Encounter (Signed)
Left message for the patient to call back and speak to the on call preop APP 

## 2019-06-01 ENCOUNTER — Other Ambulatory Visit: Payer: Federal, State, Local not specified - PPO

## 2019-06-01 NOTE — Telephone Encounter (Signed)
   Primary Cardiologist: Mertie Moores, MD  Chart reviewed as part of pre-operative protocol coverage. Patient was contacted 06/01/2019 in reference to pre-operative risk assessment for pending surgery as outlined below.  Virginia Woods was last seen on 04/10/2019 by Dr. Aundra Dubin.  Since that day, Virginia Woods has done well from a cardiac standpoint. She has a history of SVT and still has intermittent palpitations; currently undergoing a cardiac monitor. She is quite active and can easily complete 4 METs without anginal complaints.   Therefore, based on ACC/AHA guidelines, the patient would be at acceptable risk for the planned procedure without further cardiovascular testing. Patient does not need to wait for the results of her cardiac monitor prior to undergoing her endoscopic procedure.  I will route this recommendation to the requesting party via Epic fax function and remove from pre-op pool.  Please call with questions.  Abigail Butts, PA-C 06/01/2019, 4:51 PM

## 2019-06-04 ENCOUNTER — Ambulatory Visit: Payer: Federal, State, Local not specified - PPO | Admitting: Gastroenterology

## 2019-06-11 ENCOUNTER — Other Ambulatory Visit: Payer: Self-pay | Admitting: Gastroenterology

## 2019-06-11 DIAGNOSIS — R1319 Other dysphagia: Secondary | ICD-10-CM

## 2019-06-11 MED ORDER — OMEPRAZOLE 40 MG PO CPDR: 40.0000 mg | DELAYED_RELEASE_CAPSULE | Freq: Two times a day (BID) | ORAL | 0 refills | Status: DC

## 2019-06-12 ENCOUNTER — Other Ambulatory Visit: Payer: Self-pay

## 2019-06-13 ENCOUNTER — Encounter: Payer: Self-pay | Admitting: *Deleted

## 2019-06-13 ENCOUNTER — Other Ambulatory Visit: Payer: Self-pay

## 2019-06-14 ENCOUNTER — Telehealth: Payer: Self-pay

## 2019-06-14 NOTE — Telephone Encounter (Signed)
-----   Message from Thayer Headings, MD sent at 06/14/2019  7:24 AM EDT ----- Pt was found to have episodes of SVT ( as we suspected) I have discussed with Dr. Lovena Le and with the patient. Will set up a consultation with Dr. Lovena Le for consideration of ablation

## 2019-06-15 ENCOUNTER — Other Ambulatory Visit (HOSPITAL_COMMUNITY): Payer: Federal, State, Local not specified - PPO

## 2019-06-18 ENCOUNTER — Other Ambulatory Visit (HOSPITAL_COMMUNITY)
Admission: RE | Admit: 2019-06-18 | Discharge: 2019-06-18 | Disposition: A | Discharge status: Home / Self Care | Payer: Federal, State, Local not specified - PPO | Source: Ambulatory Visit | Attending: Gastroenterology | Admitting: Gastroenterology

## 2019-06-18 DIAGNOSIS — Z20828 Contact with and (suspected) exposure to other viral communicable diseases: Secondary | ICD-10-CM | POA: Insufficient documentation

## 2019-06-18 DIAGNOSIS — Z01812 Encounter for preprocedural laboratory examination: Secondary | ICD-10-CM | POA: Diagnosis not present

## 2019-06-18 LAB — SARS CORONAVIRUS 2 (TAT 6-24 HRS): SARS Coronavirus 2: NEGATIVE

## 2019-06-19 NOTE — Discharge Instructions (Signed)

## 2019-06-20 ENCOUNTER — Ambulatory Visit: Payer: Federal, State, Local not specified - PPO | Admitting: Anesthesiology

## 2019-06-20 ENCOUNTER — Ambulatory Visit
Admission: RE | Admit: 2019-06-20 | Discharge: 2019-06-20 | Disposition: A | Discharge status: Home / Self Care | Payer: Federal, State, Local not specified - PPO | Attending: Gastroenterology | Admitting: Gastroenterology

## 2019-06-20 ENCOUNTER — Other Ambulatory Visit: Payer: Self-pay

## 2019-06-20 ENCOUNTER — Encounter
Admission: RE | Disposition: A | Discharge status: Home / Self Care | Payer: Self-pay | Source: Home / Self Care | Attending: Gastroenterology

## 2019-06-20 DIAGNOSIS — R1310 Dysphagia, unspecified: Secondary | ICD-10-CM | POA: Diagnosis not present

## 2019-06-20 DIAGNOSIS — G4730 Sleep apnea, unspecified: Secondary | ICD-10-CM | POA: Insufficient documentation

## 2019-06-20 DIAGNOSIS — Z87798 Personal history of other (corrected) congenital malformations: Secondary | ICD-10-CM | POA: Diagnosis not present

## 2019-06-20 DIAGNOSIS — D509 Iron deficiency anemia, unspecified: Secondary | ICD-10-CM | POA: Diagnosis not present

## 2019-06-20 DIAGNOSIS — E559 Vitamin D deficiency, unspecified: Secondary | ICD-10-CM | POA: Insufficient documentation

## 2019-06-20 DIAGNOSIS — E538 Deficiency of other specified B group vitamins: Secondary | ICD-10-CM | POA: Insufficient documentation

## 2019-06-20 DIAGNOSIS — K5730 Diverticulosis of large intestine without perforation or abscess without bleeding: Secondary | ICD-10-CM | POA: Diagnosis not present

## 2019-06-20 DIAGNOSIS — K228 Other specified diseases of esophagus: Secondary | ICD-10-CM | POA: Diagnosis not present

## 2019-06-20 DIAGNOSIS — K2950 Unspecified chronic gastritis without bleeding: Secondary | ICD-10-CM | POA: Insufficient documentation

## 2019-06-20 DIAGNOSIS — D508 Other iron deficiency anemias: Secondary | ICD-10-CM | POA: Diagnosis not present

## 2019-06-20 DIAGNOSIS — Z79899 Other long term (current) drug therapy: Secondary | ICD-10-CM | POA: Diagnosis not present

## 2019-06-20 DIAGNOSIS — F329 Major depressive disorder, single episode, unspecified: Secondary | ICD-10-CM | POA: Insufficient documentation

## 2019-06-20 DIAGNOSIS — F419 Anxiety disorder, unspecified: Secondary | ICD-10-CM | POA: Diagnosis not present

## 2019-06-20 DIAGNOSIS — K219 Gastro-esophageal reflux disease without esophagitis: Secondary | ICD-10-CM

## 2019-06-20 DIAGNOSIS — K2930 Chronic superficial gastritis without bleeding: Secondary | ICD-10-CM | POA: Diagnosis not present

## 2019-06-20 HISTORY — DX: Presence of dental prosthetic device (complete) (partial): Z97.2

## 2019-06-20 HISTORY — PX: ESOPHAGOGASTRODUODENOSCOPY (EGD) WITH PROPOFOL: SHX5813

## 2019-06-20 HISTORY — DX: Headache, unspecified: R51.9

## 2019-06-20 HISTORY — DX: Other complications of anesthesia, initial encounter: T88.59XA

## 2019-06-20 HISTORY — DX: Presence of spectacles and contact lenses: Z97.3

## 2019-06-20 HISTORY — DX: Family history of other specified conditions: Z84.89

## 2019-06-20 HISTORY — DX: Failed or difficult intubation, initial encounter: T88.4XXA

## 2019-06-20 HISTORY — PX: COLONOSCOPY WITH PROPOFOL: SHX5780

## 2019-06-20 LAB — POCT PREGNANCY, URINE: Preg Test, Ur: NEGATIVE

## 2019-06-20 SURGERY — COLONOSCOPY WITH PROPOFOL
Anesthesia: General

## 2019-06-20 MED ORDER — ACETAMINOPHEN 160 MG/5ML PO SOLN: 325.0000 mg | Freq: Once | ORAL | Status: DC

## 2019-06-20 MED ORDER — GLYCOPYRROLATE 0.2 MG/ML IJ SOLN
INTRAMUSCULAR | Status: DC | PRN
  Administered 2019-06-20: 0.1 mg via INTRAVENOUS

## 2019-06-20 MED ORDER — LACTATED RINGERS IV SOLN
INTRAVENOUS | Status: DC
  Administered 2019-06-20: 11:00:00 via INTRAVENOUS

## 2019-06-20 MED ORDER — LIDOCAINE HCL (CARDIAC) PF 100 MG/5ML IV SOSY
PREFILLED_SYRINGE | INTRAVENOUS | Status: DC | PRN
  Administered 2019-06-20: 30 mg via INTRAVENOUS

## 2019-06-20 MED ORDER — STERILE WATER FOR IRRIGATION IR SOLN
Status: DC | PRN
  Administered 2019-06-20: 100 mL

## 2019-06-20 MED ORDER — ACETAMINOPHEN 325 MG PO TABS: 325.0000 mg | ORAL_TABLET | Freq: Once | ORAL | Status: DC

## 2019-06-20 MED ORDER — PROPOFOL 10 MG/ML IV BOLUS
INTRAVENOUS | Status: DC | PRN
  Administered 2019-06-20 (×3): 30 mg via INTRAVENOUS
  Administered 2019-06-20: 120 mg via INTRAVENOUS
  Administered 2019-06-20 (×3): 30 mg via INTRAVENOUS
  Administered 2019-06-20: 20 mg via INTRAVENOUS
  Administered 2019-06-20: 40 mg via INTRAVENOUS
  Administered 2019-06-20 (×2): 20 mg via INTRAVENOUS

## 2019-06-20 SURGICAL SUPPLY — 37 items
BALLN DILATOR 10-12 8 (BALLOONS)
BALLN DILATOR 12-15 8 (BALLOONS)
BALLN DILATOR 15-18 8 (BALLOONS)
BALLN DILATOR CRE 0-12 8 (BALLOONS)
BALLN DILATOR ESOPH 8 10 CRE (MISCELLANEOUS) IMPLANT
BALLOON DILATOR 12-15 8 (BALLOONS) IMPLANT
BALLOON DILATOR 15-18 8 (BALLOONS) IMPLANT
BALLOON DILATOR CRE 0-12 8 (BALLOONS) IMPLANT
BLOCK BITE 60FR ADLT L/F GRN (MISCELLANEOUS) ×2 IMPLANT
CANISTER SUCT 1200ML W/VALVE (MISCELLANEOUS) ×2 IMPLANT
CLIP HMST 235XBRD CATH ROT (MISCELLANEOUS) IMPLANT
CLIP RESOLUTION 360 11X235 (MISCELLANEOUS)
ELECT REM PT RETURN 9FT ADLT (ELECTROSURGICAL)
ELECTRODE REM PT RTRN 9FT ADLT (ELECTROSURGICAL) IMPLANT
FCP ESCP3.2XJMB 240X2.8X (MISCELLANEOUS) ×1
FORCEPS BIOP RAD 4 LRG CAP 4 (CUTTING FORCEPS) IMPLANT
FORCEPS BIOP RJ4 240 W/NDL (MISCELLANEOUS) ×1
FORCEPS ESCP3.2XJMB 240X2.8X (MISCELLANEOUS) ×1 IMPLANT
GOWN CVR UNV OPN BCK APRN NK (MISCELLANEOUS) ×2 IMPLANT
GOWN ISOL THUMB LOOP REG UNIV (MISCELLANEOUS) ×2
INJECTOR VARIJECT VIN23 (MISCELLANEOUS) IMPLANT
KIT DEFENDO VALVE AND CONN (KITS) IMPLANT
KIT ENDO PROCEDURE OLY (KITS) ×2 IMPLANT
MARKER SPOT ENDO TATTOO 5ML (MISCELLANEOUS) IMPLANT
PROBE APC STR FIRE (PROBE) IMPLANT
RETRIEVER NET PLAT FOOD (MISCELLANEOUS) IMPLANT
RETRIEVER NET ROTH 2.5X230 LF (MISCELLANEOUS) IMPLANT
SNARE COLD EXACTO (MISCELLANEOUS) IMPLANT
SNARE SHORT THROW 13M SML OVAL (MISCELLANEOUS) IMPLANT
SNARE SHORT THROW 30M LRG OVAL (MISCELLANEOUS) IMPLANT
SNARE SNG USE RND 15MM (INSTRUMENTS) IMPLANT
SPOT EX ENDOSCOPIC TATTOO (MISCELLANEOUS)
SYR INFLATION 60ML (SYRINGE) IMPLANT
TRAP ETRAP POLY (MISCELLANEOUS) IMPLANT
VARIJECT INJECTOR VIN23 (MISCELLANEOUS)
WATER STERILE IRR 250ML POUR (IV SOLUTION) ×2 IMPLANT
WIRE CRE 18-20MM 8CM F G (MISCELLANEOUS) IMPLANT

## 2019-06-20 NOTE — Anesthesia Postprocedure Evaluation (Signed)
Anesthesia Post Note  Patient: Virginia Woods  Procedure(s) Performed: COLONOSCOPY WITH PROPOFOL (N/A ) ESOPHAGOGASTRODUODENOSCOPY (EGD) WITH PROPOFOL (N/A )  Patient location during evaluation: PACU Anesthesia Type: General Level of consciousness: awake and alert and oriented Pain management: satisfactory to patient Vital Signs Assessment: post-procedure vital signs reviewed and stable Respiratory status: spontaneous breathing, nonlabored ventilation and respiratory function stable Cardiovascular status: blood pressure returned to baseline and stable Postop Assessment: Adequate PO intake and No signs of nausea or vomiting Anesthetic complications: no    Raliegh Ip

## 2019-06-20 NOTE — Transfer of Care (Signed)
Immediate Anesthesia Transfer of Care Note  Patient: Virginia Woods  Procedure(s) Performed: COLONOSCOPY WITH PROPOFOL (N/A ) ESOPHAGOGASTRODUODENOSCOPY (EGD) WITH PROPOFOL (N/A )  Patient Location: PACU  Anesthesia Type: General  Level of Consciousness: awake, alert  and patient cooperative  Airway and Oxygen Therapy: Patient Spontanous Breathing and Patient connected to supplemental oxygen  Post-op Assessment: Post-op Vital signs reviewed, Patient's Cardiovascular Status Stable, Respiratory Function Stable, Patent Airway and No signs of Nausea or vomiting  Post-op Vital Signs: Reviewed and stable  Complications: No apparent anesthesia complications

## 2019-06-20 NOTE — Op Note (Signed)
[facility] Gastroenterology Patient Name: Virginia Woods Procedure Date: 06/20/2019 10:29 AM MRN: [id number] Account #: [id number] Date of Birth: Aug 05, 1971 Admit Type: Outpatient Age: 48 Room: [facility] OR ROOM 01 Gender: Female Note Status: Finalized Procedure:            Upper GI endoscopy Indications:          Unexplained iron deficiency anemia, Dysphagia Providers:            Lin Landsman MD, MD Referring MD:         Mar Daring (Referring MD) Medicines:            Monitored Anesthesia Care Complications:        No immediate complications. Estimated blood loss: None. Procedure:            Pre-Anesthesia Assessment:                       - Prior to the procedure, a History and Physical was                        performed, and patient medications and allergies were                        reviewed. The patient is competent. The risks and                        benefits of the procedure and the sedation options and                        risks were discussed with the patient. All questions                        were answered and informed consent was obtained.                        Patient identification and proposed procedure were                        verified by the physician, the nurse, the                        anesthesiologist, the anesthetist and the technician in                        the pre-procedure area in the procedure room in the                        endoscopy suite. Mental Status Examination: alert and                        oriented. Airway Examination: normal oropharyngeal                        airway and neck mobility. Respiratory Examination:                        clear to auscultation. CV Examination: normal.                        Prophylactic Antibiotics: The patient does not require  prophylactic antibiotics. Prior Anticoagulants: The                        patient has taken no previous anticoagulant  or                        antiplatelet agents. ASA Grade Assessment: III - A                        patient with severe systemic disease. After reviewing                        the risks and benefits, the patient was deemed in                        satisfactory condition to undergo the procedure. The                        anesthesia plan was to use monitored anesthesia care                        (MAC). Immediately prior to administration of                        medications, the patient was re-assessed for adequacy                        to receive sedatives. The heart rate, respiratory rate,                        oxygen saturations, blood pressure, adequacy of                        pulmonary ventilation, and response to care were                        monitored throughout the procedure. The physical status                        of the patient was re-assessed after the procedure.                       After obtaining informed consent, the endoscope was                        passed under direct vision. Throughout the procedure,                        the patient's blood pressure, pulse, and oxygen                        saturations were monitored continuously. The was                        introduced through the mouth, and advanced to the                        second part of duodenum. The upper GI endoscopy was  accomplished without difficulty. The patient tolerated                        the procedure well. Findings:      The duodenal bulb and second portion of the duodenum were normal.      The entire examined stomach was normal. Biopsies were taken with a cold       forceps for Helicobacter pylori testing.      The cardia and gastric fundus were normal on retroflexion.      The gastroesophageal junction and examined esophagus were normal.       Biopsies were taken with a cold forceps for histology. Impression:           - Normal duodenal bulb and second  portion of the                        duodenum.                       - Normal stomach. Biopsied.                       - Normal gastroesophageal junction and esophagus.                        Biopsied. Recommendation:       - Await pathology results.                       - Continue present medications.                       - Follow an antireflux regimen.                       - Proceed with colonoscopy as scheduled                       See colonoscopy report Procedure Code(s):    --- Professional ---                       [phone], Esophagogastroduodenoscopy, flexible, transoral;                        with biopsy, single or multiple Diagnosis Code(s):    --- Professional ---                       D50.9, Iron deficiency anemia, unspecified                       R13.10, Dysphagia, unspecified CPT copyright 2019 American Medical Association. All rights reserved. The codes documented in this report are preliminary and upon coder review may  be revised to meet current compliance requirements. Dr. Ulyess Mort Lin Landsman MD, MD 06/20/2019 10:52:35 AM This report has been signed electronically. Number of Addenda: 0 Note Initiated On: 06/20/2019 10:29 AM Total Procedure Duration: 0 hours 10 minutes 8 seconds  Estimated Blood Loss: Estimated blood loss: none.      [facility]

## 2019-06-20 NOTE — Op Note (Signed)
[facility] Gastroenterology Patient Name: Virginia Woods Procedure Date: 06/20/2019 10:26 AM MRN: [id number] Account #: [id number] Date of Birth: 1971/05/16 Admit Type: Outpatient Age: 48 Room: [facility] OR ROOM 01 Gender: Female Note Status: Finalized Procedure:            Colonoscopy Indications:          This is the patient's first colonoscopy, Unexplained                        iron deficiency anemia Providers:            Lin Landsman MD, MD Medicines:            Monitored Anesthesia Care Complications:        No immediate complications. Estimated blood loss: None. Procedure:            Pre-Anesthesia Assessment:                       - Prior to the procedure, a History and Physical was                        performed, and patient medications and allergies were                        reviewed. The patient is competent. The risks and                        benefits of the procedure and the sedation options and                        risks were discussed with the patient. All questions                        were answered and informed consent was obtained.                        Patient identification and proposed procedure were                        verified by the physician, the nurse, the                        anesthesiologist, the anesthetist and the technician in                        the pre-procedure area in the procedure room in the                        endoscopy suite. Mental Status Examination: alert and                        oriented. Airway Examination: normal oropharyngeal                        airway and neck mobility. Respiratory Examination:                        clear to auscultation. CV Examination: normal.  Prophylactic Antibiotics: The patient does not require                        prophylactic antibiotics. Prior Anticoagulants: The                        patient has taken no previous anticoagulant or                         antiplatelet agents. ASA Grade Assessment: III - A                        patient with severe systemic disease. After reviewing                        the risks and benefits, the patient was deemed in                        satisfactory condition to undergo the procedure. The                        anesthesia plan was to use monitored anesthesia care                        (MAC). Immediately prior to administration of                        medications, the patient was re-assessed for adequacy                        to receive sedatives. The heart rate, respiratory rate,                        oxygen saturations, blood pressure, adequacy of                        pulmonary ventilation, and response to care were                        monitored throughout the procedure. The physical status                        of the patient was re-assessed after the procedure.                       After obtaining informed consent, the colonoscope was                        passed under direct vision. Throughout the procedure,                        the patient's blood pressure, pulse, and oxygen                        saturations were monitored continuously. The was                        introduced through the anus and advanced to the the  terminal ileum. The colonoscopy was performed without                        difficulty. The patient tolerated the procedure well.                        The quality of the bowel preparation was evaluated                        using the BBPS [facility] Bowel Preparation Scale) with                        scores of: Right Colon = 3, Transverse Colon = 3 and                        Left Colon = 3 (entire mucosa seen well with no                        residual staining, small fragments of stool or opaque                        liquid). The total BBPS score equals 9. Findings:      The perianal and digital rectal examinations were normal.  Pertinent       negatives include normal sphincter tone and no palpable rectal lesions.      The terminal ileum appeared normal.      Multiple diverticula were found in the entire colon. There was no       evidence of diverticular bleeding.      The retroflexed view of the distal rectum and anal verge was normal and       showed no anal or rectal abnormalities. Impression:           - The examined portion of the ileum was normal.                       - Severe diverticulosis in the entire examined colon.                        There was no evidence of diverticular bleeding.                       - The distal rectum and anal verge are normal on                        retroflexion view.                       - No specimens collected. Recommendation:       - Discharge patient to home (with escort).                       - Resume previous diet today.                       - Continue present medications.                       - Repeat colonoscopy in 10 years for surveillance.                       -  Return to my office as previously scheduled. Procedure Code(s):    --- Professional ---                       [phone], Colonoscopy, flexible; diagnostic, including                        collection of specimen(s) by brushing or washing, when                        performed (separate procedure) Diagnosis Code(s):    --- Professional ---                       D50.9, Iron deficiency anemia, unspecified                       K57.30, Diverticulosis of large intestine without                        perforation or abscess without bleeding CPT copyright 2019 American Medical Association. All rights reserved. The codes documented in this report are preliminary and upon coder review may  be revised to meet current compliance requirements. Dr. Ulyess Mort Lin Landsman MD, MD 06/20/2019 11:06:07 AM This report has been signed electronically. Number of Addenda: 0 Note Initiated On: 06/20/2019 10:26  AM Scope Withdrawal Time: 0 hours 6 minutes 36 seconds  Total Procedure Duration: 0 hours 9 minutes 15 seconds  Estimated Blood Loss: Estimated blood loss: none.      [facility]

## 2019-06-20 NOTE — H&P (Signed)
Cephas Darby, MD [address]  [place]  [place], [place] [address]  Main: [phone]  Fax: [phone] Pager: [phone]  Primary Care Physician:  Mar Daring, PA-C Primary Gastroenterologist:  Dr. Cephas Darby  Pre-Procedure History & Physical: HPI:  Virginia Woods is a 48 y.o. female is here for an endoscopy and colonoscopy.   Past Medical History:  Diagnosis Date  . Anemia   . Anxiety   . Complication of anesthesia    pt reports some complication after 2nd brain surgery, but unsure of what it was.  . Dental bridge present    Perrmanent dental retainer - bottom  . Depression   . Difficult intubation    with 2nd chiari surgery (per pt)  . Family history of adverse reaction to anesthesia    reports mother had a "negative reaction" to "anesthesia or lidocaine"  . Headache   . History of Chiari malformation 2011   2 surgeries 2011 [facility]  . Vitamin B12 deficiency   . Vitamin D deficiency   . Wears contact lenses     Past Surgical History:  Procedure Laterality Date  . BRAIN SURGERY  2011   2 surgeries for chiari malformation - [facility]  . CESAREAN SECTION  1996  . COLONOSCOPY  2014  . TUBAL LIGATION  2002    Prior to Admission medications   Medication Sig Start Date End Date Taking? Authorizing Provider  albuterol (PROVENTIL HFA;VENTOLIN HFA) 108 (90 Base) MCG/ACT inhaler Inhale 2 puffs into the lungs every 6 (six) hours as needed for wheezing or shortness of breath. 04/28/18  Yes Mar Daring, PA-C  ALPRAZolam Duanne Moron) 0.5 MG tablet Take 1 tablet (0.5 mg total) by mouth 2 (two) times daily as needed. 06/28/18  Yes Mar Daring, PA-C  Ascorbic Acid (VITAMIN C PO) Take by mouth.   Yes [provider]  citalopram (CELEXA) 40 MG tablet TAKE 1 TABLET BY MOUTH EVERY DAY 03/12/19  Yes Burnette, Anderson Malta M, PA-C  Cyanocobalamin (VITAMIN B 12 PO) Take by mouth.   Yes [provider]  cyclobenzaprine (FLEXERIL) 10 MG  tablet Take 1 tablet (10 mg total) by mouth 3 (three) times daily as needed for muscle spasms. 06/28/18  Yes Mar Daring, PA-C  diphenhydrAMINE (BENADRYL) 25 MG tablet Take 25 mg by mouth at bedtime as needed for sleep.   Yes [provider]  gabapentin (NEURONTIN) 300 MG capsule TAKE 1 CAPSULE BY MOUTH TWICE A DAY 01/01/19  Yes Mar Daring, PA-C  OMEGA-3 FATTY ACIDS PO  05/01/09  Yes [provider]  omeprazole (PRILOSEC OTC) 20 MG tablet Take 1 tablet (20 mg total) by mouth 2 (two) times daily. 05/25/19 06/24/19 Yes Mazelle Huebert, Tally Due, MD  omeprazole (PRILOSEC) 40 MG capsule Take 1 capsule (40 mg total) by mouth 2 (two) times daily before a meal. 06/11/19 07/11/19 Yes Mohab Ashby, Tally Due, MD  Vitamin D, Ergocalciferol, (DRISDOL) 50000 units CAPS capsule TAKE ONE CAPSULE BY MOUTH VERY 7 DAYS 06/29/18  Yes Fenton Malling M, PA-C  loratadine (CLARITIN) 10 MG tablet Take 10 mg by mouth daily as needed.     [provider]  nystatin (MYCOSTATIN/NYSTOP) powder APPLY TO AFFECTED AREA 3 TIMES A DAY 03/12/19   Fenton Malling M, PA-C  propranolol (INDERAL) 10 MG tablet Take 1 tablet (10 mg total) by mouth 4 (four) times daily as needed (fast heart rate/palpitations). Patient not taking: Reported on 06/13/2019 04/10/19   Nahser, Wonda Cheng, MD  traZODone (DESYREL) 50 MG tablet TAKE HALF TO ONE TABLET BY MOUTH AT BEDTIME AS NEEDED FOR SLEEP. Patient not taking: TAKE HALF TO ONE TABLET BY MOUTH AT BEDTIME AS NEEDED FOR SLEEP. 07/20/18   Bacigalupo, Dionne Bucy, MD  valACYclovir (VALTREX) 500 MG tablet Take 1 tablet (500 mg total) by mouth 2 (two) times daily. Patient not taking: Reported on 05/25/2019 03/07/17   Mar Daring, PA-C    Allergies as of 05/25/2019 - Review Complete 05/25/2019  Allergen Reaction Noted  . Doxycycline Itching and Rash 03/17/2017  . Tamiflu [oseltamivir phosphate] Rash 08/21/2018    Family History  Problem Relation Age of Onset  .  Hypertension Mother   . Cancer Mother        Laryngeal and lymphoma  . Hypertension Sister   . Heart disease Maternal Grandmother     Social History   Socioeconomic History  . Marital status: Married    Spouse name: Not on file  . Number of children: Not on file  . Years of education: Not on file  . Highest education level: Not on file  Occupational History  . Not on file  Social Needs  . Financial resource strain: Not on file  . Food insecurity    Worry: Not on file    Inability: Not on file  . Transportation needs    Medical: Not on file    Non-medical: Not on file  Tobacco Use  . Smoking status: Never Smoker  . Smokeless tobacco: Never Used  Substance and Sexual Activity  . Alcohol use: Yes    Comment: 1-2 glasses wine per month  . Drug use: No  . Sexual activity: Not on file  Lifestyle  . Physical activity    Days per week: Not on file    Minutes per session: Not on file  . Stress: Not on file  Relationships  . Social Herbalist on phone: Not on file    Gets together: Not on file    Attends religious service: Not on file    Active member of club or organization: Not on file    Attends meetings of clubs or organizations: Not on file    Relationship status: Not on file  . Intimate partner violence    Fear of current or ex partner: Not on file    Emotionally abused: Not on file    Physically abused: Not on file    Forced sexual activity: Not on file  Other Topics Concern  . Not on file  Social History Narrative  . Not on file    Review of Systems: See HPI, otherwise negative ROS  Physical Exam: BP 129/76   Pulse (!) 102   Temp 97.6 F (36.4 C)   Ht 5\' 5"  (1.651 m)   Wt 93.9 kg   LMP 05/27/2019 (Exact Date) Comment: preg test negative  SpO2 100%   BMI 34.45 kg/m  General:   Alert,  pleasant and cooperative in NAD Head:  Normocephalic and atraumatic. Neck:  Supple; no masses or thyromegaly. Lungs:  Clear throughout to auscultation.     Heart:  Regular rate and rhythm. Abdomen:  Soft, nontender and nondistended. Normal bowel sounds, without guarding, and without rebound.   Neurologic:  Alert and  oriented x4;  grossly normal neurologically.  Impression/Plan: Virginia Woods is here for an endoscopy and colonoscopy to be performed for dysphagia, IDA  Risks, benefits, limitations, and alternatives regarding  endoscopy and colonoscopy have been  reviewed with the patient.  Questions have been answered.  All parties agreeable.   Sherri Sear, MD  06/20/2019, 10:21 AM

## 2019-06-20 NOTE — Anesthesia Preprocedure Evaluation (Signed)
Anesthesia Evaluation  Patient identified by MRN, date of birth, ID band Patient awake    Reviewed: Allergy & Precautions, H&P , NPO status , Patient's Chart, lab work & pertinent test results  History of Anesthesia Complications (+) DIFFICULT AIRWAY and history of anesthetic complications  Airway Mallampati: II  TM Distance: >3 FB Neck ROM: full    Dental no notable dental hx.    Pulmonary sleep apnea ,    Pulmonary exam normal breath sounds clear to auscultation       Cardiovascular + dysrhythmias Supra Ventricular Tachycardia  Rhythm:regular Rate:Normal     Neuro/Psych PSYCHIATRIC DISORDERS    GI/Hepatic   Endo/Other    Renal/GU      Musculoskeletal   Abdominal   Peds  Hematology   Anesthesia Other Findings   Reproductive/Obstetrics                             Anesthesia Physical Anesthesia Plan  ASA: III  Anesthesia Plan: General   Post-op Pain Management:    Induction: Intravenous  PONV Risk Score and Plan: 3 and Propofol infusion, Treatment may vary due to age or medical condition and TIVA  Airway Management Planned: Natural Airway  Additional Equipment:   Intra-op Plan:   Post-operative Plan:   Informed Consent: I have reviewed the patients History and Physical, chart, labs and discussed the procedure including the risks, benefits and alternatives for the proposed anesthesia with the patient or authorized representative who has indicated his/her understanding and acceptance.       Plan Discussed with: CRNA  Anesthesia Plan Comments:         Anesthesia Quick Evaluation

## 2019-06-20 NOTE — Anesthesia Procedure Notes (Signed)
Date/Time: 06/20/2019 10:35 AM Performed by: Cameron Ali, CRNA Pre-anesthesia Checklist: Patient identified, Emergency Drugs available, Suction available, Timeout performed and Patient being monitored Patient Re-evaluated:Patient Re-evaluated prior to induction Oxygen Delivery Method: Nasal cannula Placement Confirmation: positive ETCO2

## 2019-06-25 ENCOUNTER — Encounter: Payer: Self-pay | Admitting: Gastroenterology

## 2019-06-26 ENCOUNTER — Other Ambulatory Visit: Payer: Self-pay

## 2019-06-26 ENCOUNTER — Telehealth: Payer: Self-pay

## 2019-06-26 DIAGNOSIS — D508 Other iron deficiency anemias: Secondary | ICD-10-CM

## 2019-06-26 DIAGNOSIS — D509 Iron deficiency anemia, unspecified: Secondary | ICD-10-CM

## 2019-06-26 NOTE — Telephone Encounter (Signed)
-----   Message from Lin Landsman, MD sent at 06/25/2019  5:03 PM EDT ----- Results released via my chart.  Recommend to schedule video capsule endoscopy Diagnosis: unexplained iron deficiency, normal EGD and colonoscopy  Rohini Vanga

## 2019-06-26 NOTE — Telephone Encounter (Signed)
Patient is scheduled for Capsule study on 07/11/2019. Went over instructions with patient and mailed them and sent to [facility]

## 2019-06-27 ENCOUNTER — Encounter: Payer: Self-pay | Admitting: Gastroenterology

## 2019-06-27 ENCOUNTER — Ambulatory Visit (INDEPENDENT_AMBULATORY_CARE_PROVIDER_SITE_OTHER): Payer: Federal, State, Local not specified - PPO | Admitting: Gastroenterology

## 2019-06-27 ENCOUNTER — Other Ambulatory Visit: Payer: Self-pay

## 2019-06-27 ENCOUNTER — Other Ambulatory Visit: Payer: Self-pay | Admitting: Gastroenterology

## 2019-06-27 VITALS — BP 113/74 | HR 80 | Temp 98.3°F | Ht 65.0 in | Wt 213.2 lb

## 2019-06-27 DIAGNOSIS — R221 Localized swelling, mass and lump, neck: Secondary | ICD-10-CM | POA: Diagnosis not present

## 2019-06-27 DIAGNOSIS — D509 Iron deficiency anemia, unspecified: Secondary | ICD-10-CM

## 2019-06-27 NOTE — Progress Notes (Signed)
Cephas Darby, MD [address]  [place]  [place], [place] [address]  Main: [phone]  Fax: [phone]    Gastroenterology Consultation  Referring Provider:     Florian Buff* Primary Care Physician:  Mar Daring, PA-C Primary Gastroenterologist:  Dr. Cephas Darby Reason for Consultation:     Dysphagia        HPI:   Virginia Woods is a 48 y.o. female referred by Dr. Marlyn Corporal, Clearnce Sorrel, PA-C  for consultation & management of dysphagia.  Patient reports approximately 3 months history of sensation of fullness in her throat, feels like something stuck and reports having difficulty swallowing.  Is this does not occur every time she eats.  She also has sensation of pills stuck in her throat.  She was empirically treated by her PCP with 1 steroid injection and was referred to ENT.  Patient was seen by [facility], underwent laryngoscopy and she was told she may have reflux.  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  However, barium tablet passed normally at the end of the study.  Therefore, she is referred to GI for further evaluation  Patient reports that she has been going through significant stress at work as well as in her personal life and she is seeing a therapist currently.  She is adjusting to the virtual learning platform, she is a Radio producer, teaches in middle school.  She is taking omeprazole 20 mg as needed from her husband to 4 times out what she did that is helping her with sensation of fullness in her throat.  She does not smoke or drink alcohol.  Patient is currently wearing a heart monitor due to racing of her heart that occurs intermittently.  She takes propranolol as needed.  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  She was last seen by neurology 3 years ago.  She reports that she had MRI in the past which revealed stable size of the malformation.  Patient also has iron deficiency anemia, lowest hemoglobin 11.9 in 2016, MCV 73, serum ferritin level 13 in 2019.  Most recent hemoglobin 12.4 in 02/2019.  She reports having heavy bleeding during first 2 days of her cycle.  She denies blood in urine.  Follow-up visit 06/27/2019 Since last visit, patient underwent EGD and colonoscopy which were unremarkable.  There was no evidence of eosinophilic esophagitis, or H. Pylori. She continues to experience the sensation of something stuck or fullness. Currently on prilosec 20mg  BID. She says it's modestly helping. She is also seeing cardiologist tomorrow for cardiac monitor assessment. She is very stressed as she has to return to school on 10/20 and worried about COVID. I reassured her that she is at average risk as she is not immunocompromised and should follow precautions as recommended by CDC. She is scheduled for VCE on 10/21  NSAIDs: None  Antiplts/Anticoagulants/Anti thrombotics: None  GI Procedures: Underwent colonoscopy in 2012 for rectal bleeding, found to have diverticulosis  EGD and colonoscopy 06/20/2019 - Normal duodenal bulb and second portion of the duodenum. - Normal stomach. Biopsied. - Normal gastroesophageal junction and esophagus. Biopsied.  - The examined portion of the ileum was normal. - Severe diverticulosis in the entire examined colon. There was no evidence of diverticular  bleeding. - The distal rectum and anal verge are normal on retroflexion view. - No specimens collected.    She denies family history of GI malignancy  Past Medical History:  Diagnosis Date  . Anemia   . Anxiety   . Complication of anesthesia    pt reports some complication after 2nd brain surgery, but unsure of what it was.  . Dental bridge present    Perrmanent dental retainer -  bottom  . Depression   . Difficult intubation    with 2nd chiari surgery (per pt)  . Family history of adverse reaction to anesthesia    reports mother had a "negative reaction" to "anesthesia or lidocaine"  . Headache   . History of Chiari malformation 2011   2 surgeries 2011 [facility]  . Vitamin B12 deficiency   . Vitamin D deficiency   . Wears contact lenses     Past Surgical History:  Procedure Laterality Date  . BRAIN SURGERY  2011   2 surgeries for chiari malformation - [facility]  . CESAREAN SECTION  1996  . COLONOSCOPY  2014  . COLONOSCOPY WITH PROPOFOL N/A 06/20/2019   Procedure: COLONOSCOPY WITH PROPOFOL;  Surgeon: Lin Landsman, MD;  Location: [facility];  Service: Endoscopy;  Laterality: N/A;  . ESOPHAGOGASTRODUODENOSCOPY (EGD) WITH PROPOFOL N/A 06/20/2019   Procedure: ESOPHAGOGASTRODUODENOSCOPY (EGD) WITH PROPOFOL;  Surgeon: Lin Landsman, MD;  Location: [facility];  Service: Endoscopy;  Laterality: N/A;  . TUBAL LIGATION  2002    Current Outpatient Medications:  .  albuterol (PROVENTIL HFA;VENTOLIN HFA) 108 (90 Base) MCG/ACT inhaler, Inhale 2 puffs into the lungs every 6 (six) hours as needed for wheezing or shortness of breath., Disp: 1 Inhaler, Rfl: 2 .  ALPRAZolam (XANAX) 0.5 MG tablet, Take 1 tablet (0.5 mg total) by mouth 2 (two) times daily as needed., Disp: 180 tablet, Rfl: 1 .  Ascorbic Acid (VITAMIN C PO), Take by mouth., Disp: , Rfl:  .  citalopram (CELEXA) 40 MG tablet, TAKE 1 TABLET BY MOUTH EVERY DAY, Disp: 90 tablet, Rfl: 1 .  Cyanocobalamin (VITAMIN B 12 PO), Take by mouth., Disp: , Rfl:  .  cyclobenzaprine (FLEXERIL) 10 MG tablet, Take 1 tablet (10 mg total) by mouth 3 (three) times daily as needed for muscle spasms., Disp: 270 tablet, Rfl: 1 .  diphenhydrAMINE (BENADRYL) 25 MG tablet, Take 25 mg by mouth at bedtime as needed for sleep., Disp: , Rfl:  .  gabapentin (NEURONTIN) 300 MG capsule, TAKE 1 CAPSULE BY MOUTH TWICE A DAY, Disp:  180 capsule, Rfl: 1 .  loratadine (CLARITIN) 10 MG tablet, Take 10 mg by mouth daily as needed. , Disp: , Rfl:  .  nystatin (MYCOSTATIN/NYSTOP) powder, APPLY TO AFFECTED AREA 3 TIMES A DAY, Disp: 30 g, Rfl: 1 .  OMEGA-3 FATTY ACIDS PO, , Disp: , Rfl:  .  omeprazole (PRILOSEC) 40 MG capsule, Take 1 capsule (40 mg total) by mouth 2 (two) times daily before a meal., Disp: 60 capsule, Rfl: 0 .  Vitamin D, Ergocalciferol, (DRISDOL) 50000 units CAPS capsule, TAKE ONE CAPSULE BY MOUTH VERY 7 DAYS, Disp: 12 capsule, Rfl: 1 .  omeprazole (PRILOSEC OTC) 20 MG tablet, Take 1 tablet (20 mg total) by mouth 2 (two) times daily., Disp: 60 tablet, Rfl: 0 .  propranolol (INDERAL) 10 MG tablet, Take 1 tablet (10 mg total) by mouth 4 (four) times daily as needed (fast heart rate/palpitations). (Patient not taking: Reported on 06/13/2019), Disp: 60 tablet,  Rfl: 11 .  traZODone (DESYREL) 50 MG tablet, TAKE HALF TO ONE TABLET BY MOUTH AT BEDTIME AS NEEDED FOR SLEEP. (Patient not taking: TAKE HALF TO ONE TABLET BY MOUTH AT BEDTIME AS NEEDED FOR SLEEP.), Disp: 90 tablet, Rfl: 2 .  valACYclovir (VALTREX) 500 MG tablet, Take 1 tablet (500 mg total) by mouth 2 (two) times daily. (Patient not taking: Reported on 05/25/2019), Disp: 14 tablet, Rfl: 3    Family History  Problem Relation Age of Onset  . Hypertension Mother   . Cancer Mother        Laryngeal and lymphoma  . Hypertension Sister   . Heart disease Maternal Grandmother      Social History   Tobacco Use  . Smoking status: Never Smoker  . Smokeless tobacco: Never Used  Substance Use Topics  . Alcohol use: Yes    Comment: 1-2 glasses wine per month  . Drug use: No    Allergies as of 06/27/2019 - Review Complete 06/20/2019  Allergen Reaction Noted  . Doxycycline Itching and Rash 03/17/2017  . Tamiflu [oseltamivir phosphate] Rash 08/21/2018    Review of Systems:    All systems reviewed and negative except where noted in HPI.   Physical Exam:  BP  113/74   Pulse 80   Temp 98.3 F (36.8 C) (Temporal)   Ht 5\' 5"  (1.651 m)   Wt 213 lb 3.2 oz (96.7 kg)   BMI 35.48 kg/m  No LMP recorded.  General:   Alert,  Well-developed, well-nourished, pleasant and cooperative in NAD Head:  Normocephalic and atraumatic. Eyes:  Sclera clear, no icterus.   Conjunctiva pink. Ears:  Normal auditory acuity. Nose:  No deformity, discharge, or lesions. Mouth:  No deformity or lesions,oropharynx pink & moist. Neck:  Supple; no masses or thyromegaly. Lungs:  Respirations even and unlabored.  Clear throughout to auscultation.   No wheezes, crackles, or rhonchi. No acute distress. Heart:  Regular rate and rhythm; no murmurs, clicks, rubs, or gallops. Abdomen:  Normal bowel sounds. Soft, non-tender and non-distended without masses, hepatosplenomegaly or hernias noted.  No guarding or rebound tenderness.   Rectal: Not performed Msk:  Symmetrical without gross deformities. Good, equal movement & strength bilaterally. Pulses:  Normal pulses noted. Extremities:  No clubbing or edema.  No cyanosis. Neurologic:  Alert and oriented x3;  grossly normal neurologically. Skin:  Intact without significant lesions or rashes. No jaundice. Psych:  Alert and cooperative. Normal mood and affect.  Imaging Studies: Reviewed  Assessment and Plan:   Virginia Woods is a 48 y.o. female with history of Arnold-Chiari malformation seen for follow-up of approximately 2 months history of dysphagia, abnormal barium esophagram with possible lesion in the cervical esophagus as well as chronic iron deficiency anemia  Dysphagia EGD did not reveal any structural lesions at the mucosal level Reviewed barium esophagogram with the radiologist and recommended CT soft tissue neck with contrast to evaluate for any submucosal lesions in the cervical esophagus Continue omeprazole to 20 mg twice daily Continue antireflux lifestyle  Chronic iron deficiency anemia EGD and colonoscopy  were unremarkable Already scheduled for video capsule endoscopy  Follow up after the above work-up   Cephas Darby, MD

## 2019-06-28 ENCOUNTER — Other Ambulatory Visit: Payer: Self-pay

## 2019-06-28 ENCOUNTER — Ambulatory Visit (INDEPENDENT_AMBULATORY_CARE_PROVIDER_SITE_OTHER): Payer: Federal, State, Local not specified - PPO | Admitting: Internal Medicine

## 2019-06-28 ENCOUNTER — Encounter: Payer: Self-pay | Admitting: Internal Medicine

## 2019-06-28 VITALS — BP 138/86 | HR 86 | Ht 65.0 in | Wt 210.0 lb

## 2019-06-28 DIAGNOSIS — I471 Supraventricular tachycardia: Secondary | ICD-10-CM

## 2019-06-28 NOTE — Progress Notes (Signed)
HPI Virginia Woods is referred today by Dr. Acie Fredrickson for evaluation of SVT. She is a pleasant 48 yo woman with a h/o SVT dating back several years. Her daughter has a h/o WPW and is s/p ablation. She has had multiple episodes requring visits to the hospital where she will have a narrow QRS tachy at 160/min terminated with IV adenosine. The patient has sob and dizziness but has never passed out. She has taken propranolol prn. She denies ETOH or caffeine excess.  Allergies  Allergen Reactions  . Doxycycline Itching and Rash  . Tamiflu [Oseltamivir Phosphate] Rash     Current Outpatient Medications  Medication Sig Dispense Refill  . albuterol (PROVENTIL HFA;VENTOLIN HFA) 108 (90 Base) MCG/ACT inhaler Inhale 2 puffs into the lungs every 6 (six) hours as needed for wheezing or shortness of breath. 1 Inhaler 2  . ALPRAZolam (XANAX) 0.5 MG tablet Take 1 tablet (0.5 mg total) by mouth 2 (two) times daily as needed. 180 tablet 1  . Ascorbic Acid (VITAMIN C PO) Take by mouth.    . citalopram (CELEXA) 40 MG tablet TAKE 1 TABLET BY MOUTH EVERY DAY 90 tablet 1  . Cyanocobalamin (VITAMIN B 12 PO) Take by mouth.    . cyclobenzaprine (FLEXERIL) 10 MG tablet Take 1 tablet (10 mg total) by mouth 3 (three) times daily as needed for muscle spasms. 270 tablet 1  . diphenhydrAMINE (BENADRYL) 25 MG tablet Take 25 mg by mouth at bedtime as needed for sleep.    Marland Kitchen gabapentin (NEURONTIN) 300 MG capsule TAKE 1 CAPSULE BY MOUTH TWICE A DAY 180 capsule 1  . loratadine (CLARITIN) 10 MG tablet Take 10 mg by mouth daily as needed.     . nystatin (MYCOSTATIN/NYSTOP) powder APPLY TO AFFECTED AREA 3 TIMES A DAY 30 g 1  . OMEGA-3 FATTY ACIDS PO     . omeprazole (PRILOSEC) 20 MG capsule Take 20 mg by mouth 2 (two) times daily.    Marland Kitchen omeprazole (PRILOSEC) 40 MG capsule Take 1 capsule (40 mg total) by mouth 2 (two) times daily before a meal. 60 capsule 0  . propranolol (INDERAL) 10 MG tablet Take 1 tablet (10 mg total) by mouth  4 (four) times daily as needed (fast heart rate/palpitations). 60 tablet 11  . traZODone (DESYREL) 50 MG tablet TAKE HALF TO ONE TABLET BY MOUTH AT BEDTIME AS NEEDED FOR SLEEP. 90 tablet 2  . valACYclovir (VALTREX) 500 MG tablet Take 1 tablet (500 mg total) by mouth 2 (two) times daily. 14 tablet 3  . Vitamin D, Ergocalciferol, (DRISDOL) 50000 units CAPS capsule TAKE ONE CAPSULE BY MOUTH VERY 7 DAYS 12 capsule 1  . omeprazole (PRILOSEC OTC) 20 MG tablet Take 1 tablet (20 mg total) by mouth 2 (two) times daily. 60 tablet 0   No current facility-administered medications for this visit.      Past Medical History:  Diagnosis Date  . Anemia   . Anxiety   . Complication of anesthesia    pt reports some complication after 2nd brain surgery, but unsure of what it was.  . Dental bridge present    Perrmanent dental retainer - bottom  . Depression   . Difficult intubation    with 2nd chiari surgery (per pt)  . Family history of adverse reaction to anesthesia    reports mother had a "negative reaction" to "anesthesia or lidocaine"  . Headache   . History of Chiari malformation 2011   2 surgeries 2011 -  [facility]  . Vitamin B12 deficiency   . Vitamin D deficiency   . Wears contact lenses     ROS:   All systems reviewed and negative except as noted in the HPI.   Past Surgical History:  Procedure Laterality Date  . BRAIN SURGERY  2011   2 surgeries for chiari malformation - [facility]  . CESAREAN SECTION  1996  . COLONOSCOPY  2014  . COLONOSCOPY WITH PROPOFOL N/A 06/20/2019   Procedure: COLONOSCOPY WITH PROPOFOL;  Surgeon: Lin Landsman, MD;  Location: [facility];  Service: Endoscopy;  Laterality: N/A;  . ESOPHAGOGASTRODUODENOSCOPY (EGD) WITH PROPOFOL N/A 06/20/2019   Procedure: ESOPHAGOGASTRODUODENOSCOPY (EGD) WITH PROPOFOL;  Surgeon: Lin Landsman, MD;  Location: [facility];  Service: Endoscopy;  Laterality: N/A;  . TUBAL LIGATION  2002     Family History  Problem  Relation Age of Onset  . Hypertension Mother   . Cancer Mother        Laryngeal and lymphoma  . Hypertension Sister   . Heart disease Maternal Grandmother      Social History   Socioeconomic History  . Marital status: Married    Spouse name: Not on file  . Number of children: Not on file  . Years of education: Not on file  . Highest education level: Not on file  Occupational History  . Not on file  Social Needs  . Financial resource strain: Not on file  . Food insecurity    Worry: Not on file    Inability: Not on file  . Transportation needs    Medical: Not on file    Non-medical: Not on file  Tobacco Use  . Smoking status: Never Smoker  . Smokeless tobacco: Never Used  Substance and Sexual Activity  . Alcohol use: Yes    Comment: 1-2 glasses wine per month  . Drug use: No  . Sexual activity: Not on file  Lifestyle  . Physical activity    Days per week: Not on file    Minutes per session: Not on file  . Stress: Not on file  Relationships  . Social Herbalist on phone: Not on file    Gets together: Not on file    Attends religious service: Not on file    Active member of club or organization: Not on file    Attends meetings of clubs or organizations: Not on file    Relationship status: Not on file  . Intimate partner violence    Fear of current or ex partner: Not on file    Emotionally abused: Not on file    Physically abused: Not on file    Forced sexual activity: Not on file  Other Topics Concern  . Not on file  Social History Narrative  . Not on file     BP 138/86   Pulse 86   Ht 5\' 5"  (1.651 m)   Wt 210 lb (95.3 kg)   SpO2 99%   BMI 34.95 kg/m   Physical Exam:  Well appearing NAD HEENT: Unremarkable Neck:  6 cm JVD, no thyromegally Lymphatics:  No adenopathy Back:  No CVA tenderness Lungs:  Clear with no wheezes HEART:  Regular rate rhythm, no murmurs, no rubs, no clicks Abd:  soft, positive bowel sounds, no organomegally, no  rebound, no guarding Ext:  2 plus pulses, no edema, no cyanosis, no clubbing Skin:  No rashes no nodules Neuro:  CN II through XII intact, motor grossly  intact  EKG - nsr with no pre-excitation  Assess/Plan: 1. SVT - I have discussed the treatment options in detail with the patient. The risks/benefits/goals/expectations of EP study and ablation were reviewed and she will call us if she would like to proceed with ablation.  Mikle Bosworth.D.

## 2019-06-28 NOTE — Patient Instructions (Addendum)
Medication Instructions:  Your physician recommends that you continue on your current medications as directed. Please refer to the Current Medication list given to you today.  Labwork: None ordered.  Testing/Procedures: None ordered.  Follow-Up:  The following dates are available for procedure:  October:  21, 26, 27  November: 5, 9, 16, 20, 23  Any Other Special Instructions Will Be Listed Below (If Applicable).  If you need a refill on your cardiac medications before your next appointment, please call your pharmacy.     Cardiac Ablation Cardiac ablation is a procedure to disable (ablate) a small amount of heart tissue in very specific places. The heart has many electrical connections. Sometimes these connections are abnormal and can cause the heart to beat very fast or irregularly. Ablating some of the problem areas can improve the heart rhythm or return it to normal. Ablation may be done for people who:  Have Wolff-Parkinson-White syndrome.  Have fast heart rhythms (tachycardia).  Have taken medicines for an abnormal heart rhythm (arrhythmia) that were not effective or caused side effects.  Have a high-risk heartbeat that may be life-threatening. During the procedure, a small incision is made in the neck or the groin, and a long, thin, flexible tube (catheter) is inserted into the incision and moved to the heart. Small devices (electrodes) on the tip of the catheter will send out electrical currents. A type of X-ray (fluoroscopy) will be used to help guide the catheter and to provide images of the heart. Tell a health care provider about:  Any allergies you have.  All medicines you are taking, including vitamins, herbs, eye drops, creams, and over-the-counter medicines.  Any problems you or family members have had with anesthetic medicines.  Any blood disorders you have.  Any surgeries you have had.  Any medical conditions you have, such as kidney failure.  Whether  you are pregnant or may be pregnant. What are the risks? Generally, this is a safe procedure. However, problems may occur, including:  Infection.  Bruising and bleeding at the catheter insertion site.  Bleeding into the chest, especially into the sac that surrounds the heart. This is a serious complication.  Stroke or blood clots.  Damage to other structures or organs.  Allergic reaction to medicines or dyes.  Need for a permanent pacemaker if the normal electrical system is damaged. A pacemaker is a small computer that sends electrical signals to the heart and helps your heart beat normally.  The procedure not being fully effective. This may not be recognized until months later. Repeat ablation procedures are sometimes required. What happens before the procedure?  Follow instructions from your health care provider about eating or drinking restrictions.  Ask your health care provider about: ? Changing or stopping your regular medicines. This is especially important if you are taking diabetes medicines or blood thinners. ? Taking medicines such as aspirin and ibuprofen. These medicines can thin your blood. Do not take these medicines before your procedure if your health care provider instructs you not to.  Plan to have someone take you home from the hospital or clinic.  If you will be going home right after the procedure, plan to have someone with you for 24 hours. What happens during the procedure?  To lower your risk of infection: ? Your health care team will wash or sanitize their hands. ? Your skin will be washed with soap. ? Hair may be removed from the incision area.  An IV tube will be inserted into  one of your veins.  You will be given a medicine to help you relax (sedative).  The skin on your neck or groin will be numbed.  An incision will be made in your neck or your groin.  A needle will be inserted through the incision and into a large vein in your neck or  groin.  A catheter will be inserted into the needle and moved to your heart.  Dye may be injected through the catheter to help your surgeon see the area of the heart that needs treatment.  Electrical currents will be sent from the catheter to ablate heart tissue in desired areas. There are three types of energy that may be used to ablate heart tissue: ? Heat (radiofrequency energy). ? Laser energy. ? Extreme cold (cryoablation).  When the necessary tissue has been ablated, the catheter will be removed.  Pressure will be held on the catheter insertion area to prevent excessive bleeding.  A bandage (dressing) will be placed over the catheter insertion area. The procedure may vary among health care providers and hospitals. What happens after the procedure?  Your blood pressure, heart rate, breathing rate, and blood oxygen level will be monitored until the medicines you were given have worn off.  Your catheter insertion area will be monitored for bleeding. You will need to lie still for a few hours to ensure that you do not bleed from the catheter insertion area.  Do not drive for 24 hours or as long as directed by your health care provider. Summary  Cardiac ablation is a procedure to disable (ablate) a small amount of heart tissue in very specific places. Ablating some of the problem areas can improve the heart rhythm or return it to normal.  During the procedure, electrical currents will be sent from the catheter to ablate heart tissue in desired areas. This information is not intended to replace advice given to you by your health care provider. Make sure you discuss any questions you have with your health care provider. Document Released: 01/23/2009 Document Revised: 02/27/2018 Document Reviewed: 07/26/2016 Elsevier Patient Education  2020 [employer].

## 2019-07-02 ENCOUNTER — Telehealth: Payer: Self-pay | Admitting: *Deleted

## 2019-07-02 ENCOUNTER — Telehealth: Payer: Self-pay

## 2019-07-02 NOTE — Telephone Encounter (Signed)
Noted  

## 2019-07-02 NOTE — Telephone Encounter (Signed)
Patient states that last time she had a covid test she had her procedure on Wednesday and they called her and told her to come for her COVID test on Monday. Explained to patient we have a set time when you need to go for your covid test for procedure and if your procedure is Wednesday then your COVID test is Friday. Patient states they will call her and tell her Monday I informed patient again that the test would be Friday. Patient states she does not want to got to [facility] for her COVID test she wants to go to [facility]. She gave me the number to call to tell them she was coming there. There number is [phone]. Patient states last time the nurse said to stay at home till her procedure. Informed patient we did not write our patient out of work for procedures. We just ask to use good hand hygiene wear mask and gloves. Patient states if the nurse says not to go to work she will not go. Informed patient we could not write a work note for this. Patient states that is okay. Called the testing center in  and left a detail message that patient was coming there for her pre procedure covid test on 07/06/19.

## 2019-07-02 NOTE — Telephone Encounter (Signed)
Patient called requesting to speak to Jenni's CMA. 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 Patient just wants to give Tawanna Sat a heads up that she is going to discuss with her HR about continuing to work at home for now, due to her health problems.

## 2019-07-02 NOTE — Telephone Encounter (Signed)
The covid testing center called and informed me that this patient does not have to get COVID tested for the given capsule study. Called patient and patient verbalized understanding

## 2019-07-03 ENCOUNTER — Other Ambulatory Visit: Payer: Self-pay | Admitting: Gastroenterology

## 2019-07-03 DIAGNOSIS — R1319 Other dysphagia: Secondary | ICD-10-CM

## 2019-07-04 ENCOUNTER — Ambulatory Visit
Admission: RE | Admit: 2019-07-04 | Discharge: 2019-07-04 | Disposition: A | Discharge status: Home / Self Care | Payer: Federal, State, Local not specified - PPO | Source: Ambulatory Visit | Attending: Gastroenterology | Admitting: Gastroenterology

## 2019-07-04 ENCOUNTER — Other Ambulatory Visit: Payer: Self-pay

## 2019-07-04 ENCOUNTER — Other Ambulatory Visit: Payer: Self-pay | Admitting: Physician Assistant

## 2019-07-04 DIAGNOSIS — E559 Vitamin D deficiency, unspecified: Secondary | ICD-10-CM

## 2019-07-04 DIAGNOSIS — R221 Localized swelling, mass and lump, neck: Secondary | ICD-10-CM | POA: Diagnosis not present

## 2019-07-04 MED ORDER — IOHEXOL 300 MG/ML  SOLN
75.0000 mL | Freq: Once | INTRAMUSCULAR | Status: AC | PRN
  Administered 2019-07-04: 75 mL via INTRAVENOUS

## 2019-07-04 NOTE — Telephone Encounter (Signed)
Does she need the RX one. According to the labs she was to continue OTC Vit D.

## 2019-07-05 ENCOUNTER — Other Ambulatory Visit: Payer: Self-pay | Admitting: Gastroenterology

## 2019-07-05 DIAGNOSIS — R1319 Other dysphagia: Secondary | ICD-10-CM

## 2019-07-09 ENCOUNTER — Other Ambulatory Visit: Payer: Self-pay

## 2019-07-09 ENCOUNTER — Ambulatory Visit (INDEPENDENT_AMBULATORY_CARE_PROVIDER_SITE_OTHER): Payer: Federal, State, Local not specified - PPO | Admitting: Physician Assistant

## 2019-07-09 ENCOUNTER — Encounter: Payer: Self-pay | Admitting: Physician Assistant

## 2019-07-09 VITALS — Temp 99.0°F

## 2019-07-09 DIAGNOSIS — Z20822 Contact with and (suspected) exposure to covid-19: Secondary | ICD-10-CM

## 2019-07-09 DIAGNOSIS — Z20828 Contact with and (suspected) exposure to other viral communicable diseases: Secondary | ICD-10-CM | POA: Diagnosis not present

## 2019-07-09 DIAGNOSIS — R509 Fever, unspecified: Secondary | ICD-10-CM | POA: Diagnosis not present

## 2019-07-09 DIAGNOSIS — R5383 Other fatigue: Secondary | ICD-10-CM

## 2019-07-09 DIAGNOSIS — R0981 Nasal congestion: Secondary | ICD-10-CM

## 2019-07-09 NOTE — Patient Instructions (Signed)
Can take to lessen severity: Vit C 500mg  twice daily Quercertin 250-500mg  twice daily Zinc 75-100mg  daily Melatonin 3-6 mg at bedtime Vit D3 1000-2000 IU daily Aspirin 81 mg daily with food Optional: Famotidine 20mg  daily Also can add tylenol/ibuprofen as needed for fevers and body aches May add Mucinex or Mucinex DM as needed for cough/congestion     Person Under Monitoring Name: Virginia Woods  Location: [address] [place]   Infection Prevention Recommendations for Individuals Confirmed to have, or Being Evaluated for, 2019 Novel Coronavirus (COVID-19) Infection Who Receive Care at Home  Individuals who are confirmed to have, or are being evaluated for, COVID-19 should follow the prevention steps below until a healthcare provider or local or state health department says they can return to normal activities.  Stay home except to get medical care You should restrict activities outside your home, except for getting medical care. Do not go to work, school, or public areas, and do not use public transportation or taxis.  Call ahead before visiting your doctor Before your medical appointment, call the healthcare provider and tell them that you have, or are being evaluated for, COVID-19 infection. This will help the healthcare provider's office take steps to keep other people from getting infected. Ask your healthcare provider to call the local or state health department.  Monitor your symptoms Seek prompt medical attention if your illness is worsening (e.g., difficulty breathing). Before going to your medical appointment, call the healthcare provider and tell them that you have, or are being evaluated for, COVID-19 infection. Ask your healthcare provider to call the local or state health department.  Wear a facemask You should wear a facemask that covers your nose and mouth when you are in the same room with other people and when you visit a healthcare  provider. People who live with or visit you should also wear a facemask while they are in the same room with you.  Separate yourself from other people in your home As much as possible, you should stay in a different room from other people in your home. Also, you should use a separate bathroom, if available.  Avoid sharing household items You should not share dishes, drinking glasses, cups, eating utensils, towels, bedding, or other items with other people in your home. After using these items, you should wash them thoroughly with soap and water.  Cover your coughs and sneezes Cover your mouth and nose with a tissue when you cough or sneeze, or you can cough or sneeze into your sleeve. Throw used tissues in a lined trash can, and immediately wash your hands with soap and water for at least 20 seconds or use an alcohol-based hand rub.  Wash your 2020 your hands often and thoroughly with soap and water for at least 20 seconds. You can use an alcohol-based hand sanitizer if soap and water are not available and if your hands are not visibly dirty. Avoid touching your eyes, nose, and mouth with unwashed hands.   Prevention Steps for Caregivers and Household Members of Individuals Confirmed to have, or Being Evaluated for, COVID-19 Infection Being Cared for in the Home  If you live with, or provide care at home for, a person confirmed to have, or being evaluated for, COVID-19 infection please follow these guidelines to prevent infection:  Follow healthcare provider's instructions Make sure that you understand and can help the patient follow any healthcare provider instructions for all care.  Provide for the patient's  basic needs You should help the patient with basic needs in the home and provide support for getting groceries, prescriptions, and other personal needs.  Monitor the patient's symptoms If they are getting sicker, call his or her medical provider and tell them that the  patient has, or is being evaluated for, COVID-19 infection. This will help the healthcare provider's office take steps to keep other people from getting infected. Ask the healthcare provider to call the local or state health department.  Limit the number of people who have contact with the patient  If possible, have only one caregiver for the patient.  Other household members should stay in another home or place of residence. If this is not possible, they should stay  in another room, or be separated from the patient as much as possible. Use a separate bathroom, if available.  Restrict visitors who do not have an essential need to be in the home.  Keep older adults, very young children, and other sick people away from the patient Keep older adults, very young children, and those who have compromised immune systems or chronic health conditions away from the patient. This includes people with chronic heart, lung, or kidney conditions, diabetes, and cancer.  Ensure good ventilation Make sure that shared spaces in the home have good air flow, such as from an air conditioner or an opened window, weather permitting.  Wash your hands often  Wash your hands often and thoroughly with soap and water for at least 20 seconds. You can use an alcohol based hand sanitizer if soap and water are not available and if your hands are not visibly dirty.  Avoid touching your eyes, nose, and mouth with unwashed hands.  Use disposable paper towels to dry your hands. If not available, use dedicated cloth towels and replace them when they become wet.  Wear a facemask and gloves  Wear a disposable facemask at all times in the room and gloves when you touch or have contact with the patient's blood, body fluids, and/or secretions or excretions, such as sweat, saliva, sputum, nasal mucus, vomit, urine, or feces.  Ensure the mask fits over your nose and mouth tightly, and do not touch it during use.  Throw out  disposable facemasks and gloves after using them. Do not reuse.  Wash your hands immediately after removing your facemask and gloves.  If your personal clothing becomes contaminated, carefully remove clothing and launder. Wash your hands after handling contaminated clothing.  Place all used disposable facemasks, gloves, and other waste in a lined container before disposing them with other household waste.  Remove gloves and wash your hands immediately after handling these items.  Do not share dishes, glasses, or other household items with the patient  Avoid sharing household items. You should not share dishes, drinking glasses, cups, eating utensils, towels, bedding, or other items with a patient who is confirmed to have, or being evaluated for, COVID-19 infection.  After the person uses these items, you should wash them thoroughly with soap and water.  Wash laundry thoroughly  Immediately remove and wash clothes or bedding that have blood, body fluids, and/or secretions or excretions, such as sweat, saliva, sputum, nasal mucus, vomit, urine, or feces, on them.  Wear gloves when handling laundry from the patient.  Read and follow directions on labels of laundry or clothing items and detergent. In general, wash and dry with the warmest temperatures recommended on the label.  Clean all areas the individual has used often  Clean all touchable surfaces, such as counters, tabletops, doorknobs, bathroom fixtures, toilets, phones, keyboards, tablets, and bedside tables, every day. Also, clean any surfaces that may have blood, body fluids, and/or secretions or excretions on them.  Wear gloves when cleaning surfaces the patient has come in contact with.  Use a diluted bleach solution (e.g., dilute bleach with 1 part bleach and 10 parts water) or a household disinfectant with a label that says EPA-registered for coronaviruses. To make a bleach solution at home, add 1 tablespoon of bleach to 1  quart (4 cups) of water. For a larger supply, add  cup of bleach to 1 gallon (16 cups) of water.  Read labels of cleaning products and follow recommendations provided on product labels. Labels contain instructions for safe and effective use of the cleaning product including precautions you should take when applying the product, such as wearing gloves or eye protection and making sure you have good ventilation during use of the product.  Remove gloves and wash hands immediately after cleaning.  Monitor yourself for signs and symptoms of illness Caregivers and household members are considered close contacts, should monitor their health, and will be asked to limit movement outside of the home to the extent possible. Follow the monitoring steps for close contacts listed on the symptom monitoring form.   ? If you have additional questions, contact your local health department or call the epidemiologist on call at [phone] (available 24/7). ? This guidance is subject to change. For the most up-to-date guidance from [facility], please refer to their website: [email or web address]

## 2019-07-09 NOTE — Progress Notes (Signed)
Virtual Visit via Telephone Note  I connected with Virginia Woods on 07/09/19 at 10:40 AM EDT by telephone and verified that I am speaking with the correct person using two identifiers.  Location: Patient: home Provider: [facility]   I discussed the limitations, risks, security and privacy concerns of performing an evaluation and management service by telephone and the availability of in person appointments. I also discussed with the patient that there may be a patient responsible charge related to this service. The patient expressed understanding and agreed to proceed.   Mar Daring, PA-C   Patient: Virginia Woods Female    DOB: 30-Dec-1970   48 y.o.   MRN: [id number] Visit Date: 07/09/2019  Today's Provider: Mar Daring, PA-C   No chief complaint on file.  Subjective:     HPI  Patient with c/o not feeling good. Symptoms started yesterday. She has low grade fever 99 this morning, nasal congestion, chest tightness, headache yesterday, feels fatigued. No sore throat, no body ache or chills. She has not taken anything this morning. Grandchildren were over this past weekend and her granddaughter had a runny nose and cough. Has had doctor's appointments last week, but always wears a mask.    Allergies  Allergen Reactions  . Doxycycline Itching and Rash  . Tamiflu [Oseltamivir Phosphate] Rash     Current Outpatient Medications:  .  albuterol (PROVENTIL HFA;VENTOLIN HFA) 108 (90 Base) MCG/ACT inhaler, Inhale 2 puffs into the lungs every 6 (six) hours as needed for wheezing or shortness of breath., Disp: 1 Inhaler, Rfl: 2 .  ALPRAZolam (XANAX) 0.5 MG tablet, Take 1 tablet (0.5 mg total) by mouth 2 (two) times daily as needed., Disp: 180 tablet, Rfl: 1 .  citalopram (CELEXA) 40 MG tablet, TAKE 1 TABLET BY MOUTH EVERY DAY, Disp: 90 tablet, Rfl: 1 .  cyclobenzaprine (FLEXERIL) 10 MG tablet, Take 1 tablet (10 mg total) by mouth 3 (three) times daily as needed for  muscle spasms., Disp: 270 tablet, Rfl: 1 .  gabapentin (NEURONTIN) 300 MG capsule, TAKE 1 CAPSULE BY MOUTH TWICE A DAY, Disp: 180 capsule, Rfl: 1 .  omeprazole (PRILOSEC) 40 MG capsule, TAKE 1 CAPSULE (40 MG TOTAL) BY MOUTH 2 (TWO) TIMES DAILY BEFORE A MEAL., Disp: 60 capsule, Rfl: 0 .  Vitamin D, Ergocalciferol, (DRISDOL) 1.25 MG (50000 UT) CAPS capsule, TAKE ONE CAPSULE BY MOUTH EVERY 7 DAYS, Disp: 12 capsule, Rfl: 1 .  Ascorbic Acid (VITAMIN C PO), Take by mouth., Disp: , Rfl:  .  Cyanocobalamin (VITAMIN B 12 PO), Take by mouth., Disp: , Rfl:  .  diphenhydrAMINE (BENADRYL) 25 MG tablet, Take 25 mg by mouth at bedtime as needed for sleep., Disp: , Rfl:  .  loratadine (CLARITIN) 10 MG tablet, Take 10 mg by mouth daily as needed. , Disp: , Rfl:  .  nystatin (MYCOSTATIN/NYSTOP) powder, APPLY TO AFFECTED AREA 3 TIMES A DAY (Patient not taking: Reported on 07/09/2019), Disp: 30 g, Rfl: 1 .  OMEGA-3 FATTY ACIDS PO, , Disp: , Rfl:  .  propranolol (INDERAL) 10 MG tablet, Take 1 tablet (10 mg total) by mouth 4 (four) times daily as needed (fast heart rate/palpitations). (Patient not taking: Reported on 07/09/2019), Disp: 60 tablet, Rfl: 11 .  traZODone (DESYREL) 50 MG tablet, TAKE HALF TO ONE TABLET BY MOUTH AT BEDTIME AS NEEDED FOR SLEEP. (Patient not taking: Reported on 07/09/2019), Disp: 90 tablet, Rfl: 2  Review of Systems  Constitutional: Positive for fatigue  and fever (low grade, less than 100). Negative for chills.  HENT: Positive for congestion, postnasal drip and rhinorrhea. Negative for ear pain, sinus pressure, sinus pain, sore throat and trouble swallowing.   Respiratory: Positive for chest tightness. Negative for cough, shortness of breath and wheezing.   Cardiovascular: Negative for chest pain, palpitations and leg swelling.  Gastrointestinal: Negative for abdominal pain, diarrhea and nausea.  Neurological: Positive for headaches. Negative for dizziness.    Social History   Tobacco Use   . Smoking status: Never Smoker  . Smokeless tobacco: Never Used  Substance Use Topics  . Alcohol use: Yes    Comment: 1-2 glasses wine per month      Objective:   Temp 99 F (37.2 C) (Oral)  Vitals:   07/09/19 1015  Temp: 99 F (37.2 C)  TempSrc: Oral  There is no height or weight on file to calculate BMI.   Physical Exam Vitals signs reviewed.  Constitutional:      General: She is not in acute distress. Pulmonary:     Effort: Pulmonary effort is normal. No respiratory distress.  Neurological:     Mental Status: She is alert.      No results found for any visits on 07/09/19.     Assessment & Plan    1. Suspected COVID-19 virus infection Will send for covid testing. Advised of isolation guidelines. Information sent through mychart about isolation, symptomatic management and note for employer. Will f/u pending results. Enrolled in covid 19 symptom monitoring program. Call if symptoms worsen. - Novel Coronavirus, NAA ([facility]) - MyChart COVID-19 home monitoring program; Future - Temperature monitoring; Future  2. Low grade fever See above medical treatment plan. - Novel Coronavirus, NAA ([facility]) - MyChart COVID-19 home monitoring program; Future - Temperature monitoring; Future  3. Nasal congestion See above medical treatment plan. - Novel Coronavirus, NAA ([facility]) - MyChart COVID-19 home monitoring program; Future - Temperature monitoring; Future  4. Fatigue, unspecified type See above medical treatment plan. - Novel Coronavirus, NAA ([facility]) - MyChart COVID-19 home monitoring program; Future - Temperature monitoring; Future    I discussed the assessment and treatment plan with the patient. The patient was provided an opportunity to ask questions and all were answered. The patient agreed with the plan and demonstrated an understanding of the instructions.   The patient was advised to call back or seek an in-person evaluation if the symptoms worsen or  if the condition fails to improve as anticipated.  I provided 20 minutes of non-face-to-face time during this encounter.   Mar Daring, PA-C  [facility]

## 2019-07-10 ENCOUNTER — Other Ambulatory Visit: Payer: Self-pay | Admitting: Physician Assistant

## 2019-07-10 DIAGNOSIS — Q0700 Arnold-Chiari syndrome without spina bifida or hydrocephalus: Secondary | ICD-10-CM

## 2019-07-10 DIAGNOSIS — M5413 Radiculopathy, cervicothoracic region: Secondary | ICD-10-CM

## 2019-07-11 ENCOUNTER — Telehealth: Payer: Self-pay

## 2019-07-11 ENCOUNTER — Encounter: Admission: RE | Payer: Self-pay | Source: Home / Self Care

## 2019-07-11 ENCOUNTER — Telehealth: Payer: Self-pay | Admitting: Physician Assistant

## 2019-07-11 ENCOUNTER — Ambulatory Visit
Admission: RE | Admit: 2019-07-11 | Payer: Federal, State, Local not specified - PPO | Source: Home / Self Care | Admitting: Gastroenterology

## 2019-07-11 ENCOUNTER — Encounter: Payer: Self-pay | Admitting: Physician Assistant

## 2019-07-11 DIAGNOSIS — F411 Generalized anxiety disorder: Secondary | ICD-10-CM | POA: Diagnosis not present

## 2019-07-11 LAB — NOVEL CORONAVIRUS, NAA: SARS-CoV-2, NAA: NOT DETECTED

## 2019-07-11 SURGERY — IMAGING PROCEDURE, GI TRACT, INTRALUMINAL, VIA CAPSULE

## 2019-07-11 NOTE — Telephone Encounter (Signed)
Pt called needing a note from [employer] stating she isn't feeling good and had to be out yesterday and today.  Pt asking if it could be put in her MyChart to give to her employer.  Thanks, [employer]

## 2019-07-11 NOTE — Telephone Encounter (Signed)
Patient advised.

## 2019-07-11 NOTE — Telephone Encounter (Signed)
Letter sent.

## 2019-07-11 NOTE — Telephone Encounter (Signed)
-----   Message from Mar Daring, PA-C sent at 07/11/2019  8:39 AM EDT ----- Covid testing is negative.

## 2019-07-18 ENCOUNTER — Encounter: Payer: Self-pay | Admitting: Cardiovascular Disease

## 2019-07-18 ENCOUNTER — Encounter (HOSPITAL_COMMUNITY): Payer: Self-pay | Admitting: *Deleted

## 2019-07-18 ENCOUNTER — Telehealth (HOSPITAL_COMMUNITY): Payer: Self-pay | Admitting: *Deleted

## 2019-07-18 ENCOUNTER — Other Ambulatory Visit: Payer: Self-pay

## 2019-07-18 ENCOUNTER — Telehealth (INDEPENDENT_AMBULATORY_CARE_PROVIDER_SITE_OTHER): Payer: Federal, State, Local not specified - PPO | Admitting: Cardiovascular Disease

## 2019-07-18 VITALS — Ht 65.0 in | Wt 205.0 lb

## 2019-07-18 DIAGNOSIS — I471 Supraventricular tachycardia, unspecified: Secondary | ICD-10-CM

## 2019-07-18 DIAGNOSIS — R079 Chest pain, unspecified: Secondary | ICD-10-CM

## 2019-07-18 MED ORDER — PROPRANOLOL HCL 10 MG PO TABS: 10.0000 mg | ORAL_TABLET | Freq: Four times a day (QID) | ORAL | Status: DC | PRN

## 2019-07-18 NOTE — Patient Instructions (Addendum)
Medication Instructions:  Your physician has recommended you make the following change in your medication:  1-Take propranolol 10 mg by mouth 4 times a day as needed for palpitations.  *If you need a refill on your cardiac medications before your next appointment, please call your pharmacy*  Lab Work:  If you have labs (blood work) drawn today and your tests are completely normal, you will receive your results only by: Marland Kitchen MyChart Message (if you have MyChart) OR . A paper copy in the mail If you have any lab test that is abnormal or we need to change your treatment, we will call you to review the results.  Testing/Procedures: Your physician has requested that you have a lexiscan myoview. For further information please visit [email or web address]. Please follow instruction sheet, as given.  Follow-Up: At [facility], you and your health needs are our priority.  As part of our continuing mission to provide you with exceptional heart care, we have created designated Provider Care Teams.  These Care Teams include your primary Cardiologist (physician) and Advanced Practice Providers (APPs -  Physician Assistants and Nurse Practitioners) who all work together to provide you with the care you need, when you need it.  Your next appointment:   3 months- please call to schedule this appointment  The format for your next appointment:   In Person  Provider:   You may see Mertie Moores, MD or one of the following Advanced Practice Providers on your designated Care Team:    Richardson Dopp, PA-C  [place], [place]  Daune Perch, [place]

## 2019-07-18 NOTE — Telephone Encounter (Signed)
Patient could not talk when called but gave permission to leave on cell #.Left message on voicemail per DPR in reference to upcoming appointment scheduled on 07/23/2019 at 77 with detailed instructions given per Myocardial Perfusion Study Information Sheet for the test. LM to arrive 15 minutes early, and that it is imperative to arrive on time for appointment to keep from having the test rescheduled. If you need to cancel or reschedule your appointment, please call the office within 24 hours of your appointment. Failure to do so may result in a cancellation of your appointment, and a $50 no show fee. Phone number given for call back for any questions.   My chart letter sent.Aleathia Purdy, [place]e [place]

## 2019-07-18 NOTE — Progress Notes (Signed)
Virtual Visit via telephone  Note   This visit type was conducted due to national recommendations for restrictions regarding the COVID-19 Pandemic (e.g. social distancing) in an effort to limit this patient's exposure and mitigate transmission in our community.  Due to her co-morbid illnesses, this patient is at least at moderate risk for complications without adequate follow up.  This format is felt to be most appropriate for this patient at this time.  All issues noted in this document were discussed and addressed.  A limited physical exam was performed with this format.  Please refer to the patient's chart for her consent to telehealth for [facility].   Date:  07/18/2019   ID:  Virginia Woods, DOB 10-Jan-1971, MRN [id number]  Patient Location: Home Provider Location: Office  PCP:  Mar Daring, PA-C  Cardiologist:  Mertie Moores, MD  Electrophysiologist:  None   Evaluation Performed:  Follow-Up Visit  Chief Complaint:   Palpitations,  SVT   History of Present Illness:    Virginia Woods is a 48 y.o. female with hx of SVT .  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    We referred her to Dr. Lovena Le for further evaluation. They discussed SVT ablation. She is still thinking about the procedure.  She will call and schedule with Dr. Lovena Le when she is ready. Still has some episodes of SVT / palpitations  Some of her symptoms may be PVCs She thinks symptoms may be related to stress. She is under lots of stress ( marriage, school teacher, home)  Has propranolol but she did not take   Is scheduled for a capsule endoscopy.     The patient does not have symptoms concerning for COVID-19 infection (fever, chills, cough, or new shortness of breath).    Past Medical History:  Diagnosis Date  . Anemia   . Anxiety   . Complication of anesthesia    pt reports some complication after 2nd brain surgery, but unsure of what it was.  . Dental bridge present    Perrmanent dental  retainer - bottom  . Depression   . Difficult intubation    with 2nd chiari surgery (per pt)  . Family history of adverse reaction to anesthesia    reports mother had a "negative reaction" to "anesthesia or lidocaine"  . Headache   . History of Chiari malformation 2011   2 surgeries 2011 [facility]  . Vitamin B12 deficiency   . Vitamin D deficiency   . Wears contact lenses    Past Surgical History:  Procedure Laterality Date  . BRAIN SURGERY  2011   2 surgeries for chiari malformation - [facility]  . CESAREAN SECTION  1996  . COLONOSCOPY  2014  . COLONOSCOPY WITH PROPOFOL N/A 06/20/2019   Procedure: COLONOSCOPY WITH PROPOFOL;  Surgeon: Lin Landsman, MD;  Location: [facility];  Service: Endoscopy;  Laterality: N/A;  . ESOPHAGOGASTRODUODENOSCOPY (EGD) WITH PROPOFOL N/A 06/20/2019   Procedure: ESOPHAGOGASTRODUODENOSCOPY (EGD) WITH PROPOFOL;  Surgeon: Lin Landsman, MD;  Location: [place];  Service: Endoscopy;  Laterality: N/A;  . TUBAL LIGATION  2002     Current Meds  Medication Sig  . albuterol (PROVENTIL HFA;VENTOLIN HFA) 108 (90 Base) MCG/ACT inhaler Inhale 2 puffs into the lungs every 6 (six) hours as needed for wheezing or shortness of breath.  . ALPRAZolam (XANAX) 0.5 MG tablet Take 1 tablet (0.5 mg total) by mouth 2 (two) times daily as needed.  . Ascorbic Acid (VITAMIN  C PO) Take by mouth.  . citalopram (CELEXA) 40 MG tablet TAKE 1 TABLET BY MOUTH EVERY DAY  . Cyanocobalamin (VITAMIN B 12 PO) Take by mouth.  . diphenhydrAMINE (BENADRYL) 25 MG tablet Take 25 mg by mouth at bedtime as needed for sleep.  Marland Kitchen gabapentin (NEURONTIN) 300 MG capsule TAKE 1 CAPSULE BY MOUTH TWICE A DAY  . loratadine (CLARITIN) 10 MG tablet Take 10 mg by mouth daily as needed.   . OMEGA-3 FATTY ACIDS PO   . omeprazole (PRILOSEC) 40 MG capsule TAKE 1 CAPSULE (40 MG TOTAL) BY MOUTH 2 (TWO) TIMES DAILY BEFORE A MEAL.  Marland Kitchen Vitamin D, Ergocalciferol, (DRISDOL) 1.25 MG (50000 UT) CAPS  capsule TAKE ONE CAPSULE BY MOUTH EVERY 7 DAYS     Allergies:   Doxycycline and Tamiflu [oseltamivir phosphate]   Social History   Tobacco Use  . Smoking status: Never Smoker  . Smokeless tobacco: Never Used  Substance Use Topics  . Alcohol use: Yes    Comment: 1-2 glasses wine per month  . Drug use: No     Family Hx: The patient's family history includes Cancer in her mother; Heart disease in her maternal grandmother; Hypertension in her mother and sister.  ROS:   Please see the history of present illness.     All other systems reviewed and are negative.   Prior CV studies:   The following studies were reviewed today:    Labs/Other Tests and Data Reviewed:    EKG:  No ECG reviewed.  Recent Labs: 03/16/2019: ALT 13; Hemoglobin 12.4; Platelets 316; TSH 1.190 04/10/2019: BUN 10; Creatinine, Ser 0.77; Potassium 4.2; Sodium 141   Recent Lipid Panel Lab Results  Component Value Date/Time   CHOL 132 06/28/2018 04:06 PM   TRIG 91 06/28/2018 04:06 PM   HDL 51 06/28/2018 04:06 PM   CHOLHDL 2.6 06/28/2018 04:06 PM   LDLCALC 63 06/28/2018 04:06 PM    Wt Readings from Last 3 Encounters:  07/18/19 205 lb (93 kg)  06/28/19 210 lb (95.3 kg)  06/27/19 213 lb 3.2 oz (96.7 kg)     Objective:    Vital Signs:  Ht 5\' 5"  (1.651 m)   Wt 205 lb (93 kg)   BMI 34.11 kg/m      ASSESSMENT & PLAN:    1. SVT : She continues to have palpitations.  She has some propranolol which have instructed her to take for episodes of SVT and also for her palpitations which are more likely due to premature ventricular contractions.  She has been seen by Dr. Lovena Le.  They have discussed ablation.  She may want to consider ablation at some point in the future.  Currently she has a lot going on and wants to wait until she is a little bit more stable.  We will see her again in 6 months.  COVID-19 Education: The signs and symptoms of COVID-19 were discussed with the patient and how to seek care  for testing (follow up with PCP or arrange E-visit).  The importance of social distancing was discussed today.  Time:   Today, I have spent  17 minutes with the patient with telehealth technology discussing the above problems.     Medication Adjustments/Labs and Tests Ordered: Current medicines are reviewed at length with the patient today.  Concerns regarding medicines are outlined above.   Tests Ordered: No orders of the defined types were placed in this encounter.   Medication Changes: No orders of the defined types were  placed in this encounter.   Follow Up:  Either In Person or Virtual in 6 month(s)  Signed, Mertie Moores, MD  07/18/2019 8:11 AM    [place]

## 2019-07-23 ENCOUNTER — Encounter (HOSPITAL_COMMUNITY): Payer: Federal, State, Local not specified - PPO

## 2019-07-30 ENCOUNTER — Encounter (HOSPITAL_COMMUNITY): Payer: Self-pay | Admitting: Cardiovascular Disease

## 2019-07-31 DIAGNOSIS — F411 Generalized anxiety disorder: Secondary | ICD-10-CM | POA: Diagnosis not present

## 2019-08-03 ENCOUNTER — Telehealth (HOSPITAL_COMMUNITY): Payer: Self-pay

## 2019-08-03 NOTE — Telephone Encounter (Signed)
New message    Just an FYI. We have made several attempts to contact this patient including sending a letter to schedule or reschedule their echocardiogram. We will be removing the patient from the echo WQ.   11.11.20 @ 3:48pm lm on home vm - Grey Rakestraw  11.9.20 mail reminder letter Sherry Rogus  11.2.20 Cancel Rsn: Patient (patient does not feel good)

## 2019-08-06 ENCOUNTER — Encounter: Payer: Self-pay | Admitting: Physician Assistant

## 2019-08-28 DIAGNOSIS — F411 Generalized anxiety disorder: Secondary | ICD-10-CM | POA: Diagnosis not present

## 2019-09-24 ENCOUNTER — Encounter: Payer: Self-pay | Admitting: Physician Assistant

## 2019-10-18 ENCOUNTER — Encounter: Payer: Self-pay | Admitting: Cardiovascular Disease

## 2019-10-18 ENCOUNTER — Ambulatory Visit: Payer: Federal, State, Local not specified - PPO | Admitting: Cardiovascular Disease

## 2019-10-18 ENCOUNTER — Other Ambulatory Visit: Payer: Self-pay

## 2019-10-18 VITALS — BP 116/76 | HR 78 | Ht 65.0 in | Wt 217.0 lb

## 2019-10-18 DIAGNOSIS — R079 Chest pain, unspecified: Secondary | ICD-10-CM | POA: Diagnosis not present

## 2019-10-18 DIAGNOSIS — I471 Supraventricular tachycardia: Secondary | ICD-10-CM | POA: Diagnosis not present

## 2019-10-18 MED ORDER — METOPROLOL SUCCINATE ER 25 MG PO TB24: 25.0000 mg | ORAL_TABLET | Freq: Every day | ORAL | 3 refills | Status: DC

## 2019-10-18 NOTE — Patient Instructions (Signed)
Medication Instructions:  Your physician has recommended you make the following change in your medication:  START Metoprolol succinate (Toprol XL) 25 mg once daily  *If you need a refill on your cardiac medications before your next appointment, please call your pharmacy*  Lab Work: None Ordered If you have labs (blood work) drawn today and your tests are completely normal, you will receive your results only by: Marland Kitchen MyChart Message (if you have MyChart) OR . A paper copy in the mail If you have any lab test that is abnormal or we need to change your treatment, we will call you to review the results.   Testing/Procedures: None Ordered   Follow-Up: At [facility], you and your health needs are our priority.  As part of our continuing mission to provide you with exceptional heart care, we have created designated Provider Care Teams.  These Care Teams include your primary Cardiologist (physician) and Advanced Practice Providers (APPs -  Physician Assistants and Nurse Practitioners) who all work together to provide you with the care you need, when you need it.  Your next appointment:   6 month(s)  The format for your next appointment:   Either In Person or Virtual  Provider:   You may see Mertie Moores, MD or one of the following Advanced Practice Providers on your designated Care Team:    Richardson Dopp, PA-C  [place], [place]  Daune Perch, [place]

## 2019-10-18 NOTE — Progress Notes (Signed)
Cardiology Office Note:    Date:  10/18/2019   ID:  Virginia Woods, DOB November 28, 1970, MRN [id number]  PCP:  Mar Daring, PA-C  Cardiologist:  Mertie Moores, MD  Electrophysiologist:  None   Referring MD: Florian Buff*    Chief Complaint  Patient presents with  . Tachycardia     April 10, 2019    Virginia Woods is a 49 y.o. female with a hx of anemia.    She was incidentally noted to have pauses in her ECG which was read out as Mobitz II.    We are asked to see her today by Mar Daring, PA-C for further evaluation of her palpitations and her abnormal EKG.  My reading of the ECG suggests she had a blocked PAC .  Has episodes of tachypalpitations for years.   These tachypalpitations have recently worsened - 1.5 months ago   She measured her HR in 170s on her treadmill .  She went to lie down and put ice packs up to her face and neck..  Lasted > 30 min. Associated with lightheadedness, some chest tightness.   + sweats.   Works out regularly without any CP , or tachycardia  Daughter has WPW. [name] has learned the manovers to terminate SVT)   Recently she has started working out and has noticed skipped beats.  Works as a Education officer, museum   Has chiari malformation in 2011.   Had to have cranial decompression.  October 18, 2019: Virginia Woods is seen back today for follow-up of her supraventricular tachycardia. Has been working at home and has gained.  Knows she needs to work out  Is a Pharmacist, hospital , works from home   Has had some episodes of SVT  Tried taking a propranolol but it made her stomach ache.  Has had 3-4 episodes of SVT since I last saw her  Last up to  30 min Not related to exercise,  Eating , drinking .   Lots of stress at home .   She has seen Dr. Lovena Le for consultation and has not decided if she wants to have an ablation   Past Medical History:  Diagnosis Date  . Anemia   . Anxiety   . Complication of anesthesia    pt  reports some complication after 2nd brain surgery, but unsure of what it was.  . Dental bridge present    Perrmanent dental retainer - bottom  . Depression   . Difficult intubation    with 2nd chiari surgery (per pt)  . Family history of adverse reaction to anesthesia    reports mother had a "negative reaction" to "anesthesia or lidocaine"  . Headache   . History of Chiari malformation 2011   2 surgeries 2011 [facility]  . Vitamin B12 deficiency   . Vitamin D deficiency   . Wears contact lenses     Past Surgical History:  Procedure Laterality Date  . BRAIN SURGERY  2011   2 surgeries for chiari malformation - [facility]  . CESAREAN SECTION  1996  . COLONOSCOPY  2014  . COLONOSCOPY WITH PROPOFOL N/A 06/20/2019   Procedure: COLONOSCOPY WITH PROPOFOL;  Surgeon: Lin Landsman, MD;  Location: [place];  Service: Endoscopy;  Laterality: N/A;  . ESOPHAGOGASTRODUODENOSCOPY (EGD) WITH PROPOFOL N/A 06/20/2019   Procedure: ESOPHAGOGASTRODUODENOSCOPY (EGD) WITH PROPOFOL;  Surgeon: Lin Landsman, MD;  Location: [place];  Service: Endoscopy;  Laterality: N/A;  . TUBAL LIGATION  2002  Current Medications: Current Meds  Medication Sig  . albuterol (PROVENTIL HFA;VENTOLIN HFA) 108 (90 Base) MCG/ACT inhaler Inhale 2 puffs into the lungs every 6 (six) hours as needed for wheezing or shortness of breath.  . ALPRAZolam (XANAX) 0.5 MG tablet Take 1 tablet (0.5 mg total) by mouth 2 (two) times daily as needed.  . Ascorbic Acid (VITAMIN C PO) Take by mouth.  . citalopram (CELEXA) 40 MG tablet TAKE 1 TABLET BY MOUTH EVERY DAY  . Cyanocobalamin (VITAMIN B 12 PO) Take by mouth.  . diphenhydrAMINE (BENADRYL) 25 MG tablet Take 25 mg by mouth at bedtime as needed for sleep.  Marland Kitchen gabapentin (NEURONTIN) 300 MG capsule TAKE 1 CAPSULE BY MOUTH TWICE A DAY  . loratadine (CLARITIN) 10 MG tablet Take 10 mg by mouth daily as needed.   . OMEGA-3 FATTY ACIDS PO   . omeprazole (PRILOSEC) 40 MG  capsule TAKE 1 CAPSULE (40 MG TOTAL) BY MOUTH 2 (TWO) TIMES DAILY BEFORE A MEAL.  Marland Kitchen propranolol (INDERAL) 10 MG tablet Take 1 tablet (10 mg total) by mouth 4 (four) times daily as needed (palpitations).  . Vitamin D, Ergocalciferol, (DRISDOL) 1.25 MG (50000 UT) CAPS capsule TAKE ONE CAPSULE BY MOUTH EVERY 7 DAYS     Allergies:   Doxycycline and Tamiflu [oseltamivir phosphate]   Social History   Socioeconomic History  . Marital status: Married    Spouse name: Not on file  . Number of children: Not on file  . Years of education: Not on file  . Highest education level: Not on file  Occupational History  . Not on file  Tobacco Use  . Smoking status: Never Smoker  . Smokeless tobacco: Never Used  Substance and Sexual Activity  . Alcohol use: Yes    Comment: 1-2 glasses wine per month  . Drug use: No  . Sexual activity: Not on file  Other Topics Concern  . Not on file  Social History Narrative  . Not on file   Social Determinants of Health   Financial Resource Strain:   . Difficulty of Paying Living Expenses: Not on file  Food Insecurity:   . Worried About Charity fundraiser in the Last Year: Not on file  . Ran Out of Food in the Last Year: Not on file  Transportation Needs:   . Lack of Transportation (Medical): Not on file  . Lack of Transportation (Non-Medical): Not on file  Physical Activity:   . Days of Exercise per Week: Not on file  . Minutes of Exercise per Session: Not on file  Stress:   . Feeling of Stress : Not on file  Social Connections:   . Frequency of Communication with Friends and Family: Not on file  . Frequency of Social Gatherings with Friends and Family: Not on file  . Attends Religious Services: Not on file  . Active Member of Clubs or Organizations: Not on file  . Attends Archivist Meetings: Not on file  . Marital Status: Not on file     Family History: The patient's family history includes Cancer in her mother; Heart disease in her  maternal grandmother; Hypertension in her mother and sister.  ROS:   Please see the history of present illness.     All other systems reviewed and are negative.  EKGs/Labs/Other Studies Reviewed:    The following studies were reviewed today:  EKG:    October 18, 2019: Normal sinus rhythm at 78.  Incomplete right bundle branch block.  Recent Labs: 03/16/2019: ALT 13; Hemoglobin 12.4; Platelets 316; TSH 1.190 04/10/2019: BUN 10; Creatinine, Ser 0.77; Potassium 4.2; Sodium 141  Recent Lipid Panel    Component Value Date/Time   CHOL 132 06/28/2018 1606   TRIG 91 06/28/2018 1606   HDL 51 06/28/2018 1606   CHOLHDL 2.6 06/28/2018 1606   LDLCALC 63 06/28/2018 1606    Physical Exam:    Physical Exam: Blood pressure 116/76, pulse 78, height 5\' 5"  (1.651 m), weight 217 lb (98.4 kg), SpO2 (!) 78 %.  GEN:  Well nourished, well developed in no acute distress HEENT: Normal NECK: No JVD; No carotid bruits LYMPHATICS: No lymphadenopathy CARDIAC: RRR  , no murmurs, rubs, gallops RESPIRATORY:  Clear to auscultation without rales, wheezing or rhonchi  ABDOMEN: Soft, non-tender, non-distended MUSCULOSKELETAL:  No edema; No deformity  SKIN: Warm and dry NEUROLOGIC:  Alert and oriented x 3   ASSESSMENT:    1. SVT (supraventricular tachycardia) (HCC)   2. Chest pain of uncertain etiology    PLAN:    In order of problems listed above:  1. Supraventricular tachycardia:  Alyvea continues to have some palpitations.  They are not nearly as severe as they used to be.  She takes propranolol as needed but she thinks it caused some abdominal pain.  She is under lots of stress.  She will work on ways of addressing her stress.  We will start her on Toprol-XL 25 mg a day for her SVT.  I will see her again in 6 months.   Medication Adjustments/Labs and Tests Ordered: Current medicines are reviewed at length with the patient today.  Concerns regarding medicines are outlined above.  Orders Placed  This Encounter  Procedures  . EKG 12-Lead   Meds ordered this encounter  Medications  . metoprolol succinate (TOPROL-XL) 25 MG 24 hr tablet    Sig: Take 1 tablet (25 mg total) by mouth daily.    Dispense:  90 tablet    Refill:  3    Patient Instructions  Medication Instructions:  Your physician has recommended you make the following change in your medication:  START Metoprolol succinate (Toprol XL) 25 mg once daily  *If you need a refill on your cardiac medications before your next appointment, please call your pharmacy*  Lab Work: None Ordered If you have labs (blood work) drawn today and your tests are completely normal, you will receive your results only by: Marland Kitchen MyChart Message (if you have MyChart) OR . A paper copy in the mail If you have any lab test that is abnormal or we need to change your treatment, we will call you to review the results.   Testing/Procedures: None Ordered   Follow-Up: At [facility], you and your health needs are our priority.  As part of our continuing mission to provide you with exceptional heart care, we have created designated Provider Care Teams.  These Care Teams include your primary Cardiologist (physician) and Advanced Practice Providers (APPs -  Physician Assistants and Nurse Practitioners) who all work together to provide you with the care you need, when you need it.  Your next appointment:   6 month(s)  The format for your next appointment:   Either In Person or Virtual  Provider:   You may see Mertie Moores, MD or one of the following Advanced Practice Providers on your designated Care Team:    Richardson Dopp, PA-C  [place], PA-C  Daune Perch, NP       Signed, Arnette Norris  Suzanne Kho, MD  10/18/2019 6:11 PM    [facility]

## 2019-10-26 ENCOUNTER — Ambulatory Visit: Payer: Federal, State, Local not specified - PPO | Attending: Internal Medicine

## 2019-10-26 DIAGNOSIS — Z20822 Contact with and (suspected) exposure to covid-19: Secondary | ICD-10-CM | POA: Diagnosis not present

## 2019-10-27 LAB — NOVEL CORONAVIRUS, NAA: SARS-CoV-2, NAA: NOT DETECTED

## 2019-11-06 ENCOUNTER — Encounter: Payer: Self-pay | Admitting: Physician Assistant

## 2019-11-12 ENCOUNTER — Telehealth: Payer: Self-pay | Admitting: Cardiovascular Disease

## 2019-11-12 NOTE — Telephone Encounter (Signed)
New Message   Patient is calling she has SVT and indicated that she had an episode today while at school. She states that she did take a propranolol. She would like to discuss further. Please call.

## 2019-11-12 NOTE — Telephone Encounter (Signed)
Spoke with patient who states she had an episode of SVT today about 3:00 pm while at school. Her Apple watch documented HR >140 bpm but did not give her an exact count. She left school and started driving and then pulled over and called her husband. She took one propranolol and HR did slow done. Reports HR is currently 98 bpm and she states her body feels numb. She has not started Toprol XL 25 mg as advised at her last ov on 1/28. She is concerned about causing a scene at school since the students have returned to campus. I advised her that taking the Toprol could prevent a future episode of SVT but we cannot determine its effectiveness until she starts taking it and reports if SVT reoccurs. I advised her not to drive in the future if SVT occurs. Encouraged her to have propranolol 10 mg on hand as well because she may take that medication in addition to the Toprol for SVT. I spent >15 minutes talking with patient answering her questions and discussing the concerns about SVT occurring during the school day. I advised that unfortunately we cannot determine if and when SVT will occur, but the Toprol may prevent or lessen the chances of a reoccurrence. I advised her to call back with questions or concerns or to report additional episodes and she thanked me for the call.

## 2020-01-02 ENCOUNTER — Ambulatory Visit: Payer: Federal, State, Local not specified - PPO | Attending: Internal Medicine

## 2020-01-02 DIAGNOSIS — Z20822 Contact with and (suspected) exposure to covid-19: Secondary | ICD-10-CM

## 2020-01-03 LAB — NOVEL CORONAVIRUS, NAA: SARS-CoV-2, NAA: NOT DETECTED

## 2020-01-03 LAB — SARS-COV-2, NAA 2 DAY TAT

## 2020-01-21 ENCOUNTER — Ambulatory Visit: Payer: Federal, State, Local not specified - PPO | Attending: Internal Medicine

## 2020-01-21 DIAGNOSIS — Z20822 Contact with and (suspected) exposure to covid-19: Secondary | ICD-10-CM | POA: Diagnosis not present

## 2020-01-22 ENCOUNTER — Telehealth: Payer: Self-pay | Admitting: Physician Assistant

## 2020-01-22 ENCOUNTER — Ambulatory Visit: Payer: Self-pay | Admitting: *Deleted

## 2020-01-22 LAB — SARS-COV-2, NAA 2 DAY TAT

## 2020-01-22 LAB — NOVEL CORONAVIRUS, NAA: SARS-CoV-2, NAA: NOT DETECTED

## 2020-01-22 NOTE — Telephone Encounter (Signed)
pls reach out to this pt as she has had some covid type symptoms with a fever, cough, fatigue, scratchy throat and got tested at [facility]. Just wanting touch base if her test is negative, her fever is gone this am, if she is still is having other symptoms with a negative test result would it be advisable for her to get her covid vaccine on Sat May 8 as she has sch, or postpone? Has high anxiety and wants to speak with Jenni's nurse.  @ [phone]

## 2020-01-22 NOTE — Telephone Encounter (Signed)
She called in concerned that she is being "forced" to reveal if she has had the COVID-19 vaccine or not to her employer.  (School system).   She feels it's a partial HIPPA violation and that she should not have to tell them if she has had the vaccine or not.   She has not had the vaccine but is having symptoms.   Waiting for COVID-19 test result.   It was done yesterday.  See documentation notes below.  I suggested she talk with someone in the [employer].   Each employer has their policy regarding this and the HR department should be able to advise her in regards to her concerns of revealing if she has had the vaccine or not. She was agreeable to this plan.   She thanked me very much for my help and for listening to her concerns.      Reason for Disposition . General information question, no triage required and triager able to answer question  Answer Assessment - Initial Assessment Questions 1. REASON FOR CALL or QUESTION: "What is your reason for calling today?" or "How can I best help you?" or "What question do you have that I can help answer?"     Over the weekend I had a fever on Sunday and scratchy throat.   Saturday I went to a function a birthday party.   I was sitting at a table with others inside.  I left after 15 minutes because I did not feel comfortable in the situation so close to others.  I've not had the vaccine.   I started having symptoms on Sunday.   I went to the park.   I'm wondering if my allergies are acting up or it's COVID.  I had a COVID-19 test done yesterday.  I'm a Pharmacist, hospital.   They want to know if I've had the COVID-19 shot.   I don't think it's the school's business whether I've had the shot or not.    The school sent an e mail saying if I have had the shot I can come back to work.   I don't feel that's their business.   I feel like it's a HIPPA violation to tell the school if I'm vaccinated or not.  It's not required that I get the shot for my job.   It's  optional.    I'm planning on getting the COVID-19 vaccine soon.   I just have not had it yet.    I need to wait for my COVID-19 test to come back now before I do anything regarding the COVID vaccine.  I have worked in [place] before so I know what I can share and not share.    "I don't mean to take up your time I just needed to talk with someone about this".  " It bothers me that I'm being "forced" to reveal if I've had the COVID vaccine or not".   "They put it in a way that you have to reveal if you have had the shot or not and I'm not comfortable with that"  Unless I'm required to reveal that I've had the shot for my job I don't feel it's their business.   The principle sent the e mail saying that if I've had the vaccine I can  Come to work even with symptoms.  Protocols used: INFORMATION ONLY CALL-A-AH

## 2020-01-22 NOTE — Telephone Encounter (Signed)
Please Advise

## 2020-01-23 NOTE — Telephone Encounter (Signed)
Her covid testing is negative. It is recommended now by the CDC to quarantine and stay out of work for 7 days from symptom onset with a negative test. Has to have 1 day fever free without having to take medication to lower fever.

## 2020-01-24 NOTE — Telephone Encounter (Signed)
LM to call back. If patient calls back OK for the [facility] nurse to give message.

## 2020-01-25 NOTE — Telephone Encounter (Signed)
Returned phone call to pt.  Advised she can call in on Monday, 5/10, after 9:30 AM, to inquire about vaccine appt. availability.  Advised to call [phone].  Pt. verb. understanding, and agreed with plan.  Pt. requested to cancel the appt. scheduled on 01/26/20 for her 1st vaccine. Advised will cancel this appt., per her request.

## 2020-01-25 NOTE — Telephone Encounter (Signed)
Vaccine appt do not open up til Monday of the week they are to be scheduled so appt for 5/15 will not open until Monday 01/28/20 at 10am.   As for her symptoms they do sound like allergy symptoms. Continue claritin. Can add flonase OTC. Can also add allergy eye drops like pataday also OTC.

## 2020-01-25 NOTE — Telephone Encounter (Signed)
Phone call to pt.  Advised of negative COVID results, and of recommendation per Fenton Malling, to follow CDC guidelines of quarantining x 7 days with negative test but symptomatic.  Pt. Stated she has not had fever since Monday, 5/3.  Stated she has an intermittent cough and watery eyes.  Stated she is feeling better than she did last Sunday and Monday.  Reported she returned to work on [employer] 5/4, per okay from school Principal.   Stated her temperature gets checked every AM, when she reports to school, and has not had any recurrence of fever.  Pt. Questioned if she should proceed with her 1st dose vaccine appt. Tomorrow.  Advised that the recommended guidelines when COVID symptoms are present, is to complete the quarantine and be asymptomatic, as any COVID-type symptoms can mask a reaction to the vaccine.   Pt. Advised that no vaccine appt. Avail. On Sat. 5/15.  Pt. Requested this nurse to call her back on her lunch break at noon today, to discuss/ reschedule vaccine appt.  Will also send note to PCP to make aware of pt's. current symptoms.

## 2020-01-26 ENCOUNTER — Ambulatory Visit: Payer: Federal, State, Local not specified - PPO

## 2020-01-31 ENCOUNTER — Ambulatory Visit: Payer: Federal, State, Local not specified - PPO | Admitting: Physician Assistant

## 2020-02-09 ENCOUNTER — Ambulatory Visit: Payer: Federal, State, Local not specified - PPO | Attending: Internal Medicine

## 2020-02-09 ENCOUNTER — Ambulatory Visit: Payer: Federal, State, Local not specified - PPO

## 2020-02-09 DIAGNOSIS — Z23 Encounter for immunization: Secondary | ICD-10-CM

## 2020-02-09 NOTE — Progress Notes (Signed)
   Covid-19 Vaccination Clinic  Name:  Virginia Woods    MRN: [id number] DOB: 1970-12-05  02/09/2020  [name] was observed post Covid-19 immunization for 15 minutes without incident. She was provided with Vaccine Information Sheet and instruction to access the V-Safe system.   [name] was instructed to call 911 with any severe reactions post vaccine: Marland Kitchen Difficulty breathing  . Swelling of face and throat  . A fast heartbeat  . A bad rash all over body  . Dizziness and weakness   Immunizations Administered    Name Date Dose VIS Date Route   Pfizer COVID-19 Vaccine 02/09/2020  8:43 AM 0.3 mL 11/14/2018 Intramuscular   Manufacturer: [place]   Lot: [id number]   [place]: [id number]

## 2020-02-28 DIAGNOSIS — F411 Generalized anxiety disorder: Secondary | ICD-10-CM | POA: Diagnosis not present

## 2020-03-03 ENCOUNTER — Ambulatory Visit: Payer: Federal, State, Local not specified - PPO | Attending: Internal Medicine

## 2020-03-03 DIAGNOSIS — Z23 Encounter for immunization: Secondary | ICD-10-CM

## 2020-03-03 NOTE — Progress Notes (Signed)
° °  Covid-19 Vaccination Clinic  Name:  Virginia Woods    MRN: [id number] DOB: 1971-05-26  03/03/2020  Virginia Woods was observed post Covid-19 immunization for 15 minutes without incident. She was provided with Vaccine Information Sheet and instruction to access the V-Safe system.   Virginia Woods was instructed to call 911 with any severe reactions post vaccine:  Difficulty breathing   Swelling of face and throat   A fast heartbeat   A bad rash all over body   Dizziness and weakness   Immunizations Administered    Name Date Dose VIS Date Route   Pfizer COVID-19 Vaccine 03/03/2020  3:23 PM 0.3 mL 11/14/2018 Intramuscular   Manufacturer: [place]   Lot: [id number]   [place]: [id number]

## 2020-03-18 NOTE — Progress Notes (Signed)
Complete physical exam   Patient: Virginia Woods   DOB: 1971-02-05   [name]  MRN: [id number] Visit Date: 03/20/2020  Today's healthcare provider: Mar Daring, PA-C   Chief Complaint  Patient presents with  . Annual Exam   Subjective    Virginia Woods is a 49 y.o. female who presents today for a complete physical exam.  She reports consuming a general diet. The patient does not participate in regular exercise at present. She generally feels well. She reports sleeping fairly well to poorly. She does not have additional problems to discuss today.  HPI  11/07/2015-Pap is normal. Will repeat in 3 years.  Past Medical History:  Diagnosis Date  . Anemia   . Anxiety   . Complication of anesthesia    pt reports some complication after 2nd brain surgery, but unsure of what it was.  . Dental bridge present    Perrmanent dental retainer - bottom  . Depression   . Difficult intubation    with 2nd chiari surgery (per pt)  . Family history of adverse reaction to anesthesia    reports mother had a "negative reaction" to "anesthesia or lidocaine"  . Headache   . History of Chiari malformation 2011   2 surgeries 2011 [facility]  . Vitamin B12 deficiency   . Vitamin D deficiency   . Wears contact lenses    Past Surgical History:  Procedure Laterality Date  . BRAIN SURGERY  2011   2 surgeries for chiari malformation - [facility]  . CESAREAN SECTION  1996  . COLONOSCOPY  2014  . COLONOSCOPY WITH PROPOFOL N/A 06/20/2019   Procedure: COLONOSCOPY WITH PROPOFOL;  Surgeon: Lin Landsman, MD;  Location: [place];  Service: Endoscopy;  Laterality: N/A;  . ESOPHAGOGASTRODUODENOSCOPY (EGD) WITH PROPOFOL N/A 06/20/2019   Procedure: ESOPHAGOGASTRODUODENOSCOPY (EGD) WITH PROPOFOL;  Surgeon: Lin Landsman, MD;  Location: [place];  Service: Endoscopy;  Laterality: N/A;  . TUBAL LIGATION  2002   Social History   Socioeconomic History  . Marital  status: Married    Spouse name: Not on file  . Number of children: Not on file  . Years of education: Not on file  . Highest education level: Not on file  Occupational History  . Not on file  Tobacco Use  . Smoking status: Never Smoker  . Smokeless tobacco: Never Used  Vaping Use  . Vaping Use: Never used  Substance and Sexual Activity  . Alcohol use: Yes    Comment: 1-2 glasses wine per month  . Drug use: No  . Sexual activity: Not on file  Other Topics Concern  . Not on file  Social History Narrative  . Not on file   Social Determinants of Health   Financial Resource Strain:   . Difficulty of Paying Living Expenses:   Food Insecurity:   . Worried About Charity fundraiser in the Last Year:   . Arboriculturist in the Last Year:   Transportation Needs:   . Film/video editor (Medical):   Marland Kitchen Lack of Transportation (Non-Medical):   Physical Activity:   . Days of Exercise per Week:   . Minutes of Exercise per Session:   Stress:   . Feeling of Stress :   Social Connections:   . Frequency of Communication with Friends and Family:   . Frequency of Social Gatherings with Friends and Family:   . Attends Religious Services:   .  Active Member of Clubs or Organizations:   . Attends Archivist Meetings:   Marland Kitchen Marital Status:   Intimate Partner Violence:   . Fear of Current or Ex-Partner:   . Emotionally Abused:   Marland Kitchen Physically Abused:   . Sexually Abused:    Family Status  Relation Name Status  . Mother  Alive  . Father  Deceased       Kidney failure, kidney transplant  . Sister  Alive  . Ethlyn Daniels  Alive       Brain aneurysm  . MGM  Deceased       MI  . PGM  Deceased       Brain aneurysm   Family History  Problem Relation Age of Onset  . Hypertension Mother   . Cancer Mother        Laryngeal and lymphoma  . Hypertension Sister   . Heart disease Maternal Grandmother    Allergies  Allergen Reactions  . Doxycycline Itching and Rash  . Tamiflu  [Oseltamivir Phosphate] Rash    Patient Care Team: Mar Daring, PA-C as PCP - General (Family Medicine) Nahser, Wonda Cheng, MD as PCP - Cardiology (Cardiology)   Medications: Outpatient Medications Prior to Visit  Medication Sig  . Ascorbic Acid (VITAMIN C PO) Take by mouth.  . Cyanocobalamin (VITAMIN B 12 PO) Take by mouth.  . diphenhydrAMINE (BENADRYL) 25 MG tablet Take 25 mg by mouth at bedtime as needed for sleep.  Marland Kitchen loratadine (CLARITIN) 10 MG tablet Take 10 mg by mouth daily as needed.   . OMEGA-3 FATTY ACIDS PO   . Vitamin D, Ergocalciferol, (DRISDOL) 1.25 MG (50000 UT) CAPS capsule TAKE ONE CAPSULE BY MOUTH EVERY 7 DAYS  . [DISCONTINUED] albuterol (PROVENTIL HFA;VENTOLIN HFA) 108 (90 Base) MCG/ACT inhaler Inhale 2 puffs into the lungs every 6 (six) hours as needed for wheezing or shortness of breath.  . [DISCONTINUED] ALPRAZolam (XANAX) 0.5 MG tablet Take 1 tablet (0.5 mg total) by mouth 2 (two) times daily as needed.  . [DISCONTINUED] citalopram (CELEXA) 40 MG tablet TAKE 1 TABLET BY MOUTH EVERY DAY  . [DISCONTINUED] gabapentin (NEURONTIN) 300 MG capsule TAKE 1 CAPSULE BY MOUTH TWICE A DAY  . [DISCONTINUED] metoprolol succinate (TOPROL-XL) 25 MG 24 hr tablet Take 1 tablet (25 mg total) by mouth daily.  . [DISCONTINUED] propranolol (INDERAL) 10 MG tablet Take 1 tablet (10 mg total) by mouth 4 (four) times daily as needed (palpitations).  . [DISCONTINUED] omeprazole (PRILOSEC) 40 MG capsule TAKE 1 CAPSULE (40 MG TOTAL) BY MOUTH 2 (TWO) TIMES DAILY BEFORE A MEAL.   No facility-administered medications prior to visit.    Review of Systems  Constitutional: Negative.   HENT: Negative.   Eyes: Negative.   Respiratory: Negative.   Cardiovascular: Positive for palpitations and leg swelling.  Gastrointestinal: Negative.   Endocrine: Negative.   Genitourinary: Negative.   Musculoskeletal: Positive for arthralgias, back pain, joint swelling, neck pain and neck stiffness.    Skin: Negative.   Allergic/Immunologic: Negative.   Neurological: Positive for numbness and headaches.  Hematological: Negative.   Psychiatric/Behavioral: Positive for confusion. The patient is nervous/anxious.       Objective    BP 114/78 (BP Location: Left Arm, Patient Position: Sitting, Cuff Size: Large)   Pulse 84   Temp 97.6 F (36.4 C) (Temporal)   Resp 16   Ht 5\' 5"  (1.651 m)   Wt 216 lb (98 kg)   LMP 02/24/2020   BMI 35.94  kg/m    Physical Exam Vitals reviewed.  Constitutional:      General: She is not in acute distress.    Appearance: Normal appearance. She is well-developed. She is obese. She is not ill-appearing or diaphoretic.  HENT:     Head: Normocephalic and atraumatic.     Right Ear: Hearing, tympanic membrane, ear canal and external ear normal.     Left Ear: Hearing, tympanic membrane, ear canal and external ear normal.     Nose: Nose normal.     Mouth/Throat:     Mouth: Mucous membranes are moist.     Pharynx: Oropharynx is clear. Uvula midline. No oropharyngeal exudate or posterior oropharyngeal erythema.  Eyes:     General: No scleral icterus.       Right eye: No discharge.        Left eye: No discharge.     Extraocular Movements: Extraocular movements intact.     Conjunctiva/sclera: Conjunctivae normal.     Pupils: Pupils are equal, round, and reactive to light.  Neck:     Thyroid: No thyromegaly.     Vascular: No carotid bruit or JVD.     Trachea: No tracheal deviation.  Cardiovascular:     Rate and Rhythm: Normal rate and regular rhythm.     Pulses: Normal pulses.     Heart sounds: Normal heart sounds. No murmur heard.  No friction rub. No gallop.   Pulmonary:     Effort: Pulmonary effort is normal. No respiratory distress.     Breath sounds: Normal breath sounds. No wheezing or rales.  Chest:     Chest wall: No tenderness.     Breasts: Breasts are symmetrical.        Right: No inverted nipple, mass, nipple discharge, skin change or  tenderness.        Left: No inverted nipple, mass, nipple discharge, skin change or tenderness.  Abdominal:     General: Abdomen is flat. Bowel sounds are normal. There is no distension.     Palpations: Abdomen is soft. There is no mass.     Tenderness: There is no abdominal tenderness. There is no guarding or rebound.     Hernia: There is no hernia in the left inguinal area.  Genitourinary:    General: Normal vulva.     Exam position: Supine.     Labia:        Right: No rash, tenderness, lesion or injury.        Left: No rash, tenderness, lesion or injury.      Vagina: Normal. No signs of injury. No vaginal discharge, erythema, tenderness or bleeding.     Cervix: No cervical motion tenderness, discharge or friability.     Adnexa:        Right: No mass, tenderness or fullness.         Left: No mass, tenderness or fullness.       Rectum: Normal.  Musculoskeletal:        General: No tenderness. Normal range of motion.     Cervical back: Normal range of motion and neck supple.     Right lower leg: No edema.     Left lower leg: No edema.  Lymphadenopathy:     Cervical: No cervical adenopathy.  Skin:    General: Skin is warm and dry.     Capillary Refill: Capillary refill takes less than 2 seconds.     Findings: No rash.  Neurological:     General:  No focal deficit present.     Mental Status: She is alert and oriented to person, place, and time. Mental status is at baseline.     Cranial Nerves: No cranial nerve deficit.     Coordination: Coordination normal.     Deep Tendon Reflexes: Reflexes are normal and symmetric.  Psychiatric:        Mood and Affect: Mood normal.        Behavior: Behavior normal.        Thought Content: Thought content normal.        Judgment: Judgment normal.      Last depression screening scores PHQ 2/9 Scores 03/20/2020 06/28/2018 11/23/2017  PHQ - 2 Score 0 1 1  PHQ- 9 Score 3 2 -  Exception Documentation - - -   Last fall risk screening Fall Risk   06/28/2018  Falls in the past year? No  Number falls in past yr: -  Injury with Fall? -   Last Audit-C alcohol use screening Alcohol Use Disorder Test (AUDIT) 03/20/2020  1. How often do you have a drink containing alcohol? 2  2. How many drinks containing alcohol do you have on a typical day when you are drinking? 0  3. How often do you have six or more drinks on one occasion? 0  AUDIT-C Score 2  Alcohol Brief Interventions/Follow-up AUDIT Score <7 follow-up not indicated   A score of 3 or more in women, and 4 or more in men indicates increased risk for alcohol abuse, EXCEPT if all of the points are from question 1   Results for orders placed or performed in visit on 03/20/20  Comprehensive metabolic panel  Result Value Ref Range   Glucose 76 65 - 99 mg/dL   BUN 9 6 - 24 mg/dL   Creatinine, Ser 0.80 0.57 - 1.00 mg/dL   GFR calc non Af Amer 87 >59 mL/min/1.73   GFR calc Af Amer 100 >59 mL/min/1.73   BUN/Creatinine Ratio 11 9 - 23   Sodium 139 134 - 144 mmol/L   Potassium 4.2 3.5 - 5.2 mmol/L   Chloride 100 96 - 106 mmol/L   CO2 26 20 - 29 mmol/L   Calcium 9.5 8.7 - 10.2 mg/dL   Total Protein 6.9 6.0 - 8.5 g/dL   Albumin 4.4 3.8 - 4.8 g/dL   Globulin, Total 2.5 1.5 - 4.5 g/dL   Albumin/Globulin Ratio 1.8 1.2 - 2.2   Bilirubin Total 0.4 0.0 - 1.2 mg/dL   Alkaline Phosphatase 73 48 - 121 IU/L   AST 17 0 - 40 IU/L   ALT 14 0 - 32 IU/L  Lipid panel  Result Value Ref Range   Cholesterol, Total 144 100 - 199 mg/dL   Triglycerides 69 0 - 149 mg/dL   HDL 55 >39 mg/dL   VLDL Cholesterol Cal 14 5 - 40 mg/dL   LDL Chol Calc (NIH) 75 0 - 99 mg/dL   Chol/HDL Ratio 2.6 0.0 - 4.4 ratio  TSH  Result Value Ref Range   TSH 0.877 0.450 - 4.500 uIU/mL  CBC with Differential/Platelet  Result Value Ref Range   WBC 4.7 3.4 - 10.8 x10E3/uL   RBC 5.34 (H) 3.77 - 5.28 x10E6/uL   Hemoglobin 12.3 11.1 - 15.9 g/dL   Hematocrit 40.6 34.0 - 46.6 %   MCV 76 (L) 79 - 97 fL   MCH 23.0 (L) 26.6 -  33.0 pg   MCHC 30.3 (L) 31 - 35 g/dL  RDW 14.0 11.7 - 15.4 %   Platelets 266 150 - 450 x10E3/uL   Neutrophils 52 Not Estab. %   Lymphs 37 Not Estab. %   Monocytes 9 Not Estab. %   Eos 1 Not Estab. %   Basos 1 Not Estab. %   Neutrophils Absolute 2.5 1 - 7 x10E3/uL   Lymphocytes Absolute 1.7 0 - 3 x10E3/uL   Monocytes Absolute 0.4 0 - 0 x10E3/uL   EOS (ABSOLUTE) 0.1 0.0 - 0.4 x10E3/uL   Basophils Absolute 0.0 0 - 0 x10E3/uL   Immature Granulocytes 0 Not Estab. %   Immature Grans (Abs) 0.0 0.0 - 0.1 x10E3/uL  Hemoglobin A1c  Result Value Ref Range   Hgb A1c MFr Bld 5.6 4.8 - 5.6 %   Est. average glucose Bld gHb Est-mCnc 114 mg/dL  HIV antibody (with reflex)  Result Value Ref Range   HIV Screen 4th Generation wRfx Non Reactive Non Reactive  Vitamin D (25 hydroxy)  Result Value Ref Range   Vit D, 25-Hydroxy 22.5 (L) 30.0 - 100.0 ng/mL  Specimen status report  Result Value Ref Range   specimen status report Comment   HCV Ab w Reflex to Quant PCR  Result Value Ref Range   HCV Ab <0.1 0.0 - 0.9 s/co ratio  Interpretation:  Result Value Ref Range   HCV Interp 1: Comment   Specimen status report  Result Value Ref Range   specimen status report Comment   Cytology - PAP  Result Value Ref Range   High risk HPV Negative    Adequacy      Satisfactory for evaluation; transformation zone component PRESENT.   Diagnosis      - Negative for intraepithelial lesion or malignancy (NILM)   Comment Normal Reference Range HPV - Negative     Assessment & Plan    Routine Health Maintenance and Physical Exam  Exercise Activities and Dietary recommendations Goals   None     Immunization History  Administered Date(s) Administered  . PFIZER SARS-COV-2 Vaccination 02/09/2020, 03/03/2020  . Tdap 06/08/2010    Health Maintenance  Topic Date Due  . MAMMOGRAM  03/25/2020  . INFLUENZA VACCINE  04/20/2020  . TETANUS/TDAP  06/08/2020  . PAP SMEAR-Modifier  03/21/2023  . COLONOSCOPY   06/19/2029  . COVID-19 Vaccine  Completed  . Hepatitis C Screening  Completed  . HIV Screening  Completed    Discussed health benefits of physical activity, and encouraged her to engage in regular exercise appropriate for her age and condition.  1. Routine general medical examination at a health care facility Normal physical exam today. Will check labs as below and f/u pending lab results. If labs are stable and WNL she will not need to have these rechecked for one year at her next annual physical exam. She is to call the office in the meantime if she has any acute issue, questions or concerns.  2. Breast cancer screening by mammogram Breast exam today was normal. There is no family history of breast cancer. She does perform regular self breast exams. Mammogram was ordered as below. Information for [facility] Breast clinic was given to patient so she may schedule her mammogram at her convenience. - MM 3D Screening Bilateral; Future  3. Cervical cancer screening Pap collected today. Will send as below and f/u pending results. - Cytology - PAP  4. Encounter for special screening examination for cardiovascular disorder Will check labs as below and f/u pending results. - Lipid panel  5.  Screening for metabolic disorder Will check labs as below and f/u pending results. - Comprehensive metabolic panel - Hemoglobin A1c  6. Screening for thyroid disorder Will check labs as below and f/u pending results. - TSH  7. Mobitz type 2 second degree heart block Currently stable. Followed by Cardiology.  - Comprehensive metabolic panel - CBC with Differential/Platelet  8. SVT (supraventricular tachycardia) (HCC) Stable. Continue Metoprolol XR 25mg  daily.  - Comprehensive metabolic panel - CBC with Differential/Platelet  9. Arnold-Chiari malformation (HCC) Stable. Has intermittent headaches.  - CBC with Differential/Platelet  10. Cervical radiculopathy Stable. Uses Gabapentin 300mg  BID.    11. Avitaminosis D H/O this and postmenopausal. Will check labs as below and f/u pending results. - Vitamin D (25 hydroxy)  12. History of iron deficiency H/O this. Will check labs as below and f/u pending results. - CBC with Differential/Platelet  13. Encounter for hepatitis C screening test for low risk patient Will check labs as below and f/u pending results. - Hepatitis c antibody (reflex)  14. Screening for HIV without presence of risk factors Will check labs as below and f/u pending results. - HIV antibody (with reflex)  15. Colon cancer screening Last done on 06/20/2019. Normal. Repeat in 10 years.   16. Class 2 severe obesity due to excess calories with serious comorbidity and body mass index (BMI) of 35.0 to 35.9 in adult [facility]) Counseled patient on healthy lifestyle modifications including dieting and exercise.   17. Moderate episode of recurrent major depressive disorder (HCC) Stable. In counseling. Continue Alprazolam 0.5mg  BID prn, citalopram 40mg  daily.   Return in about 1 year (around 03/20/2021).     Reynolds Bowl, PA-C, have reviewed all documentation for this visit. The documentation on 03/30/20 for the exam, diagnosis, procedures, and orders are all accurate and complete.   Rubye Beach  [facility] [phone] (phone) [phone] (fax)  [place]

## 2020-03-20 ENCOUNTER — Encounter: Payer: Self-pay | Admitting: Physician Assistant

## 2020-03-20 ENCOUNTER — Other Ambulatory Visit (HOSPITAL_COMMUNITY)
Admission: RE | Admit: 2020-03-20 | Discharge: 2020-03-20 | Disposition: A | Discharge status: Home / Self Care | Payer: Federal, State, Local not specified - PPO | Source: Ambulatory Visit | Attending: Physician Assistant | Admitting: Physician Assistant

## 2020-03-20 ENCOUNTER — Other Ambulatory Visit: Payer: Self-pay

## 2020-03-20 ENCOUNTER — Ambulatory Visit (INDEPENDENT_AMBULATORY_CARE_PROVIDER_SITE_OTHER): Payer: Federal, State, Local not specified - PPO | Admitting: Physician Assistant

## 2020-03-20 VITALS — BP 114/78 | HR 84 | Temp 97.6°F | Resp 16 | Ht 65.0 in | Wt 216.0 lb

## 2020-03-20 DIAGNOSIS — Z1231 Encounter for screening mammogram for malignant neoplasm of breast: Secondary | ICD-10-CM

## 2020-03-20 DIAGNOSIS — Z8639 Personal history of other endocrine, nutritional and metabolic disease: Secondary | ICD-10-CM

## 2020-03-20 DIAGNOSIS — Z1159 Encounter for screening for other viral diseases: Secondary | ICD-10-CM

## 2020-03-20 DIAGNOSIS — I471 Supraventricular tachycardia, unspecified: Secondary | ICD-10-CM

## 2020-03-20 DIAGNOSIS — Z6835 Body mass index (BMI) 35.0-35.9, adult: Secondary | ICD-10-CM

## 2020-03-20 DIAGNOSIS — F331 Major depressive disorder, recurrent, moderate: Secondary | ICD-10-CM

## 2020-03-20 DIAGNOSIS — Z0000 Encounter for general adult medical examination without abnormal findings: Secondary | ICD-10-CM

## 2020-03-20 DIAGNOSIS — Z124 Encounter for screening for malignant neoplasm of cervix: Secondary | ICD-10-CM | POA: Insufficient documentation

## 2020-03-20 DIAGNOSIS — Z114 Encounter for screening for human immunodeficiency virus [HIV]: Secondary | ICD-10-CM

## 2020-03-20 DIAGNOSIS — I441 Atrioventricular block, second degree: Secondary | ICD-10-CM | POA: Diagnosis not present

## 2020-03-20 DIAGNOSIS — Z1329 Encounter for screening for other suspected endocrine disorder: Secondary | ICD-10-CM

## 2020-03-20 DIAGNOSIS — M5412 Radiculopathy, cervical region: Secondary | ICD-10-CM

## 2020-03-20 DIAGNOSIS — E559 Vitamin D deficiency, unspecified: Secondary | ICD-10-CM

## 2020-03-20 DIAGNOSIS — Z136 Encounter for screening for cardiovascular disorders: Secondary | ICD-10-CM

## 2020-03-20 DIAGNOSIS — Z13228 Encounter for screening for other metabolic disorders: Secondary | ICD-10-CM

## 2020-03-20 DIAGNOSIS — Z1211 Encounter for screening for malignant neoplasm of colon: Secondary | ICD-10-CM

## 2020-03-20 DIAGNOSIS — Q0700 Arnold-Chiari syndrome without spina bifida or hydrocephalus: Secondary | ICD-10-CM

## 2020-03-20 NOTE — Patient Instructions (Signed)
Health Maintenance, Female Adopting a healthy lifestyle and getting preventive care are important in promoting health and wellness. Ask your health care provider about:  The right schedule for you to have regular tests and exams.  Things you can do on your own to prevent diseases and keep yourself healthy. What should I know about diet, weight, and exercise? Eat a healthy diet   Eat a diet that includes plenty of vegetables, fruits, low-fat dairy products, and lean protein.  Do not eat a lot of foods that are high in solid fats, added sugars, or sodium. Maintain a healthy weight Body mass index (BMI) is used to identify weight problems. It estimates body fat based on height and weight. Your health care provider can help determine your BMI and help you achieve or maintain a healthy weight. Get regular exercise Get regular exercise. This is one of the most important things you can do for your health. Most adults should:  Exercise for at least 150 minutes each week. The exercise should increase your heart rate and make you sweat (moderate-intensity exercise).  Do strengthening exercises at least twice a week. This is in addition to the moderate-intensity exercise.  Spend less time sitting. Even light physical activity can be beneficial. Watch cholesterol and blood lipids Have your blood tested for lipids and cholesterol at 50 years of age, then have this test every 5 years. Have your cholesterol levels checked more often if:  Your lipid or cholesterol levels are high.  You are older than 50 years of age.  You are at high risk for heart disease. What should I know about cancer screening? Depending on your health history and family history, you may need to have cancer screening at various ages. This may include screening for:  Breast cancer.  Cervical cancer.  Colorectal cancer.  Skin cancer.  Lung cancer. What should I know about heart disease, diabetes, and high blood  pressure? Blood pressure and heart disease  High blood pressure causes heart disease and increases the risk of stroke. This is more likely to develop in people who have high blood pressure readings, are of African descent, or are overweight.  Have your blood pressure checked: ? Every 3-5 years if you are 18-39 years of age. ? Every year if you are 40 years old or older. Diabetes Have regular diabetes screenings. This checks your fasting blood sugar level. Have the screening done:  Once every three years after age 40 if you are at a normal weight and have a low risk for diabetes.  More often and at a younger age if you are overweight or have a high risk for diabetes. What should I know about preventing infection? Hepatitis B If you have a higher risk for hepatitis B, you should be screened for this virus. Talk with your health care provider to find out if you are at risk for hepatitis B infection. Hepatitis C Testing is recommended for:  Everyone born from 1945 through 1965.  Anyone with known risk factors for hepatitis C. Sexually transmitted infections (STIs)  Get screened for STIs, including gonorrhea and chlamydia, if: ? You are sexually active and are younger than 50 years of age. ? You are older than 50 years of age and your health care provider tells you that you are at risk for this type of infection. ? Your sexual activity has changed since you were last screened, and you are at increased risk for chlamydia or gonorrhea. Ask your health care provider if   you are at risk.  Ask your health care provider about whether you are at high risk for HIV. Your health care provider may recommend a prescription medicine to help prevent HIV infection. If you choose to take medicine to prevent HIV, you should first get tested for HIV. You should then be tested every 3 months for as long as you are taking the medicine. Pregnancy  If you are about to stop having your period (premenopausal) and  you may become pregnant, seek counseling before you get pregnant.  Take 400 to 800 micrograms (mcg) of folic acid every day if you become pregnant.  Ask for birth control (contraception) if you want to prevent pregnancy. Osteoporosis and menopause Osteoporosis is a disease in which the bones lose minerals and strength with aging. This can result in bone fractures. If you are 65 years old or older, or if you are at risk for osteoporosis and fractures, ask your health care provider if you should:  Be screened for bone loss.  Take a calcium or vitamin D supplement to lower your risk of fractures.  Be given hormone replacement therapy (HRT) to treat symptoms of menopause. Follow these instructions at home: Lifestyle  Do not use any products that contain nicotine or tobacco, such as cigarettes, e-cigarettes, and chewing tobacco. If you need help quitting, ask your health care provider.  Do not use street drugs.  Do not share needles.  Ask your health care provider for help if you need support or information about quitting drugs. Alcohol use  Do not drink alcohol if: ? Your health care provider tells you not to drink. ? You are pregnant, may be pregnant, or are planning to become pregnant.  If you drink alcohol: ? Limit how much you use to 0-1 drink a day. ? Limit intake if you are breastfeeding.  Be aware of how much alcohol is in your drink. In the [place] one drink equals one 12 oz bottle of beer (355 mL), one 5 oz glass of wine (148 mL), or one 1 oz glass of hard liquor (44 mL). General instructions  Schedule regular health, dental, and eye exams.  Stay current with your vaccines.  Tell your health care provider if: ? You often feel depressed. ? You have ever been abused or do not feel safe at home. Summary  Adopting a healthy lifestyle and getting preventive care are important in promoting health and wellness.  Follow your health care provider's instructions about healthy  diet, exercising, and getting tested or screened for diseases.  Follow your health care provider's instructions on monitoring your cholesterol and blood pressure. This information is not intended to replace advice given to you by your health care provider. Make sure you discuss any questions you have with your health care provider. Document Revised: 08/30/2018 Document Reviewed: 08/30/2018 Elsevier Patient Education  2020 Elsevier Inc.  

## 2020-03-21 ENCOUNTER — Telehealth: Payer: Self-pay

## 2020-03-21 LAB — CBC WITH DIFFERENTIAL/PLATELET
Basophils Absolute: 0 10*3/uL (ref 0.0–0.2)
Basos: 1 %
EOS (ABSOLUTE): 0.1 10*3/uL (ref 0.0–0.4)
Eos: 1 %
Hematocrit: 40.6 % (ref 34.0–46.6)
Hemoglobin: 12.3 g/dL (ref 11.1–15.9)
Immature Grans (Abs): 0 10*3/uL (ref 0.0–0.1)
Immature Granulocytes: 0 %
Lymphocytes Absolute: 1.7 10*3/uL (ref 0.7–3.1)
Lymphs: 37 %
MCH: 23 pg — ABNORMAL LOW (ref 26.6–33.0)
MCHC: 30.3 g/dL — ABNORMAL LOW (ref 31.5–35.7)
MCV: 76 fL — ABNORMAL LOW (ref 79–97)
Monocytes Absolute: 0.4 10*3/uL (ref 0.1–0.9)
Monocytes: 9 %
Neutrophils Absolute: 2.5 10*3/uL (ref 1.4–7.0)
Neutrophils: 52 %
Platelets: 266 10*3/uL (ref 150–450)
RBC: 5.34 x10E6/uL — ABNORMAL HIGH (ref 3.77–5.28)
RDW: 14 % (ref 11.7–15.4)
WBC: 4.7 10*3/uL (ref 3.4–10.8)

## 2020-03-21 LAB — COMPREHENSIVE METABOLIC PANEL
ALT: 14 IU/L (ref 0–32)
AST: 17 IU/L (ref 0–40)
Albumin/Globulin Ratio: 1.8 (ref 1.2–2.2)
Albumin: 4.4 g/dL (ref 3.8–4.8)
Alkaline Phosphatase: 73 IU/L (ref 48–121)
BUN/Creatinine Ratio: 11 (ref 9–23)
BUN: 9 mg/dL (ref 6–24)
Bilirubin Total: 0.4 mg/dL (ref 0.0–1.2)
CO2: 26 mmol/L (ref 20–29)
Calcium: 9.5 mg/dL (ref 8.7–10.2)
Chloride: 100 mmol/L (ref 96–106)
Creatinine, Ser: 0.8 mg/dL (ref 0.57–1.00)
GFR calc Af Amer: 100 mL/min/{1.73_m2} (ref >59)
GFR calc non Af Amer: 87 mL/min/{1.73_m2} (ref >59)
Globulin, Total: 2.5 g/dL (ref 1.5–4.5)
Glucose: 76 mg/dL (ref 65–99)
Potassium: 4.2 mmol/L (ref 3.5–5.2)
Sodium: 139 mmol/L (ref 134–144)
Total Protein: 6.9 g/dL (ref 6.0–8.5)

## 2020-03-21 LAB — TSH: TSH: 0.877 u[IU]/mL (ref 0.450–4.500)

## 2020-03-21 LAB — LIPID PANEL
Chol/HDL Ratio: 2.6 ratio (ref 0.0–4.4)
Cholesterol, Total: 144 mg/dL (ref 100–199)
HDL: 55 mg/dL (ref >39)
LDL Chol Calc (NIH): 75 mg/dL (ref 0–99)
Triglycerides: 69 mg/dL (ref 0–149)
VLDL Cholesterol Cal: 14 mg/dL (ref 5–40)

## 2020-03-21 LAB — SPECIMEN STATUS REPORT

## 2020-03-21 LAB — HEMOGLOBIN A1C
Est. average glucose Bld gHb Est-mCnc: 114 mg/dL
Hgb A1c MFr Bld: 5.6 % (ref 4.8–5.6)

## 2020-03-21 LAB — VITAMIN D 25 HYDROXY (VIT D DEFICIENCY, FRACTURES): Vit D, 25-Hydroxy: 22.5 ng/mL — ABNORMAL LOW (ref 30.0–100.0)

## 2020-03-21 LAB — HIV ANTIBODY (ROUTINE TESTING W REFLEX): HIV Screen 4th Generation wRfx: NONREACTIVE

## 2020-03-21 NOTE — Telephone Encounter (Signed)
hcv Virginia Woods is ok

## 2020-03-21 NOTE — Telephone Encounter (Signed)
Copied from [place] [id number]. Topic: General - Inquiry >> Mar 21, 2020  8:09 AM Scherrie Gerlach wrote: Reason for CRM: pt and daughter Virginia Woods were there to see Sonia Baller yesterday, 2 pm appt.  Alyssa thinks she left her Air Pods earbuds there on the couch. Did anyone find? Please call back

## 2020-03-21 NOTE — Telephone Encounter (Signed)
-----   Message from Mar Daring, [place] sent at 03/21/2020 10:17 AM EDT ----- Kidney and liver function are normal. Sodium, potassium, and calcium are normal. Cholesterol is normal. Thyroid is normal. Blood count is stable. A1c/sugar is normal. HIV screen done once in a lifetime, unless exposed, is negative. Vit D is borderline low at 22.5. Would recommend daily OTC Vit D 2000 IU. Hep C is pending.

## 2020-03-21 NOTE — Telephone Encounter (Signed)
Patient advised as directed below. 

## 2020-03-21 NOTE — Telephone Encounter (Signed)
Copied from [place] [phone]. Topic: General - Other >> Mar 21, 2020 12:22 PM Oneta Rack wrote:  Otila Kluver from [facility] [phone] ext [phone]    Reason for CRM: the following order is no longer "orderable" test  # [id number] HCV Antibody with reflex verification.   Representative wanted PCP to be aware they do have the following test quantitative or quad hcv

## 2020-03-21 NOTE — Telephone Encounter (Signed)
Spoke to [place] from [place] to add the HCV Quant.

## 2020-03-21 NOTE — Telephone Encounter (Signed)
Please see other CRM

## 2020-03-25 ENCOUNTER — Telehealth: Payer: Self-pay | Admitting: Physician Assistant

## 2020-03-25 DIAGNOSIS — Q0700 Arnold-Chiari syndrome without spina bifida or hydrocephalus: Secondary | ICD-10-CM

## 2020-03-26 ENCOUNTER — Other Ambulatory Visit: Payer: Self-pay

## 2020-03-26 DIAGNOSIS — F331 Major depressive disorder, recurrent, moderate: Secondary | ICD-10-CM

## 2020-03-26 DIAGNOSIS — F419 Anxiety disorder, unspecified: Secondary | ICD-10-CM

## 2020-03-26 DIAGNOSIS — J40 Bronchitis, not specified as acute or chronic: Secondary | ICD-10-CM

## 2020-03-26 DIAGNOSIS — R1319 Other dysphagia: Secondary | ICD-10-CM

## 2020-03-26 DIAGNOSIS — M5413 Radiculopathy, cervicothoracic region: Secondary | ICD-10-CM

## 2020-03-26 DIAGNOSIS — Q0700 Arnold-Chiari syndrome without spina bifida or hydrocephalus: Secondary | ICD-10-CM

## 2020-03-26 LAB — CYTOLOGY - PAP
Comment: NEGATIVE
Diagnosis: NEGATIVE
High risk HPV: NEGATIVE

## 2020-03-26 LAB — HCV AB W REFLEX TO QUANT PCR: HCV Ab: <0.1 s/co ratio (ref 0.0–0.9)

## 2020-03-26 LAB — SPECIMEN STATUS REPORT

## 2020-03-26 LAB — HCV INTERPRETATION

## 2020-03-26 MED ORDER — CITALOPRAM HYDROBROMIDE 40 MG PO TABS: 40.0000 mg | ORAL_TABLET | Freq: Every day | ORAL | 1 refills | Status: DC

## 2020-03-26 MED ORDER — PROPRANOLOL HCL 10 MG PO TABS: 10.0000 mg | ORAL_TABLET | Freq: Four times a day (QID) | ORAL | Status: DC | PRN

## 2020-03-26 MED ORDER — GABAPENTIN 300 MG PO CAPS: 300.0000 mg | ORAL_CAPSULE | Freq: Two times a day (BID) | ORAL | 1 refills | Status: DC

## 2020-03-26 MED ORDER — METOPROLOL SUCCINATE ER 25 MG PO TB24: 25.0000 mg | ORAL_TABLET | Freq: Every day | ORAL | 3 refills | Status: DC

## 2020-03-26 MED ORDER — ALBUTEROL SULFATE HFA 108 (90 BASE) MCG/ACT IN AERS: 2.0000 | INHALATION_SPRAY | Freq: Four times a day (QID) | RESPIRATORY_TRACT | 3 refills | Status: DC | PRN

## 2020-03-26 MED ORDER — ALPRAZOLAM 0.5 MG PO TABS: 0.5000 mg | ORAL_TABLET | Freq: Two times a day (BID) | ORAL | 1 refills | Status: DC | PRN

## 2020-03-26 MED ORDER — OMEPRAZOLE 40 MG PO CPDR: 40.0000 mg | DELAYED_RELEASE_CAPSULE | Freq: Two times a day (BID) | ORAL | 0 refills | Status: DC

## 2020-03-26 MED ORDER — CYCLOBENZAPRINE HCL 5 MG PO TABS
5.0000 mg | ORAL_TABLET | Freq: Three times a day (TID) | ORAL | 5 refills | Status: DC | PRN
Start: 2020-03-26 — End: 2024-01-24

## 2020-03-26 NOTE — Telephone Encounter (Signed)
Patient advised as directed below. Patient requesting refill on all her medicines to be send to [facility]

## 2020-03-26 NOTE — Telephone Encounter (Signed)
refilled 

## 2020-03-26 NOTE — Telephone Encounter (Signed)
Pt called stating that she had an appt with PCP on 03/20/20 and in the appt she asked pcp to refill this medication. Please advise.

## 2020-03-26 NOTE — Addendum Note (Signed)
Addended by: Mar Daring on: 03/26/2020 03:42 PM   Modules accepted: Orders

## 2020-03-26 NOTE — Telephone Encounter (Signed)
-----   Message from Mar Daring, [place] sent at 03/26/2020 12:14 PM EDT ----- Pap is normal, HPV negative.  Will repeat in 5 years.

## 2020-03-28 ENCOUNTER — Telehealth: Payer: Self-pay | Admitting: Physician Assistant

## 2020-03-28 ENCOUNTER — Ambulatory Visit: Payer: Self-pay | Admitting: *Deleted

## 2020-03-28 NOTE — Telephone Encounter (Signed)
I attempted to return pt's call.   Left a voicemail for her to call the office back.

## 2020-03-28 NOTE — Telephone Encounter (Signed)
I attempted to call pt however got her voicemail.  Left a message to return the call to the office.

## 2020-03-28 NOTE — Telephone Encounter (Signed)
Copied from [place] [phone]. Topic: General - Other >> Mar 28, 2020  8:02 AM Leward Quan A wrote: Reason for CRM: Patient called to speak to a nurse have some questions surrounding covid. Asking for a call back please ASAP Ph# [phone]

## 2020-03-28 NOTE — Telephone Encounter (Signed)
Patient is scheduled for Covid testing on 03/31/20.

## 2020-03-30 DIAGNOSIS — F331 Major depressive disorder, recurrent, moderate: Secondary | ICD-10-CM | POA: Insufficient documentation

## 2020-03-31 ENCOUNTER — Other Ambulatory Visit: Payer: Federal, State, Local not specified - PPO

## 2020-03-31 ENCOUNTER — Ambulatory Visit: Payer: Federal, State, Local not specified - PPO | Attending: Internal Medicine

## 2020-03-31 DIAGNOSIS — Z20822 Contact with and (suspected) exposure to covid-19: Secondary | ICD-10-CM | POA: Diagnosis not present

## 2020-04-01 LAB — SARS-COV-2, NAA 2 DAY TAT

## 2020-04-01 LAB — NOVEL CORONAVIRUS, NAA: SARS-CoV-2, NAA: NOT DETECTED

## 2020-04-14 DIAGNOSIS — F411 Generalized anxiety disorder: Secondary | ICD-10-CM | POA: Diagnosis not present

## 2020-04-24 ENCOUNTER — Other Ambulatory Visit: Payer: Self-pay | Admitting: Physician Assistant

## 2020-04-24 DIAGNOSIS — R1319 Other dysphagia: Secondary | ICD-10-CM

## 2020-04-25 ENCOUNTER — Encounter: Payer: Self-pay | Admitting: Physician Assistant

## 2020-04-25 ENCOUNTER — Other Ambulatory Visit: Payer: Self-pay

## 2020-04-25 ENCOUNTER — Ambulatory Visit: Payer: Federal, State, Local not specified - PPO | Admitting: Physician Assistant

## 2020-04-25 VITALS — BP 130/85 | HR 83 | Temp 98.3°F | Resp 16 | Wt 223.2 lb

## 2020-04-25 DIAGNOSIS — M545 Low back pain, unspecified: Secondary | ICD-10-CM

## 2020-04-25 LAB — POCT URINALYSIS DIPSTICK
Bilirubin, UA: NEGATIVE
Glucose, UA: NEGATIVE
Ketones, UA: NEGATIVE
Leukocytes, UA: NEGATIVE
Nitrite, UA: NEGATIVE
Protein, UA: NEGATIVE
Spec Grav, UA: 1.02 (ref 1.010–1.025)
Urobilinogen, UA: 0.2 E.U./dL
pH, UA: 6.5 (ref 5.0–8.0)

## 2020-04-25 MED ORDER — KETOROLAC TROMETHAMINE 60 MG/2ML IM SOLN
60.0000 mg | Freq: Once | INTRAMUSCULAR | Status: AC
  Administered 2020-04-25: 60 mg via INTRAMUSCULAR

## 2020-04-25 MED ORDER — LIDOCAINE 5 % EX PTCH: 1.0000 | MEDICATED_PATCH | CUTANEOUS | 0 refills | Status: DC

## 2020-04-25 NOTE — Progress Notes (Signed)
Established patient visit   Patient: Virginia Woods   DOB: 1970/11/29   [name]  MRN: [id number] Visit Date: 04/25/2020  Today's healthcare provider: Mar Daring, PA-C   Chief Complaint  Patient presents with  . Back Pain   Subjective    Back Pain This is a new problem. The current episode started in the past 7 days (This is the third day). The problem occurs constantly. The problem has been gradually worsening since onset. The pain is present in the lumbar spine. The quality of the pain is described as aching (with movement is a sharp pain). The pain does not radiate. The pain is at a severity of 8/10. The pain is moderate. The symptoms are aggravated by bending, position and standing. Associated symptoms include abdominal pain ("lower abdomen on Friday" like something was trying to come through). Pertinent negatives include no dysuria, leg pain, numbness, tingling or weakness. She has tried muscle relaxant (GasX tablets, Tylenol ) for the symptoms. The treatment provided no relief.     Patient Active Problem List   Diagnosis Date Noted  . Moderate episode of recurrent major depressive disorder ([place]) 03/30/2020  . Dysphagia   . SVT (supraventricular tachycardia) ([place]) 04/10/2019  . Mobitz type 2 second degree heart block 03/19/2019  . Cervical radiculopathy 06/28/2018  . 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 Allergic rhinitis 05/20/2015  . Arnold-Chiari malformation ([place]) 05/20/2015  . Chronic headache 05/20/2015  . Chronic pain 05/20/2015  . Clinical depression 05/20/2015  . Dermatitis, eczematoid 05/20/2015  . Acute Lyme disease 05/20/2015  . Muscle ache 05/20/2015  . Apnea, sleep 05/20/2015  . B12 deficiency 05/20/2015  . Avitaminosis D 05/20/2015  . Hypo-osmolality and hyponatremia 10/13/2009  . Anemia, iron deficiency 05/16/2009  . Cannot sleep 04/18/2008  . Anxiety 11/05/2004   Past Medical History:  Diagnosis Date  . Anemia   .  Anxiety   . Complication of anesthesia    pt reports some complication after 2nd brain surgery, but unsure of what it was.  . Dental bridge present    Perrmanent dental retainer - bottom  . Depression   . Difficult intubation    with 2nd chiari surgery (per pt)  . Family history of adverse reaction to anesthesia    reports mother had a "negative reaction" to "anesthesia or lidocaine"  . Headache   . History of Chiari malformation 2011   2 surgeries 2011 [facility]  . Vitamin B12 deficiency   . Vitamin D deficiency   . Wears contact lenses        Medications: Outpatient Medications Prior to Visit  Medication Sig  . albuterol (VENTOLIN HFA) 108 (90 Base) MCG/ACT inhaler Inhale 2 puffs into the lungs every 6 (six) hours as needed for wheezing or shortness of breath.  . ALPRAZolam (XANAX) 0.5 MG tablet Take 1 tablet (0.5 mg total) by mouth 2 (two) times daily as needed.  . Ascorbic Acid (VITAMIN C PO) Take by mouth.  . citalopram (CELEXA) 40 MG tablet Take 1 tablet (40 mg total) by mouth daily.  . Cyanocobalamin (VITAMIN B 12 PO) Take by mouth.  . cyclobenzaprine (FLEXERIL) 5 MG tablet Take 1 tablet (5 mg total) by mouth 3 (three) times daily as needed for muscle spasms.  . diphenhydrAMINE (BENADRYL) 25 MG tablet Take 25 mg by mouth at bedtime as needed for sleep.  Marland Kitchen gabapentin (NEURONTIN) 300 MG capsule Take 1 capsule (300 mg total) by mouth 2 (two) times  daily.  . loratadine (CLARITIN) 10 MG tablet Take 10 mg by mouth daily as needed.   . metoprolol succinate (TOPROL-XL) 25 MG 24 hr tablet Take 1 tablet (25 mg total) by mouth daily.  . OMEGA-3 FATTY ACIDS PO   . omeprazole (PRILOSEC) 40 MG capsule TAKE 1 CAPSULE (40 MG TOTAL) BY MOUTH 2 (TWO) TIMES DAILY BEFORE A MEAL.  Marland Kitchen propranolol (INDERAL) 10 MG tablet Take 1 tablet (10 mg total) by mouth 4 (four) times daily as needed (palpitations).  . Vitamin D, Ergocalciferol, (DRISDOL) 1.25 MG (50000 UT) CAPS capsule TAKE ONE CAPSULE BY MOUTH  EVERY 7 DAYS   No facility-administered medications prior to visit.    Review of Systems  Constitutional: Negative.   Respiratory: Negative.   Cardiovascular: Negative.   Gastrointestinal: Positive for abdominal pain ("lower abdomen on Friday" like something was trying to come through).  Genitourinary: Negative for dysuria.  Musculoskeletal: Positive for back pain and myalgias. Negative for gait problem.  Neurological: Negative for tingling, weakness and numbness.    Last CBC Lab Results  Component Value Date   WBC 4.7 03/20/2020   HGB 12.3 03/20/2020   HCT 40.6 03/20/2020   MCV 76 (L) 03/20/2020   MCH 23.0 (L) 03/20/2020   RDW 14.0 03/20/2020   PLT 266 40/34/7425   Last metabolic panel Lab Results  Component Value Date   GLUCOSE 76 03/20/2020   NA 139 03/20/2020   K 4.2 03/20/2020   CL 100 03/20/2020   CO2 26 03/20/2020   BUN 9 03/20/2020   CREATININE 0.80 03/20/2020   GFRNONAA 87 03/20/2020   GFRAA 100 03/20/2020   CALCIUM 9.5 03/20/2020   PROT 6.9 03/20/2020   ALBUMIN 4.4 03/20/2020   LABGLOB 2.5 03/20/2020   AGRATIO 1.8 03/20/2020   BILITOT 0.4 03/20/2020   ALKPHOS 73 03/20/2020   AST 17 03/20/2020   ALT 14 03/20/2020   ANIONGAP 8 01/04/2016      Objective    BP 130/85 (BP Location: Left Arm, Patient Position: Sitting, Cuff Size: Large)   Pulse 83   Temp 98.3 F (36.8 C) (Oral)   Resp 16   Wt 223 lb 3.2 oz (101.2 kg)   LMP 02/24/2020   BMI 37.14 kg/m  BP Readings from Last 3 Encounters:  04/25/20 130/85  03/20/20 114/78  10/18/19 116/76   Wt Readings from Last 3 Encounters:  04/25/20 223 lb 3.2 oz (101.2 kg)  03/20/20 216 lb (98 kg)  10/18/19 217 lb (98.4 kg)      Physical Exam Vitals reviewed.  Constitutional:      General: She is not in acute distress.    Appearance: Normal appearance. She is well-developed. She is obese. She is not ill-appearing.  HENT:     Head: Normocephalic and atraumatic.  Pulmonary:     Effort: Pulmonary  effort is normal. No respiratory distress.  Musculoskeletal:     Cervical back: Normal range of motion and neck supple.     Thoracic back: Normal.     Lumbar back: Tenderness (bilateral muscle tenderness to touch) and bony tenderness (tender over left SI joint) present. No swelling, deformity, signs of trauma or spasms. Decreased range of motion. Negative right straight leg raise test and negative left straight leg raise test.  Neurological:     Mental Status: She is alert.  Psychiatric:        Mood and Affect: Mood normal.        Behavior: Behavior normal.  Thought Content: Thought content normal.        Judgment: Judgment normal.       Results for orders placed or performed in visit on 04/25/20  POCT Urinalysis Dipstick  Result Value Ref Range   Color, UA light Yellow    Clarity, UA Clear    Glucose, UA Negative Negative   Bilirubin, UA Negative    Ketones, UA Negative    Spec Grav, UA 1.020 1.010 - 1.025   Blood, UA Non-Hemolyzed Moderate    pH, UA 6.5 5.0 - 8.0   Protein, UA Negative Negative   Urobilinogen, UA 0.2 0.2 or 1.0 E.U./dL   Nitrite, UA Negative    Leukocytes, UA Negative Negative   Appearance     Odor      Assessment & Plan     1. Acute left-sided low back pain without sciatica UA normal. Will get imaging as below. Will f/u pending results. Toradol IM 60mg  given today in the office. Lidocaine patches provided as below. Patient has Ibuprofen and flexeril at home. May apply ice. Epsom salt soaks can help. Back exercises and stretches provided on AVS. Will f/u pending imaging and if pain does not improve.  - DG Lumbar Spine Complete; Future - DG Si Joints; Future - ketorolac (TORADOL) injection 60 mg - lidocaine (LIDODERM) 5 %; Place 1 patch onto the skin daily. Remove & Discard patch within 12 hours or as directed by MD  Dispense: 30 patch; Refill: 0   No follow-ups on file.      Reynolds Bowl, PA-C, have reviewed all documentation for this  visit. The documentation on 04/27/20 for the exam, diagnosis, procedures, and orders are all accurate and complete.   [place]ye [place]  [facility] [phone] (phone) [phone] (fax)  [place]

## 2020-04-25 NOTE — Patient Instructions (Signed)
Acute Back Pain, Adult Acute back pain is sudden and usually short-lived. It is often caused by an injury to the muscles and tissues in the back. The injury may result from:  A muscle or ligament getting overstretched or torn (strained). Ligaments are tissues that connect bones to each other. Lifting something improperly can cause a back strain.  Wear and tear (degeneration) of the spinal disks. Spinal disks are circular tissue that provides cushioning between the bones of the spine (vertebrae).  Twisting motions, such as while playing sports or doing yard work.  A hit to the back.  Arthritis. You may have a physical exam, lab tests, and imaging tests to find the cause of your pain. Acute back pain usually goes away with rest and home care. Follow these instructions at home: Managing pain, stiffness, and swelling  Take over-the-counter and prescription medicines only as told by your health care provider.  Your health care provider may recommend applying ice during the first 24-48 hours after your pain starts. To do this: ? Put ice in a plastic bag. ? Place a towel between your skin and the bag. ? Leave the ice on for 20 minutes, 2-3 times a day.  If directed, apply heat to the affected area as often as told by your health care provider. Use the heat source that your health care provider recommends, such as a moist heat pack or a heating pad. ? Place a towel between your skin and the heat source. ? Leave the heat on for 20-30 minutes. ? Remove the heat if your skin turns bright red. This is especially important if you are unable to feel pain, heat, or cold. You have a greater risk of getting burned. Activity   Do not stay in bed. Staying in bed for more than 1-2 days can delay your recovery.  Sit up and stand up straight. Avoid leaning forward when you sit, or hunching over when you stand. ? If you work at a desk, sit close to it so you do not need to lean over. Keep your chin tucked  in. Keep your neck drawn back, and keep your elbows bent at a right angle. Your arms should look like the letter "L." ? Sit high and close to the steering wheel when you drive. Add lower back (lumbar) support to your car seat, if needed.  Take short walks on even surfaces as soon as you are able. Try to increase the length of time you walk each day.  Do not sit, drive, or stand in one place for more than 30 minutes at a time. Sitting or standing for long periods of time can put stress on your back.  Do not drive or use heavy machinery while taking prescription pain medicine.  Use proper lifting techniques. When you bend and lift, use positions that put less stress on your back: ? Bend your knees. ? Keep the load close to your body. ? Avoid twisting.  Exercise regularly as told by your health care provider. Exercising helps your back heal faster and helps prevent back injuries by keeping muscles strong and flexible.  Work with a physical therapist to make a safe exercise program, as recommended by your health care provider. Do any exercises as told by your physical therapist. Lifestyle  Maintain a healthy weight. Extra weight puts stress on your back and makes it difficult to have good posture.  Avoid activities or situations that make you feel anxious or stressed. Stress and anxiety increase muscle   tension and can make back pain worse. Learn ways to manage anxiety and stress, such as through exercise. General instructions  Sleep on a firm mattress in a comfortable position. Try lying on your side with your knees slightly bent. If you lie on your back, put a pillow under your knees.  Follow your treatment plan as told by your health care provider. This may include: ? Cognitive or behavioral therapy. ? Acupuncture or massage therapy. ? Meditation or yoga. Contact a health care provider if:  You have pain that is not relieved with rest or medicine.  You have increasing pain going down  into your legs or buttocks.  Your pain does not improve after 2 weeks.  You have pain at night.  You lose weight without trying.  You have a fever or chills. Get help right away if:  You develop new bowel or bladder control problems.  You have unusual weakness or numbness in your arms or legs.  You develop nausea or vomiting.  You develop abdominal pain.  You feel faint. Summary  Acute back pain is sudden and usually short-lived.  Use proper lifting techniques. When you bend and lift, use positions that put less stress on your back.  Take over-the-counter and prescription medicines and apply heat or ice as directed by your health care provider. This information is not intended to replace advice given to you by your health care provider. Make sure you discuss any questions you have with your health care provider. Document Revised: 12/26/2018 Document Reviewed: 04/20/2017 Elsevier Patient Education  2020 [employer] Inc.  

## 2020-04-27 ENCOUNTER — Encounter: Payer: Self-pay | Admitting: Physician Assistant

## 2020-04-28 ENCOUNTER — Telehealth: Payer: Self-pay

## 2020-04-28 ENCOUNTER — Ambulatory Visit
Admission: RE | Admit: 2020-04-28 | Discharge: 2020-04-28 | Disposition: A | Discharge status: Home / Self Care | Payer: Federal, State, Local not specified - PPO | Source: Ambulatory Visit | Attending: Physician Assistant | Admitting: Physician Assistant

## 2020-04-28 ENCOUNTER — Ambulatory Visit
Admission: RE | Admit: 2020-04-28 | Discharge: 2020-04-28 | Disposition: A | Discharge status: Home / Self Care | Payer: Federal, State, Local not specified - PPO | Attending: Physician Assistant | Admitting: Physician Assistant

## 2020-04-28 ENCOUNTER — Other Ambulatory Visit: Payer: Self-pay

## 2020-04-28 DIAGNOSIS — M545 Low back pain, unspecified: Secondary | ICD-10-CM

## 2020-04-28 DIAGNOSIS — M533 Sacrococcygeal disorders, not elsewhere classified: Secondary | ICD-10-CM | POA: Diagnosis not present

## 2020-04-28 MED ORDER — MELOXICAM 7.5 MG PO TABS: 7.5000 mg | ORAL_TABLET | Freq: Every day | ORAL | 0 refills | Status: DC

## 2020-04-28 NOTE — Telephone Encounter (Signed)
Advised patient of xray results as below. Patient reports that she has been using the patch and cyclobenzaprine for back pain and states that it is not helping. Patient reports pain when moving or walking. Patient has questions about what exact arthritic changes are noted on report? Patient states that she has not had a previous x-ray before and wants to know how image can tell there has been arthritic changes if it hasnt been noted before. Patient states that she wants to know your plan for her since she is still in pain.KW

## 2020-04-28 NOTE — Telephone Encounter (Signed)
-----   Message from Mar Daring, [place] sent at 04/28/2020  9:18 AM EDT ----- There is arthritic changes noted in the lower spine. Disc space is maintained. No changes in alignment to the spine.

## 2020-04-28 NOTE — Telephone Encounter (Signed)
When looking at arthritic changes on an xray the joints become fuzzy in appearance and you can also see bone spurs and sharp edges that show the bones are rubbing together. You do not see that clear joint space.   Since pain is still present I would refer to orthopedic spine.  May continue Flexeril as needed, continue lidocaine patches. Will send in Meloxicam 7.5mg . This is a NSAID (non-steroidal anti-inflammatory drug). This is a little better than ibuprofen and aleve. Take once daily with food.

## 2020-04-28 NOTE — Telephone Encounter (Signed)
-----   Message from Mar Daring, PA-C sent at 04/28/2020  9:15 AM EDT ----- SI joints and hip joints are normal.

## 2020-04-28 NOTE — Telephone Encounter (Signed)
Patient advised.

## 2020-04-29 DIAGNOSIS — M545 Low back pain: Secondary | ICD-10-CM | POA: Diagnosis not present

## 2020-05-01 ENCOUNTER — Telehealth: Payer: Self-pay

## 2020-05-01 DIAGNOSIS — M545 Low back pain: Secondary | ICD-10-CM | POA: Diagnosis not present

## 2020-05-01 NOTE — Telephone Encounter (Signed)
Patient is requesting a handicap placard be completed for back pain. She was seen at Ortho this morning. Please advise. CB# [phone]

## 2020-05-01 NOTE — Telephone Encounter (Signed)
Copied from [place] [phone]. Topic: General - Other >> May 01, 2020  9:25 AM Celene Kras wrote: Reason for CRM: Pt called and is requesting to speak with PCP regarding her back pain and possibly getting a handicap placard. Please advise.

## 2020-05-02 NOTE — Telephone Encounter (Signed)
Completed.   She can pick up at her convenience.

## 2020-05-02 NOTE — Telephone Encounter (Signed)
Patient advised.

## 2020-05-13 ENCOUNTER — Encounter: Payer: Self-pay | Admitting: Physician Assistant

## 2020-05-18 ENCOUNTER — Other Ambulatory Visit: Payer: Self-pay | Admitting: Physician Assistant

## 2020-05-18 DIAGNOSIS — R1319 Other dysphagia: Secondary | ICD-10-CM

## 2020-05-21 ENCOUNTER — Other Ambulatory Visit: Payer: Self-pay | Admitting: Physician Assistant

## 2020-05-21 DIAGNOSIS — M545 Low back pain, unspecified: Secondary | ICD-10-CM

## 2020-05-21 NOTE — Telephone Encounter (Signed)
Requested  medications are  due for refill today yes  Requested medications are on the active medication list yes  Last refill 8/9  Notes to clinic 8/24 note is asking pt if she needs this med increased to twice daily, please assess.

## 2020-05-24 ENCOUNTER — Encounter: Payer: Self-pay | Admitting: Physician Assistant

## 2020-06-01 ENCOUNTER — Other Ambulatory Visit: Payer: Self-pay | Admitting: Physician Assistant

## 2020-06-01 DIAGNOSIS — R1319 Other dysphagia: Secondary | ICD-10-CM

## 2020-06-01 NOTE — Telephone Encounter (Signed)
Requested Prescriptions  Pending Prescriptions Disp Refills   omeprazole (PRILOSEC) 40 MG capsule [Pharmacy Med Name: OMEPRAZOLE DR 40 MG CAPSULE] 180 capsule 1    Sig: TAKE 1 CAPSULE (40 MG TOTAL) BY MOUTH 2 (TWO) TIMES DAILY BEFORE A MEAL.     Gastroenterology: Proton Pump Inhibitors Passed - 06/01/2020  4:32 PM      Passed - Valid encounter within last 12 months    Recent Outpatient Visits          1 month ago Acute left-sided low back pain without sciatica   [place], [place]   2 months ago Routine general medical examination at a health care facility   [facility] [place], [place]   10 months ago Suspected COVID-19 virus infection   [place], [place]   1 year ago Swelling of pharynx   [place], [place]   1 year ago Iron deficiency anemia secondary to inadequate dietary iron intake   [facility] [place], [place]

## 2020-06-27 ENCOUNTER — Other Ambulatory Visit: Payer: Self-pay | Admitting: Cardiovascular Disease

## 2020-07-16 DIAGNOSIS — Z20822 Contact with and (suspected) exposure to covid-19: Secondary | ICD-10-CM | POA: Diagnosis not present

## 2020-07-16 DIAGNOSIS — Z03818 Encounter for observation for suspected exposure to other biological agents ruled out: Secondary | ICD-10-CM | POA: Diagnosis not present

## 2020-07-17 ENCOUNTER — Telehealth (INDEPENDENT_AMBULATORY_CARE_PROVIDER_SITE_OTHER): Payer: Federal, State, Local not specified - PPO | Admitting: Adult Health

## 2020-07-17 ENCOUNTER — Encounter: Payer: Self-pay | Admitting: Adult Health

## 2020-07-17 DIAGNOSIS — J029 Acute pharyngitis, unspecified: Secondary | ICD-10-CM | POA: Diagnosis not present

## 2020-07-17 DIAGNOSIS — R1310 Dysphagia, unspecified: Secondary | ICD-10-CM | POA: Diagnosis not present

## 2020-07-17 MED ORDER — PREDNISOLONE 5 MG (21) PO TBPK: ORAL_TABLET | ORAL | 0 refills | Status: DC

## 2020-07-17 MED ORDER — AMOXICILLIN-POT CLAVULANATE 875-125 MG PO TABS: 1.0000 | ORAL_TABLET | Freq: Two times a day (BID) | ORAL | 0 refills | Status: DC

## 2020-07-17 NOTE — Progress Notes (Addendum)
Allergies  Allergen Reactions  . Doxycycline Itching and Rash  . Tamiflu [Oseltamivir Phosphate] Rash    MyChart Video Visit    Virtual Visit via Video Note   This visit type was conducted due to national recommendations for restrictions regarding the COVID-19 Pandemic (e.g. social distancing) in an effort to limit this patient's exposure and mitigate transmission in our community. This patient is at least at moderate risk for complications without adequate follow up. This format is felt to be most appropriate for this patient at this time. Physical exam was limited by quality of the video and audio technology used for the visit.   Patient location: at home  Provider location: Provider: Provider's office at  [facility], [place].     I discussed the limitations of evaluation and management by telemedicine and the availability of in person appointments. The patient expressed understanding and agreed to proceed.  Patient: Virginia Woods   DOB: [birth year]   [name]  MRN: [id number] Visit Date: 07/17/2020  Today's healthcare provider: Marcille Buffy, FNP   No chief complaint on file.  Subjective    Fever  This is a new problem. The current episode started 1 to 4 weeks ago (3 days). The problem occurs intermittently. The problem has been unchanged. The maximum temperature noted was 99 to 99.9 F. The temperature was taken using an oral thermometer. Associated symptoms include headaches, nausea (occasionally ) and a sore throat. Pertinent negatives include no abdominal pain, chest pain, congestion, coughing, diarrhea, ear pain, muscle aches, rash, sleepiness, urinary pain, vomiting or wheezing. She has tried acetaminophen for the symptoms. The treatment provided mild relief.  Risk factors: no recent sickness     Onset 07/08/2020- temperature 99.9. increased  Patient also mentions that she was tested for covid yesterday and it was negative.  She  reports she has throat pain and tonsils are swollen. She has very painful swallowing.  No significant congestion.  No cough.  No shortness of breath.  No loss of taste or smell.  Painful swallowing, able to swallow.  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 She has had strep around 2 years ago.   Her grandchildren age 18 and 72 had colds when they came over.   Vaccinated 2 doses last dose was less than 6 months ago.   Patient  denies any, body aches,chills, rash, chest pain, shortness of breath, nausea, vomiting, or diarrhea.     Medications: Outpatient Medications Prior to Visit  Medication Sig  . albuterol (VENTOLIN HFA) 108 (90 Base) MCG/ACT inhaler Inhale 2 puffs into the lungs every 6 (six) hours as needed for wheezing or shortness of breath.  . ALPRAZolam (XANAX) 0.5 MG tablet Take 1 tablet (0.5 mg total) by mouth 2 (two) times daily as needed.  . Ascorbic Acid (VITAMIN C PO) Take by mouth.  . citalopram (CELEXA) 40 MG tablet Take 1 tablet (40 mg total) by mouth daily.  . Cyanocobalamin (VITAMIN B 12 PO) Take by mouth.  . cyclobenzaprine (FLEXERIL) 5 MG tablet Take 1 tablet (5 mg total) by mouth 3 (three) times daily as needed for muscle spasms.  . diphenhydrAMINE (BENADRYL) 25 MG tablet Take 25 mg by mouth at bedtime as needed for sleep.  Marland Kitchen gabapentin (NEURONTIN) 300 MG capsule Take 1 capsule (300 mg total) by mouth 2 (two) times daily.  Marland Kitchen lidocaine (LIDODERM) 5 % Place 1 patch onto the skin daily.  Remove & Discard patch within 12 hours or as directed by MD  . loratadine (CLARITIN) 10 MG tablet Take 10 mg by mouth daily as needed.   . meloxicam (MOBIC) 7.5 MG tablet TAKE 1 TABLET BY MOUTH EVERY DAY  . metoprolol succinate (TOPROL-XL) 25 MG 24 hr tablet Take 1 tablet (25 mg total) by mouth daily.  . OMEGA-3 FATTY ACIDS PO   . omeprazole (PRILOSEC) 40 MG capsule TAKE 1 CAPSULE (40 MG TOTAL) BY MOUTH 2 (TWO) TIMES DAILY BEFORE A MEAL.  Marland Kitchen  propranolol (INDERAL) 10 MG tablet TAKE 1 TABLET (10 MG TOTAL) BY MOUTH 4 (FOUR) TIMES DAILY AS NEEDED (FAST HEART RATE/PALPITATIONS).  Marland Kitchen traMADol (ULTRAM) 50 MG tablet Take 50 mg by mouth every 6 (six) hours.  . Vitamin D, Ergocalciferol, (DRISDOL) 1.25 MG (50000 UT) CAPS capsule TAKE ONE CAPSULE BY MOUTH EVERY 7 DAYS  . [DISCONTINUED] pantoprazole (PROTONIX) 20 MG tablet pantoprazole 20 mg tablet,delayed release  TAKE 1 TABLET BY MOUTH 30 MINUTES PRIOR TO BREAKFAST AND AT BEDTIME   No facility-administered medications prior to visit.    Review of Systems  Constitutional: Positive for activity change, fatigue and fever. Negative for appetite change, chills, diaphoresis and unexpected weight change.  HENT: Positive for sore throat. Negative for congestion, ear pain, postnasal drip, trouble swallowing (painful swallowing. ) and voice change.   Respiratory: Negative.  Negative for cough, shortness of breath and wheezing.   Cardiovascular: Negative.  Negative for chest pain and palpitations.  Gastrointestinal: Positive for nausea (occasionally ). Negative for abdominal distention, abdominal pain, anal bleeding, blood in stool, constipation, diarrhea and vomiting.  Genitourinary: Negative.  Negative for dysuria.  Musculoskeletal: Negative.  Negative for myalgias.  Skin: Negative.  Negative for rash.  Neurological: Positive for headaches.  Psychiatric/Behavioral: Negative.       Objective    There were no vitals taken for this visit.   Physical Exam    Patient is alert and oriented and responsive to questions Engages in conversation with provider. Speaks in full sentences without any pauses without any shortness of breath or distress.   Assessment & Plan     Pharyngitis, unspecified etiology - Plan: amoxicillin-clavulanate (AUGMENTIN) 875-125 MG tablet, COVID-19, Flu A+B and RSV, Culture, Group A Strep, prednisoLONE 5 MG (21) TBPK  Painful swallowing - Plan: prednisoLONE 5 MG (21)  TBPK  Symptom management reviewed.   Red Flags discussed. The patient was given clear instructions to go to ER or return to medical center if any red flags develop, symptoms do not improve, worsen or new problems develop. They verbalized understanding.   Return in about 1 week (around 07/24/2020), or if symptoms worsen or fail to improve, for at any time for any worsening symptoms, Go to Emergency room/ urgent care if worse.     I discussed the assessment and treatment plan with the patient. The patient was provided an opportunity to ask questions and all were answered. The patient agreed with the plan and demonstrated an understanding of the instructions.   The patient was advised to call back or seek an in-person evaluation if the symptoms worsen or if the condition fails to improve as anticipated.  I provided 25  minutes of non-face-to-face time during this encounter.   Marcille Buffy, [place] [phone] (phone) [phone] (fax)  [place]

## 2020-07-18 ENCOUNTER — Encounter: Payer: Self-pay | Admitting: Adult Health

## 2020-07-18 NOTE — Patient Instructions (Signed)
Pharyngitis  Pharyngitis is a sore throat (pharynx). This is when there is redness, pain, and swelling in your throat. Most of the time, this condition gets better on its own. In some cases, you may need medicine. Follow these instructions at home:  Take over-the-counter and prescription medicines only as told by your doctor. ? If you were prescribed an antibiotic medicine, take it as told by your doctor. Do not stop taking the antibiotic even if you start to feel better. ? Do not give children aspirin. Aspirin has been linked to Reye syndrome.  Drink enough water and fluids to keep your pee (urine) clear or pale yellow.  Get a lot of rest.  Rinse your mouth (gargle) with a salt-water mixture 3-4 times a day or as needed. To make a salt-water mixture, completely dissolve -1 tsp of salt in 1 cup of warm water.  If your doctor approves, you may use throat lozenges or sprays to soothe your throat. Contact a doctor if:  You have large, tender lumps in your neck.  You have a rash.  You cough up green, yellow-brown, or bloody spit. Get help right away if:  You have a stiff neck.  You drool or cannot swallow liquids.  You cannot drink or take medicines without throwing up.  You have very bad pain that does not go away with medicine.  You have problems breathing, and it is not from a stuffy nose.  You have new pain and swelling in your knees, ankles, wrists, or elbows. Summary  Pharyngitis is a sore throat (pharynx). This is when there is redness, pain, and swelling in your throat.  If you were prescribed an antibiotic medicine, take it as told by your doctor. Do not stop taking the antibiotic even if you start to feel better.  Most of the time, pharyngitis gets better on its own. Sometimes, you may need medicine. This information is not intended to replace advice given to you by your health care provider. Make sure you discuss any questions you have with your health care  provider. Document Revised: 08/19/2017 Document Reviewed: 10/12/2016 Elsevier Patient Education  2020 [employer].

## 2020-07-19 LAB — COVID-19, FLU A+B AND RSV
Influenza A, NAA: NOT DETECTED
Influenza B, NAA: NOT DETECTED
RSV, NAA: NOT DETECTED
SARS-CoV-2, NAA: NOT DETECTED

## 2020-07-20 LAB — CULTURE, GROUP A STREP: Strep A Culture: NEGATIVE

## 2020-07-21 ENCOUNTER — Telehealth: Payer: Self-pay

## 2020-07-21 NOTE — Progress Notes (Signed)
Negative strep culture.

## 2020-07-21 NOTE — Telephone Encounter (Signed)
Copied from [place] [id number]. Topic: General - Other >> Jul 18, 2020 10:19 AM Hinda Lenis D wrote: PT had an appt yesterday, requesting a work note, she also send a my chat message

## 2020-07-22 NOTE — Telephone Encounter (Signed)
PT returning call / she was avalible to found note in my chart

## 2020-08-20 ENCOUNTER — Other Ambulatory Visit: Payer: Self-pay

## 2020-08-20 ENCOUNTER — Ambulatory Visit (INDEPENDENT_AMBULATORY_CARE_PROVIDER_SITE_OTHER): Payer: Federal, State, Local not specified - PPO | Admitting: Physician Assistant

## 2020-08-20 ENCOUNTER — Encounter: Payer: Self-pay | Admitting: Physician Assistant

## 2020-08-20 VITALS — BP 128/78 | HR 83 | Temp 98.9°F | Resp 16 | Wt 215.9 lb

## 2020-08-20 DIAGNOSIS — L304 Erythema intertrigo: Secondary | ICD-10-CM

## 2020-08-20 DIAGNOSIS — N898 Other specified noninflammatory disorders of vagina: Secondary | ICD-10-CM | POA: Diagnosis not present

## 2020-08-20 DIAGNOSIS — T7840XA Allergy, unspecified, initial encounter: Secondary | ICD-10-CM

## 2020-08-20 MED ORDER — TRIAMCINOLONE ACETONIDE 0.1 % EX CREA: 1.0000 "application " | TOPICAL_CREAM | Freq: Two times a day (BID) | CUTANEOUS | 0 refills | Status: DC

## 2020-08-20 MED ORDER — NYSTATIN 100000 UNIT/GM EX POWD: 1.0000 "application " | Freq: Three times a day (TID) | CUTANEOUS | 0 refills | Status: AC

## 2020-08-20 NOTE — Progress Notes (Signed)
Established patient visit   Patient: Virginia Woods   DOB: [birth year]   [name]  MRN: [id number] Visit Date: 08/20/2020  Today's healthcare provider: Mar Daring, PA-C   Chief Complaint  Patient presents with  . Rash   Subjective    Rash This is a new problem. The problem is unchanged. The affected locations include the right axilla and left axilla. The rash is characterized by itchiness, dryness and redness. Associated with: change deoderant. Treatments tried: some cream given to her by Dermatology desonide for contact dermatitis. The treatment provided mild relief.   Had associated lip swelling.   Patient Active Problem List   Diagnosis Date Noted  . Moderate episode of recurrent major depressive disorder ([place]) 03/30/2020  . Dysphagia   . SVT (supraventricular tachycardia) ([place]) 04/10/2019  . Mobitz type 2 second degree heart block 03/19/2019  . Cervical radiculopathy 06/28/2018  . 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 Allergic rhinitis 05/20/2015  . Arnold-Chiari malformation ([place]) 05/20/2015  . Chronic headache 05/20/2015  . Chronic pain 05/20/2015  . Clinical depression 05/20/2015  . Dermatitis, eczematoid 05/20/2015  . Acute Lyme disease 05/20/2015  . Muscle ache 05/20/2015  . Apnea, sleep 05/20/2015  . B12 deficiency 05/20/2015  . Avitaminosis D 05/20/2015  . Hypo-osmolality and hyponatremia 10/13/2009  . Anemia, iron deficiency 05/16/2009  . Cannot sleep 04/18/2008  . Anxiety 11/05/2004   Past Medical History:  Diagnosis Date  . Anemia   . Anxiety   . Complication of anesthesia    pt reports some complication after 2nd brain surgery, but unsure of what it was.  . Dental bridge present    Perrmanent dental retainer - bottom  . Depression   . Difficult intubation    with 2nd chiari surgery (per pt)  . Family history of adverse reaction to anesthesia    reports mother had a "negative reaction" to "anesthesia or lidocaine"  .  Headache   . History of Chiari malformation 2011   2 surgeries 2011 [facility]  . Vitamin B12 deficiency   . Vitamin D deficiency   . Wears contact lenses        Medications: Outpatient Medications Prior to Visit  Medication Sig  . albuterol (VENTOLIN HFA) 108 (90 Base) MCG/ACT inhaler Inhale 2 puffs into the lungs every 6 (six) hours as needed for wheezing or shortness of breath.  . ALPRAZolam (XANAX) 0.5 MG tablet Take 1 tablet (0.5 mg total) by mouth 2 (two) times daily as needed.  Marland Kitchen amoxicillin-clavulanate (AUGMENTIN) 875-125 MG tablet Take 1 tablet by mouth 2 (two) times daily.  . Ascorbic Acid (VITAMIN C PO) Take by mouth.  . citalopram (CELEXA) 40 MG tablet Take 1 tablet (40 mg total) by mouth daily.  . Cyanocobalamin (VITAMIN B 12 PO) Take by mouth.  . cyclobenzaprine (FLEXERIL) 5 MG tablet Take 1 tablet (5 mg total) by mouth 3 (three) times daily as needed for muscle spasms.  . diphenhydrAMINE (BENADRYL) 25 MG tablet Take 25 mg by mouth at bedtime as needed for sleep.  Marland Kitchen gabapentin (NEURONTIN) 300 MG capsule Take 1 capsule (300 mg total) by mouth 2 (two) times daily.  Marland Kitchen lidocaine (LIDODERM) 5 % Place 1 patch onto the skin daily. Remove & Discard patch within 12 hours or as directed by MD  . loratadine (CLARITIN) 10 MG tablet Take 10 mg by mouth daily as needed.   . meloxicam (MOBIC) 7.5 MG tablet TAKE 1 TABLET BY MOUTH EVERY DAY  .  metoprolol succinate (TOPROL-XL) 25 MG 24 hr tablet Take 1 tablet (25 mg total) by mouth daily.  . OMEGA-3 FATTY ACIDS PO   . omeprazole (PRILOSEC) 40 MG capsule TAKE 1 CAPSULE (40 MG TOTAL) BY MOUTH 2 (TWO) TIMES DAILY BEFORE A MEAL.  . prednisoLONE 5 MG (21) TBPK PO: Take 6 tablets on day 1:Take 5 tablets day 2:Take 4 tablets day 3: Take 3 tablets day 4:Take 2 tablets day five: 5 Take 1 tablet day 6  . propranolol (INDERAL) 10 MG tablet TAKE 1 TABLET (10 MG TOTAL) BY MOUTH 4 (FOUR) TIMES DAILY AS NEEDED (FAST HEART RATE/PALPITATIONS).  Marland Kitchen traMADol  (ULTRAM) 50 MG tablet Take 50 mg by mouth every 6 (six) hours.  . Vitamin D, Ergocalciferol, (DRISDOL) 1.25 MG (50000 UT) CAPS capsule TAKE ONE CAPSULE BY MOUTH EVERY 7 DAYS   No facility-administered medications prior to visit.    Review of Systems  Constitutional: Negative.   Respiratory: Negative.   Cardiovascular: Negative.   Skin: Positive for rash.  Allergic/Immunologic: Positive for environmental allergies and food allergies.    Last CBC Lab Results  Component Value Date   WBC 4.7 03/20/2020   HGB 12.3 03/20/2020   HCT 40.6 03/20/2020   MCV 76 (L) 03/20/2020   MCH 23.0 (L) 03/20/2020   RDW 14.0 03/20/2020   PLT 266 13/24/4010   Last metabolic panel Lab Results  Component Value Date   GLUCOSE 76 03/20/2020   NA 139 03/20/2020   K 4.2 03/20/2020   CL 100 03/20/2020   CO2 26 03/20/2020   BUN 9 03/20/2020   CREATININE 0.80 03/20/2020   GFRNONAA 87 03/20/2020   GFRAA 100 03/20/2020   CALCIUM 9.5 03/20/2020   PROT 6.9 03/20/2020   ALBUMIN 4.4 03/20/2020   LABGLOB 2.5 03/20/2020   AGRATIO 1.8 03/20/2020   BILITOT 0.4 03/20/2020   ALKPHOS 73 03/20/2020   AST 17 03/20/2020   ALT 14 03/20/2020   ANIONGAP 8 01/04/2016      Objective    There were no vitals taken for this visit. BP Readings from Last 3 Encounters:  08/20/20 128/78  04/25/20 130/85  03/20/20 114/78   Wt Readings from Last 3 Encounters:  08/20/20 215 lb 14.4 oz (97.9 kg)  04/25/20 223 lb 3.2 oz (101.2 kg)  03/20/20 216 lb (98 kg)      Physical Exam Vitals reviewed.  Constitutional:      General: She is not in acute distress.    Appearance: Normal appearance. She is well-developed. She is obese. She is not ill-appearing or diaphoretic.  Neck:     Thyroid: No thyromegaly.     Vascular: No JVD.     Trachea: No tracheal deviation.  Cardiovascular:     Rate and Rhythm: Normal rate and regular rhythm.     Pulses: Normal pulses.     Heart sounds: Normal heart sounds. No murmur heard.  No  friction rub. No gallop.   Pulmonary:     Effort: Pulmonary effort is normal. No respiratory distress.     Breath sounds: Normal breath sounds. No wheezing or rales.  Musculoskeletal:     Cervical back: Normal range of motion and neck supple.  Lymphadenopathy:     Cervical: No cervical adenopathy.  Skin:    General: Skin is warm.     Findings: Erythema and rash (darkened maculopapular rash in bilateral axilla) present.  Neurological:     Mental Status: She is alert.       No  results found for any visits on 08/20/20.  Assessment & Plan     1. Intertrigo Noted under breast. Use Nystatin in skin fold under breast as needed. Keep skin clean and dry.  - nystatin (MYCOSTATIN/NYSTOP) powder; Apply 1 application topically 3 (three) times daily.  Dispense: 15 g; Refill: 0  2. Allergic reaction, initial encounter Noted in axilla and suspected to be associated with new deodorant she is now not using. However, did have episode of lip swelling. Unsure if food related or if progression of allergic reaction. The lip swelling resolved with benadryl. She does have photos to confirm. Referral to allergist placed as below.  - triamcinolone (KENALOG) 0.1 %; Apply 1 application topically 2 (two) times daily.  Dispense: 30 g; Refill: 0 - Ambulatory referral to Allergy  3. Vaginal lesion Long standing issue. Previously referred and patient did not go. Lesions still present. New referral placed as patient is ready to proceed now.  - Ambulatory referral to Gynecology   No follow-ups on file.      Reynolds Bowl, PA-C, have reviewed all documentation for this visit. The documentation on 08/26/20 for the exam, diagnosis, procedures, and orders are all accurate and complete.   Rubye Beach  [facility] [phone] (phone) [phone] (fax)  [place]

## 2020-08-21 ENCOUNTER — Telehealth: Payer: Self-pay | Admitting: Obstetrics & Gynecology

## 2020-08-21 NOTE — Telephone Encounter (Signed)
[facility] referring for vaginal lesion.MD only. Called and left voicemail for patient to call back to be scheduled.

## 2020-08-26 ENCOUNTER — Encounter: Payer: Self-pay | Admitting: Physician Assistant

## 2020-08-28 NOTE — Telephone Encounter (Signed)
Called and left voicemail for patient to call back to be scheduled. 

## 2020-09-16 ENCOUNTER — Encounter: Payer: BC Managed Care – PPO | Admitting: Obstetrics and Gynecology

## 2020-09-18 ENCOUNTER — Ambulatory Visit (INDEPENDENT_AMBULATORY_CARE_PROVIDER_SITE_OTHER): Payer: Federal, State, Local not specified - PPO

## 2020-09-18 ENCOUNTER — Other Ambulatory Visit: Payer: Self-pay

## 2020-09-18 ENCOUNTER — Encounter: Payer: Self-pay | Admitting: Podiatry

## 2020-09-18 ENCOUNTER — Ambulatory Visit (INDEPENDENT_AMBULATORY_CARE_PROVIDER_SITE_OTHER): Payer: Federal, State, Local not specified - PPO | Admitting: Podiatry

## 2020-09-18 DIAGNOSIS — M2041 Other hammer toe(s) (acquired), right foot: Secondary | ICD-10-CM

## 2020-09-18 DIAGNOSIS — M2042 Other hammer toe(s) (acquired), left foot: Secondary | ICD-10-CM | POA: Diagnosis not present

## 2020-09-18 NOTE — Progress Notes (Signed)
Subjective:  Patient ID: Virginia Woods, female    DOB: [birth year],  MRN: [id number] HPI Chief Complaint  Patient presents with   Toe Pain    5th toes bilateral - tender for years, callused areas, trouble wearing shoes (rubbing), tries trimming, lotion, and cushion - no help   New Patient (Initial Visit)    49 y.o. female presents with the above complaint.   ROS: Denies fever chills nausea vomiting muscle aches pains calf pain back pain chest pain shortness of breath.  Past Medical History:  Diagnosis Date   Anemia    Anxiety    Complication of anesthesia    pt reports some complication after 2nd brain surgery, but unsure of what it was.   Dental bridge present    Perrmanent dental retainer - bottom   Depression    Difficult intubation    with 2nd chiari surgery (per pt)   Family history of adverse reaction to anesthesia    reports mother had a "negative reaction" to "anesthesia or lidocaine"   Headache    History of Chiari malformation 2011   2 surgeries 2011 - [facility]   Vitamin B12 deficiency    Vitamin D deficiency    Wears contact lenses    Past Surgical History:  Procedure Laterality Date   BRAIN SURGERY  2011   2 surgeries for chiari malformation - [facility]   CESAREAN SECTION  1996   COLONOSCOPY  2014   COLONOSCOPY WITH PROPOFOL N/A 06/20/2019   Procedure: COLONOSCOPY WITH PROPOFOL;  Surgeon: Toney Reil, MD;  Location: [facility];  Service: Endoscopy;  Laterality: N/A;   ESOPHAGOGASTRODUODENOSCOPY (EGD) WITH PROPOFOL N/A 06/20/2019   Procedure: ESOPHAGOGASTRODUODENOSCOPY (EGD) WITH PROPOFOL;  Surgeon: Toney Reil, MD;  Location: [facility];  Service: Endoscopy;  Laterality: N/A;   TUBAL LIGATION  2002    Current Outpatient Medications:    albuterol (VENTOLIN HFA) 108 (90 Base) MCG/ACT inhaler, Inhale 2 puffs into the lungs every 6 (six) hours as needed for wheezing or shortness of breath., Disp: 18 g, Rfl: 3    ALPRAZolam (XANAX) 0.5 MG tablet, Take 1 tablet (0.5 mg total) by mouth 2 (two) times daily as needed., Disp: 180 tablet, Rfl: 1   Ascorbic Acid (VITAMIN C PO), Take by mouth., Disp: , Rfl:    citalopram (CELEXA) 40 MG tablet, Take 1 tablet (40 mg total) by mouth daily., Disp: 90 tablet, Rfl: 1   Cyanocobalamin (VITAMIN B 12 PO), Take by mouth., Disp: , Rfl:    cyclobenzaprine (FLEXERIL) 5 MG tablet, Take 1 tablet (5 mg total) by mouth 3 (three) times daily as needed for muscle spasms., Disp: 30 tablet, Rfl: 5   diphenhydrAMINE (BENADRYL) 25 MG tablet, Take 25 mg by mouth at bedtime as needed for sleep., Disp: , Rfl:    gabapentin (NEURONTIN) 300 MG capsule, Take 1 capsule (300 mg total) by mouth 2 (two) times daily., Disp: 180 capsule, Rfl: 1   lidocaine (LIDODERM) 5 %, Place 1 patch onto the skin daily. Remove & Discard patch within 12 hours or as directed by MD, Disp: 30 patch, Rfl: 0   loratadine (CLARITIN) 10 MG tablet, Take 10 mg by mouth daily as needed. , Disp: , Rfl:    meloxicam (MOBIC) 7.5 MG tablet, TAKE 1 TABLET BY MOUTH EVERY DAY, Disp: 30 tablet, Rfl: 5   metoprolol succinate (TOPROL-XL) 25 MG 24 hr tablet, Take 1 tablet (25 mg total) by mouth daily., Disp: 90 tablet, Rfl: 3  nystatin (MYCOSTATIN/NYSTOP) powder, Apply 1 application topically 3 (three) times daily., Disp: 15 g, Rfl: 0   OMEGA-3 FATTY ACIDS PO, , Disp: , Rfl:    omeprazole (PRILOSEC) 40 MG capsule, TAKE 1 CAPSULE (40 MG TOTAL) BY MOUTH 2 (TWO) TIMES DAILY BEFORE A MEAL., Disp: 180 capsule, Rfl: 1   propranolol (INDERAL) 10 MG tablet, TAKE 1 TABLET (10 MG TOTAL) BY MOUTH 4 (FOUR) TIMES DAILY AS NEEDED (FAST HEART RATE/PALPITATIONS)., Disp: 180 tablet, Rfl: 0   traMADol (ULTRAM) 50 MG tablet, Take 50 mg by mouth every 6 (six) hours., Disp: , Rfl:    triamcinolone (KENALOG) 0.1 %, Apply 1 application topically 2 (two) times daily., Disp: 30 g, Rfl: 0   Vitamin D, Ergocalciferol, (DRISDOL) 1.25 MG (50000  UT) CAPS capsule, TAKE ONE CAPSULE BY MOUTH EVERY 7 DAYS, Disp: 12 capsule, Rfl: 1  Allergies  Allergen Reactions   Doxycycline Itching and Rash   Tamiflu [Oseltamivir Phosphate] Rash   Review of Systems Objective:  There were no vitals filed for this visit.  General: Well developed, nourished, in no acute distress, alert and oriented x3   Dermatological: Skin is warm, dry and supple bilateral. Nails x 10 are well maintained; remaining integument appears unremarkable at this time. There are no open sores, no preulcerative lesions, no rash or signs of infection present.  Vascular: Dorsalis Pedis artery and Posterior Tibial artery pedal pulses are 2/4 bilateral with immedate capillary fill time. Pedal hair growth present. No varicosities and no lower extremity edema present bilateral.   Neruologic: Grossly intact via light touch bilateral. Vibratory intact via tuning fork bilateral. Protective threshold with Semmes Wienstein monofilament intact to all pedal sites bilateral. Patellar and Achilles deep tendon reflexes 2+ bilateral. No Babinski or clonus noted bilateral.   Musculoskeletal: No gross boney pedal deformities bilateral. No pain, crepitus, or limitation noted with foot and ankle range of motion bilateral. Muscular strength 5/5 in all groups tested bilateral.  Adductovarus rotated hammertoe deformities fifth bilateral.  Gait: Unassisted, Nonantalgic.    Radiographs:  Radiographs taken today demonstrate a osseously mature individual rectus foot type.  Adductovarus rotated hammertoe deformities fifth bilateral.  Assessment & Plan:   Assessment: Reactive hyperkeratosis posterior heels with fissures.  Plantar heels.  She also has adductovarus rotated hammertoe deformities fifth bilateral.  Plan: Discussed the use of O'Keefe's cream and then discuss surgical intervention regarding for toes bilaterally.  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     Virginia Woods T. [place], [place]

## 2020-09-26 ENCOUNTER — Encounter: Payer: BC Managed Care – PPO | Admitting: Obstetrics and Gynecology

## 2020-09-29 ENCOUNTER — Encounter: Payer: BC Managed Care – PPO | Admitting: Obstetrics and Gynecology

## 2020-10-13 ENCOUNTER — Encounter: Payer: Self-pay | Admitting: Physician Assistant

## 2020-10-15 ENCOUNTER — Other Ambulatory Visit: Payer: Self-pay

## 2020-10-15 ENCOUNTER — Ambulatory Visit: Payer: Federal, State, Local not specified - PPO | Admitting: Physician Assistant

## 2020-10-15 ENCOUNTER — Encounter: Payer: Self-pay | Admitting: Physician Assistant

## 2020-10-15 ENCOUNTER — Other Ambulatory Visit (HOSPITAL_COMMUNITY)
Admission: RE | Admit: 2020-10-15 | Discharge: 2020-10-15 | Disposition: A | Discharge status: Home / Self Care | Payer: Federal, State, Local not specified - PPO | Source: Ambulatory Visit | Attending: Physician Assistant | Admitting: Physician Assistant

## 2020-10-15 VITALS — BP 121/83 | HR 90 | Temp 98.7°F | Wt 215.0 lb

## 2020-10-15 DIAGNOSIS — Z113 Encounter for screening for infections with a predominantly sexual mode of transmission: Secondary | ICD-10-CM | POA: Diagnosis not present

## 2020-10-15 DIAGNOSIS — N750 Cyst of Bartholin's gland: Secondary | ICD-10-CM

## 2020-10-15 MED ORDER — DOXYCYCLINE HYCLATE 100 MG PO TABS: 100.0000 mg | ORAL_TABLET | Freq: Two times a day (BID) | ORAL | 0 refills | Status: DC

## 2020-10-15 NOTE — Patient Instructions (Signed)
Bartholin's Cyst  A Bartholin's cyst is a fluid-filled sac that forms as a result of a blockage along the tube (duct) of the Bartholin's gland. Bartholin's glands are small glands in the folds of skin around the vaginal opening (labia). These glands produce fluid to moisten or lubricate the outside of the vagina during sex. A cyst that is not large or infected may not cause any problems or require treatment. If the cyst gets infected with bacteria, it is called a Bartholin's abscess. An abscess may cause symptoms such as pain and swelling and is more likely to require treatment. What are the causes? This condition may be caused by a blocked Bartholin's gland duct. These ducts can become blocked due to natural buildup of fluid and oils. Bacteria inside of the cyst can cause infection. In many cases, the cause is not known. What are the signs or symptoms? Symptoms may include:  A bulge or lump on the labia, near the lower opening of the vagina.  Discomfort or pain. This may get worse during sex or when walking.  Redness, swelling, or fluid draining from the area. These may be signs of an abscess. How severe your symptoms are depends on the size of your cyst and whether it is infected. Infection causes symptoms to get more severe. How is this diagnosed? This condition may be diagnosed based on:  Your symptoms and medical history.  A physical exam to check for swelling in your vaginal area. You may lie on your back on an exam table and have your feet placed into footrests for the exam.  Blood tests to check for infections.  Removal of a fluid sample from the cyst or abscess (biopsy) for testing. You may work with a health care provider who specializes in women's health (gynecologist) for diagnosis and treatment. How is this treated? If your cyst is small, not infected, and not causing symptoms, you may not need treatment. These cysts often go away on their own, with home care such as hot  baths or warm compresses. If you have a large cyst or an abscess, treatment may include:  Antibiotic medicine.  A procedure to drain the fluid inside the cyst or abscess. These procedures involve making an incision in the cyst or abscess so that the fluid drains out, and then one of the following may be done: ? A small, thin tube (catheter) may be placed inside the cyst or abscess so that it does not close and fill up with fluid again (fistulization). The catheter will be removed at a follow-up visit. ? The edges of the incision may be stitched to your skin so that the cyst or abscess stays open (marsupialization). This allows it to continue to drain and not fill up with fluid again. If you have cysts or abscesses that keep returning (recurring) and have required incision and drainage multiple times, your health care provider may talk with you about surgery to remove the Bartholin's gland. Follow these instructions at home: Medicines  Take over-the-counter and prescription medicines only as told by your health care provider.  If you were prescribed an antibiotic medicine, take it as told by your health care provider. Do not stop taking the antibiotic even if your condition improves. Managing pain and swelling  Try sitz baths to help with pain and swelling. A sitz bath is a warm water bath in which the water only comes up to your hips and should cover your buttocks. You may take sitz baths several times a   day.  Apply heat to the affected area as often as needed. Use the heat source that your health care provider recommends, such as a moist heat pack or a heating pad. ? Place a towel between your skin and the heat source. ? Leave the heat on for 20-30 minutes. ? Remove the heat if your skin turns bright red. This is especially important if you are unable to feel pain, heat, or cold. You may have a greater risk of getting burned. Do not fall asleep with the heating pad in place. General  instructions  If your cyst or abscess was drained, follow instructions from your health care provider about how to take care of your wound. Use feminine pads as needed to absorb any drainage.  Do not push on or squeeze your cyst.  Do not have sex until the cyst has gone away or your wound from drainage has healed.  Take these steps to help prevent a Bartholin's cyst from returning and to prevent other Bartholin's cysts from developing: ? Take a bath or shower once a day. Clean your vaginal area with mild soap and water when you bathe. ? Practice safe sex to prevent STIs. Talk with your health care provider about how to prevent STIs and which forms of birth control (contraception) may be best for you.  Keep all follow-up visits. This is important. Contact a health care provider if:  You have a fever.  You develop increasing redness, swelling, or pain around your cyst.  You have fluid, blood, pus, or a bad smell coming from your cyst.  You have a cyst that gets larger or comes back. Summary  A Bartholin's cyst is a fluid-filled sac that forms as a result of a blockage along the duct of the Bartholin's gland.  If your cyst is small, not infected, and not causing symptoms, you may not need any treatment.  If you have a large cyst or an abscess, your health care provider may perform a procedure to drain the fluid.  If you have cysts or abscesses that keep returning (recurring) and have required incision and drainage multiple times, your health care provider may talk with you about surgery to remove the Bartholin's gland. This information is not intended to replace advice given to you by your health care provider. Make sure you discuss any questions you have with your health care provider. Document Revised: 02/04/2020 Document Reviewed: 02/04/2020 Elsevier Patient Education  2021 [employer] Inc.  

## 2020-10-15 NOTE — Progress Notes (Signed)
Established patient visit   Patient: Virginia Woods   DOB: [birth year]   50 y.o. Female  MRN: [id number] Visit Date: 10/15/2020   Today's healthcare provider: Mar Daring, PA-C   Chief Complaint  Patient presents with  . Vaginal Pain   Subjective    Vaginal Itching The patient's primary symptoms include genital lesions. The patient's pertinent negatives include no genital itching, genital odor, genital rash, pelvic pain, vaginal bleeding or vaginal discharge. This is a new problem. The current episode started in the past 7 days. The problem has been gradually improving. The pain is mild. The problem affects the left side. She is not pregnant. Associated symptoms include painful intercourse. Pertinent negatives include no abdominal pain, back pain, chills, constipation, diarrhea, dysuria, flank pain, frequency, headaches, hematuria, nausea or urgency. The symptoms are aggravated by urinating (sitting). She has tried acetaminophen, heating pads and warm baths for the symptoms. The treatment provided mild relief. She is sexually active. No, her partner does not have an STD.    Patient with a lesion on the left side of the vagina. Reports it was swollen and tender, but started to drain yesterday.  Patient Active Problem List   Diagnosis Date Noted  . Moderate episode of recurrent major depressive disorder ([place]) 03/30/2020  . Dysphagia   . SVT (supraventricular tachycardia) ([place]) 04/10/2019  . Mobitz type 2 second degree heart block 03/19/2019  . Cervical radiculopathy 06/28/2018  . 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 Allergic rhinitis 05/20/2015  . Arnold-Chiari malformation ([place]) 05/20/2015  . Chronic headache 05/20/2015  . Chronic pain 05/20/2015  . Clinical depression 05/20/2015  . Dermatitis, eczematoid 05/20/2015  . Acute Lyme disease 05/20/2015  . Muscle ache 05/20/2015  . Apnea, sleep 05/20/2015  . B12 deficiency 05/20/2015  . Avitaminosis D  05/20/2015  . Hypo-osmolality and hyponatremia 10/13/2009  . Anemia, iron deficiency 05/16/2009  . Cannot sleep 04/18/2008  . Anxiety 11/05/2004   Past Medical History:  Diagnosis Date  . Anemia   . Anxiety   . Complication of anesthesia    pt reports some complication after 2nd brain surgery, but unsure of what it was.  . Dental bridge present    Perrmanent dental retainer - bottom  . Depression   . Difficult intubation    with 2nd chiari surgery (per pt)  . Family history of adverse reaction to anesthesia    reports mother had a "negative reaction" to "anesthesia or lidocaine"  . Headache   . History of Chiari malformation 2011   2 surgeries 2011 [facility]  . Vitamin B12 deficiency   . Vitamin D deficiency   . Wears contact lenses    Social History   Tobacco Use  . Smoking status: Never Smoker  . Smokeless tobacco: Never Used  Vaping Use  . Vaping Use: Never used  Substance Use Topics  . Alcohol use: Yes    Comment: 1-2 glasses wine per month  . Drug use: No   Allergies  Allergen Reactions  . Doxycycline Itching and Rash  . Tamiflu [Oseltamivir Phosphate] Rash     Medications: Outpatient Medications Prior to Visit  Medication Sig  . albuterol (VENTOLIN HFA) 108 (90 Base) MCG/ACT inhaler Inhale 2 puffs into the lungs every 6 (six) hours as needed for wheezing or shortness of breath.  . ALPRAZolam (XANAX) 0.5 MG tablet Take 1 tablet (0.5 mg total) by mouth 2 (two) times daily as needed.  . Ascorbic Acid (VITAMIN C PO)  Take by mouth.  . citalopram (CELEXA) 40 MG tablet Take 1 tablet (40 mg total) by mouth daily.  . Cyanocobalamin (VITAMIN B 12 PO) Take by mouth.  . cyclobenzaprine (FLEXERIL) 5 MG tablet Take 1 tablet (5 mg total) by mouth 3 (three) times daily as needed for muscle spasms.  . diphenhydrAMINE (BENADRYL) 25 MG tablet Take 25 mg by mouth at bedtime as needed for sleep.  Marland Kitchen gabapentin (NEURONTIN) 300 MG capsule Take 1 capsule (300 mg total) by mouth 2 (two)  times daily.  Marland Kitchen lidocaine (LIDODERM) 5 % Place 1 patch onto the skin daily. Remove & Discard patch within 12 hours or as directed by MD  . loratadine (CLARITIN) 10 MG tablet Take 10 mg by mouth daily as needed.   . meloxicam (MOBIC) 7.5 MG tablet TAKE 1 TABLET BY MOUTH EVERY DAY  . metoprolol succinate (TOPROL-XL) 25 MG 24 hr tablet Take 1 tablet (25 mg total) by mouth daily.  Marland Kitchen nystatin (MYCOSTATIN/NYSTOP) powder Apply 1 application topically 3 (three) times daily.  . OMEGA-3 FATTY ACIDS PO   . propranolol (INDERAL) 10 MG tablet TAKE 1 TABLET (10 MG TOTAL) BY MOUTH 4 (FOUR) TIMES DAILY AS NEEDED (FAST HEART RATE/PALPITATIONS).  Marland Kitchen triamcinolone (KENALOG) 0.1 % Apply 1 application topically 2 (two) times daily.  . Vitamin D, Ergocalciferol, (DRISDOL) 1.25 MG (50000 UT) CAPS capsule TAKE ONE CAPSULE BY MOUTH EVERY 7 DAYS  . omeprazole (PRILOSEC) 40 MG capsule TAKE 1 CAPSULE (40 MG TOTAL) BY MOUTH 2 (TWO) TIMES DAILY BEFORE A MEAL.  Marland Kitchen traMADol (ULTRAM) 50 MG tablet Take 50 mg by mouth every 6 (six) hours.   No facility-administered medications prior to visit.    Review of Systems  Constitutional: Negative.  Negative for chills.  Respiratory: Negative.   Gastrointestinal: Negative for abdominal pain, constipation, diarrhea and nausea.  Genitourinary: Positive for genital sores and vaginal pain. Negative for decreased urine volume, difficulty urinating, dyspareunia, dysuria, enuresis, flank pain, frequency, hematuria, menstrual problem, pelvic pain, urgency, vaginal bleeding and vaginal discharge.  Musculoskeletal: Negative for back pain.  Neurological: Negative for dizziness, light-headedness and headaches.       Objective    BP 121/83 (BP Location: Left Arm, Patient Position: Sitting, Cuff Size: Large)   Pulse 90   Temp 98.7 F (37.1 C) (Oral)   Wt 215 lb (97.5 kg)   BMI 35.78 kg/m     Physical Exam Vitals reviewed.  Constitutional:      General: She is not in acute distress.     Appearance: Normal appearance. She is well-developed and well-nourished. She is obese. She is not ill-appearing.  HENT:     Head: Normocephalic and atraumatic.  Eyes:     Extraocular Movements: EOM normal.  Pulmonary:     Effort: Pulmonary effort is normal. No respiratory distress.  Abdominal:     Hernia: There is no hernia in the left inguinal area or right inguinal area.  Genitourinary:    General: Normal vulva.     Pubic Area: No rash.      Labia:        Right: No rash, tenderness, lesion or injury.        Left: Lesion present. No rash, tenderness or injury.     Musculoskeletal:     Cervical back: Normal range of motion and neck supple.  Lymphadenopathy:     Lower Body: No right inguinal adenopathy. No left inguinal adenopathy.  Neurological:     Mental Status: She is alert.  Psychiatric:        Mood and Affect: Mood and affect normal.        Behavior: Behavior normal.        Thought Content: Thought content normal.        Judgment: Judgment normal.      No results found for any visits on 10/15/20.  Assessment & Plan     1. Bartholin cyst Infected bartholin cyst. Doxycycline prescribed as below. Culture obtained. Continue epsom salt soaks and warm compresses. Call if worsening.  - doxycycline (VIBRA-TABS) 100 MG tablet; Take 1 tablet (100 mg total) by mouth 2 (two) times daily.  Dispense: 20 tablet; Refill: 0 - Aerobic culture  2. Screen for STD (sexually transmitted disease) Will check labs as below and f/u pending results. - Cervicovaginal ancillary only   No follow-ups on file.      Reynolds Bowl, PA-C, have reviewed all documentation for this visit. The documentation on 11/07/20 for the exam, diagnosis, procedures, and orders are all accurate and complete.   Rubye Beach  [facility] [phone] (phone) [phone] (fax)  [place]

## 2020-10-16 DIAGNOSIS — F411 Generalized anxiety disorder: Secondary | ICD-10-CM | POA: Diagnosis not present

## 2020-10-17 ENCOUNTER — Telehealth: Payer: Self-pay

## 2020-10-17 LAB — CERVICOVAGINAL ANCILLARY ONLY
Bacterial Vaginitis (gardnerella): NEGATIVE
Candida Glabrata: NEGATIVE
Candida Vaginitis: NEGATIVE
Chlamydia: NEGATIVE
Comment: NEGATIVE
Comment: NEGATIVE
Comment: NEGATIVE
Comment: NEGATIVE
Comment: NEGATIVE
Comment: NORMAL
Neisseria Gonorrhea: NEGATIVE
Trichomonas: NEGATIVE

## 2020-10-17 LAB — AEROBIC CULTURE

## 2020-10-17 NOTE — Telephone Encounter (Signed)
Pt advised.   Thanks,   -Virginia Woods  

## 2020-10-17 NOTE — Telephone Encounter (Signed)
-----   Message from Mar Daring, [place] sent at 10/17/2020  9:00 AM EST ----- Wound culture is positive for a strep bacteria. Not MRSA. Continue antibiotic as prescribed.

## 2020-10-22 DIAGNOSIS — G43909 Migraine, unspecified, not intractable, without status migrainosus: Secondary | ICD-10-CM | POA: Insufficient documentation

## 2020-10-22 DIAGNOSIS — L723 Sebaceous cyst: Secondary | ICD-10-CM | POA: Diagnosis not present

## 2020-10-22 DIAGNOSIS — D649 Anemia, unspecified: Secondary | ICD-10-CM | POA: Insufficient documentation

## 2020-10-23 DIAGNOSIS — F411 Generalized anxiety disorder: Secondary | ICD-10-CM | POA: Diagnosis not present

## 2020-10-24 ENCOUNTER — Ambulatory Visit: Payer: BC Managed Care – PPO | Admitting: Allergy

## 2020-11-06 DIAGNOSIS — F411 Generalized anxiety disorder: Secondary | ICD-10-CM | POA: Diagnosis not present

## 2020-11-07 ENCOUNTER — Encounter: Payer: Self-pay | Admitting: Physician Assistant

## 2020-11-11 ENCOUNTER — Ambulatory Visit: Payer: Federal, State, Local not specified - PPO | Attending: Internal Medicine

## 2020-11-11 DIAGNOSIS — F411 Generalized anxiety disorder: Secondary | ICD-10-CM | POA: Diagnosis not present

## 2020-11-11 DIAGNOSIS — Z23 Encounter for immunization: Secondary | ICD-10-CM

## 2020-11-11 NOTE — Progress Notes (Signed)
   Covid-19 Vaccination Clinic  Name:  Virginia Woods    MRN: [id number] DOB: 04/05/1971  11/11/2020  [name] was observed post Covid-19 immunization for 15 minutes without incident. She was provided with Vaccine Information Sheet and instruction to access the V-Safe system.   [name] was instructed to call 911 with any severe reactions post vaccine: Marland Kitchen Difficulty breathing  . Swelling of face and throat  . A fast heartbeat  . A bad rash all over body  . Dizziness and weakness   Immunizations Administered    Name Date Dose VIS Date Route   PFIZER Comrnaty(Gray TOP) Covid-19 Vaccine 11/11/2020  1:07 PM 0.3 mL 08/28/2020 Intramuscular   Manufacturer: [place]   Lot: [id number]   NDC: [phone]

## 2020-11-23 ENCOUNTER — Encounter: Payer: Self-pay | Admitting: Physician Assistant

## 2020-11-25 ENCOUNTER — Other Ambulatory Visit: Payer: Self-pay | Admitting: Physician Assistant

## 2020-11-25 ENCOUNTER — Ambulatory Visit: Payer: Federal, State, Local not specified - PPO | Admitting: Adult Health

## 2020-11-25 ENCOUNTER — Other Ambulatory Visit: Payer: Self-pay

## 2020-11-25 ENCOUNTER — Encounter: Payer: Self-pay | Admitting: Adult Health

## 2020-11-25 VITALS — BP 120/80 | HR 87 | Temp 98.2°F | Resp 16 | Wt 214.0 lb

## 2020-11-25 DIAGNOSIS — F419 Anxiety disorder, unspecified: Secondary | ICD-10-CM

## 2020-11-25 DIAGNOSIS — R4589 Other symptoms and signs involving emotional state: Secondary | ICD-10-CM | POA: Insufficient documentation

## 2020-11-25 DIAGNOSIS — Z9189 Other specified personal risk factors, not elsewhere classified: Secondary | ICD-10-CM | POA: Insufficient documentation

## 2020-11-25 DIAGNOSIS — E559 Vitamin D deficiency, unspecified: Secondary | ICD-10-CM | POA: Diagnosis not present

## 2020-11-25 DIAGNOSIS — G4700 Insomnia, unspecified: Secondary | ICD-10-CM

## 2020-11-25 MED ORDER — PROPRANOLOL HCL 10 MG PO TABS: ORAL_TABLET | ORAL | 0 refills | Status: DC

## 2020-11-25 MED ORDER — ZOLPIDEM TARTRATE 5 MG PO TABS: 2.5000 mg | ORAL_TABLET | Freq: Every evening | ORAL | 0 refills | Status: DC | PRN

## 2020-11-25 NOTE — Telephone Encounter (Signed)
Requested medication (s) are due for refill today: no  Requested medication (s) are on the active medication list:  yes  Last refill: 03/26/2020  Future visit scheduled: yes  Notes to clinic:  this refill cannot be delegated    Requested Prescriptions  Pending Prescriptions Disp Refills   ALPRAZolam (XANAX) 0.5 MG tablet [Pharmacy Med Name: ALPRAZOLAM 0.5 MG TABLET] 180 tablet     Sig: Take 1 tablet (0.5 mg total) by mouth 2 (two) times daily as needed.      Not Delegated - Psychiatry:  Anxiolytics/Hypnotics Failed - 11/25/2020  8:17 AM      Failed - This refill cannot be delegated      Failed - Urine Drug Screen completed in last 360 days      Passed - Valid encounter within last 6 months    Recent Outpatient Visits           1 month ago Bartholin cyst   [place], Clearnce Sorrel, [place]   3 months ago [facility], [place], [place]   4 months ago Pharyngitis, unspecified etiology   [place] Flinchum, Kelby Aline, FNP   7 months ago Acute left-sided low back pain without sciatica   [place], PA-C   8 months ago Routine general medical examination at a health care facility   [facility], Clearnce Sorrel, [place]       Future Appointments             Today Flinchum, Kelby Aline, [place], [place]

## 2020-11-25 NOTE — Progress Notes (Signed)
Established patient visit   Patient: Virginia Woods   DOB: Oct 22, 1970   50 y.o. Female  MRN: [id number] Visit Date: 11/25/2020  Today's healthcare provider: Marcille Buffy, FNP   Chief Complaint  Patient presents with  . Anxiety   Subjective    Anxiety Presents for follow-up (patient reports that she has been out of work for the past 2 days due to anxiety) visit. Symptoms include decreased concentration, depressed mood (patient describes as being "mentally drained"), excessive worry, insomnia, malaise, muscle tension, nervous/anxious behavior and restlessness. Patient reports no chest pain, compulsions, confusion, dizziness, dry mouth, feeling of choking, hyperventilation, impotence, irritability, nausea, obsessions, palpitations, panic, shortness of breath or suicidal ideas. The quality of sleep is poor.   Compliance with medications is 76-100%.    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 She has had stress at home. Son told her recently he has - he has 12 years old and has lung function of 51 year old female. She is worried. Daughter is 77 and having some issues with mental health ans she is strident ay [place].   Patient denies suicidals or homicidal ideations.  She is on Celexa 40 mg tablet.  She is not sleeping well. She takes xanax as needed only 0.5 mg. Does not make her sleepy. She is  Not sleeping well at all.  She has therapist.       Medications: Outpatient Medications Prior to Visit  Medication Sig  . albuterol (VENTOLIN HFA) 108 (90 Base) MCG/ACT inhaler Inhale 2 puffs into the lungs every 6 (six) hours as needed for wheezing or shortness of breath.  . Ascorbic Acid (VITAMIN C PO) Take by mouth.  . citalopram (CELEXA) 40 MG tablet Take 1 tablet (40 mg total) by mouth daily.  . Cyanocobalamin (VITAMIN B 12 PO) Take by mouth.  . cyclobenzaprine (FLEXERIL) 5 MG tablet Take 1 tablet (5 mg total) by mouth 3 (three) times daily as needed  for muscle spasms.  . diphenhydrAMINE (BENADRYL) 25 MG tablet Take 25 mg by mouth at bedtime as needed for sleep.  Marland Kitchen doxycycline (VIBRA-TABS) 100 MG tablet Take 1 tablet (100 mg total) by mouth 2 (two) times daily.  Marland Kitchen gabapentin (NEURONTIN) 300 MG capsule Take 1 capsule (300 mg total) by mouth 2 (two) times daily.  Marland Kitchen lidocaine (LIDODERM) 5 % Place 1 patch onto the skin daily. Remove & Discard patch within 12 hours or as directed by MD  . loratadine (CLARITIN) 10 MG tablet Take 10 mg by mouth daily as needed.   . meloxicam (MOBIC) 7.5 MG tablet TAKE 1 TABLET BY MOUTH EVERY DAY  . metoprolol succinate (TOPROL-XL) 25 MG 24 hr tablet Take 1 tablet (25 mg total) by mouth daily.  Marland Kitchen nystatin (MYCOSTATIN/NYSTOP) powder Apply 1 application topically 3 (three) times daily.  . OMEGA-3 FATTY ACIDS PO   . propranolol (INDERAL) 10 MG tablet TAKE 1 TABLET (10 MG TOTAL) BY MOUTH 4 (FOUR) TIMES DAILY AS NEEDED (FAST HEART RATE/PALPITATIONS). Please make overdue appt with Dr. Acie Fredrickson. 3rd and Final Attempt  . triamcinolone (KENALOG) 0.1 % Apply 1 application topically 2 (two) times daily.  . Vitamin D, Ergocalciferol, (DRISDOL) 1.25 MG (50000 UT) CAPS capsule TAKE ONE CAPSULE BY MOUTH EVERY 7 DAYS  . [DISCONTINUED] ALPRAZolam (XANAX) 0.5 MG tablet Take 1 tablet (0.5 mg total) by mouth 2 (two) times daily as needed.  Marland Kitchen omeprazole (PRILOSEC) 40 MG capsule TAKE 1 CAPSULE (40  MG TOTAL) BY MOUTH 2 (TWO) TIMES DAILY BEFORE A MEAL.   No facility-administered medications prior to visit.    Review of Systems  Constitutional: Negative.  Negative for irritability.  HENT: Negative.   Respiratory: Negative.  Negative for shortness of breath.   Cardiovascular: Negative for chest pain and palpitations.  Gastrointestinal: Negative for nausea.  Genitourinary: Negative.  Negative for impotence.  Neurological: Negative for dizziness.  Psychiatric/Behavioral: Positive for decreased concentration. Negative for agitation,  behavioral problems, confusion, dysphoric mood, hallucinations, self-injury, sleep disturbance and suicidal ideas. The patient is nervous/anxious and has insomnia. The patient is not hyperactive.        Objective    BP 120/80   Pulse 87   Temp 98.2 F (36.8 C) (Oral)   Resp 16   Wt 214 lb (97.1 kg)   SpO2 100%   BMI 35.61 kg/m     Physical Exam Vitals reviewed.  Constitutional:      General: She is not in acute distress.    Appearance: She is well-developed. She is not diaphoretic.     Interventions: She is not intubated. HENT:     Head: Normocephalic and atraumatic.     Right Ear: External ear normal.     Left Ear: External ear normal.     Nose: Nose normal.     Mouth/Throat:     Pharynx: No oropharyngeal exudate.  Eyes:     General: Lids are normal. No scleral icterus.       Right eye: No discharge.        Left eye: No discharge.     Conjunctiva/sclera: Conjunctivae normal.     Right eye: Right conjunctiva is not injected. No exudate or hemorrhage.    Left eye: Left conjunctiva is not injected. No exudate or hemorrhage.    Pupils: Pupils are equal, round, and reactive to light.  Neck:     Thyroid: No thyroid mass or thyromegaly.     Vascular: Normal carotid pulses. No carotid bruit, hepatojugular reflux or JVD.     Trachea: Trachea and phonation normal. No tracheal tenderness or tracheal deviation.     Meningeal: Brudzinski's sign and Kernig's sign absent.  Cardiovascular:     Rate and Rhythm: Normal rate and regular rhythm.     Pulses: Normal pulses.          Radial pulses are 2+ on the right side and 2+ on the left side.       Dorsalis pedis pulses are 2+ on the right side and 2+ on the left side.       Posterior tibial pulses are 2+ on the right side and 2+ on the left side.     Heart sounds: Normal heart sounds, S1 normal and S2 normal. Heart sounds not distant. No murmur heard. No friction rub. No gallop.   Pulmonary:     Effort: Pulmonary effort is normal.  No tachypnea, bradypnea, accessory muscle usage or respiratory distress. She is not intubated.     Breath sounds: Normal breath sounds. No stridor. No wheezing or rales.  Chest:     Chest wall: No tenderness.  Breasts:     Right: No supraclavicular adenopathy.     Left: No supraclavicular adenopathy.    Abdominal:     General: Bowel sounds are normal. There is no distension or abdominal bruit.     Palpations: Abdomen is soft. There is no shifting dullness, fluid wave, hepatomegaly, splenomegaly, mass or pulsatile mass.  Tenderness: There is no abdominal tenderness. There is no guarding or rebound.     Hernia: No hernia is present.  Musculoskeletal:        General: No tenderness or deformity. Normal range of motion.     Cervical back: Full passive range of motion without pain, normal range of motion and neck supple. No edema, erythema or rigidity. No spinous process tenderness or muscular tenderness. Normal range of motion.  Lymphadenopathy:     Head:     Right side of head: No submental, submandibular, tonsillar, preauricular, posterior auricular or occipital adenopathy.     Left side of head: No submental, submandibular, tonsillar, preauricular, posterior auricular or occipital adenopathy.     Cervical: No cervical adenopathy.     Right cervical: No superficial, deep or posterior cervical adenopathy.    Left cervical: No superficial, deep or posterior cervical adenopathy.     Upper Body:     Right upper body: No supraclavicular or pectoral adenopathy.     Left upper body: No supraclavicular or pectoral adenopathy.  Skin:    General: Skin is warm and dry.     Coloration: Skin is not pale.     Findings: No abrasion, bruising, burn, ecchymosis, erythema, lesion, petechiae or rash.     Nails: There is no clubbing.  Neurological:     Mental Status: She is alert and oriented to person, place, and time.     GCS: GCS eye subscore is 4. GCS verbal subscore is 5. GCS motor subscore is 6.      Cranial Nerves: No cranial nerve deficit.     Sensory: No sensory deficit.     Motor: No tremor, atrophy, abnormal muscle tone or seizure activity.     Coordination: Coordination normal.     Gait: Gait normal.     Deep Tendon Reflexes: Reflexes are normal and symmetric. Reflexes normal. Babinski sign absent on the right side. Babinski sign absent on the left side.     Reflex Scores:      Tricep reflexes are 2+ on the right side and 2+ on the left side.      Bicep reflexes are 2+ on the right side and 2+ on the left side.      Brachioradialis reflexes are 2+ on the right side and 2+ on the left side.      Patellar reflexes are 2+ on the right side and 2+ on the left side.      Achilles reflexes are 2+ on the right side and 2+ on the left side. Psychiatric:        Speech: Speech normal.        Behavior: Behavior normal.        Thought Content: Thought content normal.        Judgment: Judgment normal.     No results found for any visits on 11/25/20.  Assessment & Plan     Anxiety - Plan: CBC With Differential, Comprehensive Metabolic Panel (CMET), TSH  Vitamin D deficiency - Plan: VITAMIN D 25 Hydroxy (Vit-D Deficiency, Fractures)  At increased risk for stress  Tearfulness  Insomnia, unspecified type  Needs refill on alprazolam last refilled in July 180 tablets 90 day supply by [facility] and refill request is pending. Will refill.    she is not sleeping recommended change in antidepressant or adding on Trazodone, she has had Trazodone in past and did not like the way it made her feel.  She requests trying Ambien low dose  advised discussed not taking within 6 hour of any xanax not to mix with Ambien. Discussed side effects of Ambien and stopping if any occur.    Meds ordered this encounter  Medications  . zolpidem (AMBIEN) 5 MG tablet    Sig: Take 0.5-1 tablets (2.5-5 mg total) by mouth at bedtime as needed for sleep (do not take within 6 hours of xanax.).    Dispense:  30  tablet    Refill:  0   Discussed refferal to [facility] but she would like to wait and will reach out if she is not feeling better.   Discussed her following up with her cardiologist as well.  Red Flags discussed. The patient was given clear instructions to go to ER or return to medical center if any red flags develop, symptoms do not improve, worsen or new problems develop. They verbalized understanding.  Return in about 3 months (around 02/25/2021), or if symptoms worsen or fail to improve, for at any time for any worsening symptoms, Go to Emergency room/ urgent care if worse.     The entirety of the information documented in the History of Present Illness, Review of Systems and Physical Exam were personally obtained by me. Portions of this information were initially documented by the CMA and reviewed by me for thoroughness and accuracy.      Marcille Buffy, [place] [phone] (phone) [phone] (fax)  [place]

## 2020-11-25 NOTE — Patient Instructions (Addendum)
Zolpidem Tablets What is this medicine? ZOLPIDEM (zole PI dem) is used to treat insomnia. This medicine helps you to fall asleep and sleep through the night. This medicine may be used for other purposes; ask your health care provider or pharmacist if you have questions. COMMON BRAND NAME(S): Ambien What should I tell my health care provider before I take this medicine? They need to know if you have any of these conditions:  depression  history of drug abuse or addiction  if you often drink alcohol  liver disease  lung or breathing disease  myasthenia gravis  sleep apnea  sleep-walking, driving, eating or other activity while not fully awake after taking a sleep medicine  suicidal thoughts, plans, or attempt; a previous suicide attempt by you or a family member  an unusual or allergic reaction to zolpidem, other medicines, foods, dyes, or preservatives  pregnant or trying to get pregnant  breast-feeding How should I use this medicine? Take this medicine by mouth with a glass of water. Follow the directions on the prescription label. It is better to take this medicine on an empty stomach and only when you are ready for bed. Do not take your medicine more often than directed. If you have been taking this medicine for several weeks and suddenly stop taking it, you may get unpleasant withdrawal symptoms. Your doctor or health care professional may want to gradually reduce the dose. Do not stop taking this medicine on your own. Always follow your doctor or health care professional's advice. A special MedGuide will be given to you by the pharmacist with each prescription and refill. Be sure to read this information carefully each time. Talk to your pediatrician regarding the use of this medicine in children. Special care may be needed. Overdosage: If you think you have taken too much of this medicine contact a poison control center or emergency room at once. NOTE: This medicine is  only for you. Do not share this medicine with others. What if I miss a dose? This does not apply. This medication should only be taken immediately before going to sleep. Do not take double or extra doses. What may interact with this medicine?  alcohol  antihistamines for allergy, cough and cold  certain medicines for anxiety or sleep  certain medicines for depression, like amitriptyline, fluoxetine, sertraline  certain medicines for fungal infections like ketoconazole and itraconazole  certain medicines for seizures like phenobarbital, primidone  ciprofloxacin  dietary supplements for sleep, like valerian or kava kava  general anesthetics like halothane, isoflurane, methoxyflurane, propofol  local anesthetics like lidocaine, pramoxine, tetracaine  medicines that relax muscles for surgery  narcotic medicines for pain  phenothiazines like chlorpromazine, mesoridazine, prochlorperazine, thioridazine  rifampin This list may not describe all possible interactions. Give your health care provider a list of all the medicines, herbs, non-prescription drugs, or dietary supplements you use. Also tell them if you smoke, drink alcohol, or use illegal drugs. Some items may interact with your medicine. What should I watch for while using this medicine? Visit your doctor or health care professional for regular checks on your progress. Keep a regular sleep schedule by going to bed at about the same time each night. Avoid caffeine-containing drinks in the evening hours. When sleep medicines are used every night for more than a few weeks, they may stop working. Talk to your doctor if you still have trouble sleeping. After taking this medicine, you may get up out of bed and do an activity that  you do not know you are doing. The next morning, you may have no memory of this. Activities include driving a car ("sleep-driving"), making and eating food, talking on the phone, sexual activity, and  sleep-walking. Serious injuries have occurred. Stop the medicine and call your doctor right away if you find out you have done any of these activities. Do not take this medicine if you have used alcohol that evening. Do not take it if you have taken another medicine for sleep. The risk of doing these sleep-related activities is higher. Wait for at least 8 hours after you take a dose before driving or doing other activities that require full mental alertness. Do not take this medicine unless you are able to stay in bed for a full night (7 to 8 hours) before you must be active again. You may have a decrease in mental alertness the day after use, even if you feel that you are fully awake. Tell your doctor if you will need to perform activities requiring full alertness, such as driving, the next day. Do not stand or sit up quickly after taking this medicine, especially if you are an older patient. This reduces the risk of dizzy or fainting spells. If you or your family notice any changes in your behavior, such as new or worsening depression, thoughts of harming yourself, anxiety, other unusual or disturbing thoughts, or memory loss, call your doctor right away. After you stop taking this medicine, you may have trouble falling asleep. This is called rebound insomnia. This problem usually goes away on its own after 1 or 2 nights. What side effects may I notice from receiving this medicine? Side effects that you should report to your doctor or health care professional as soon as possible:  allergic reactions like skin rash, itching or hives, swelling of the face, lips, or tongue  breathing problems  changes in vision  confusion  depressed mood or other changes in moods or emotions  feeling faint or lightheaded, falls  hallucinations  loss of balance or coordination  loss of memory  numbness or tingling of the tongue  restlessness, excitability, or feelings of anxiety or agitation  signs and  symptoms of liver injury like dark yellow or brown urine; general ill feeling or flu-like symptoms; light-colored stools; loss of appetite; nausea; right upper belly pain; unusually weak or tired; yellowing of the eyes or skin  suicidal thoughts  unusual activities while not fully awake like driving, eating, making phone calls, or sexual activity Side effects that usually do not require medical attention (report to your doctor or health care professional if they continue or are bothersome):  dizziness  drowsiness the day after you take this medicine  headache This list may not describe all possible side effects. Call your doctor for medical advice about side effects. You may report side effects to FDA at 1-800-FDA-1088. Where should I keep my medicine? Keep out of the reach of children. This medicine can be abused. Keep your medicine in a safe place to protect it from theft. Do not share this medicine with anyone. Selling or giving away this medicine is dangerous and against the law. This medicine may cause accidental overdose and death if taken by other adults, children, or pets. Mix any unused medicine with a substance like cat litter or coffee grounds. Then throw the medicine away in a sealed container like a sealed bag or a coffee can with a lid. Do not use the medicine after the expiration date. Store  at room temperature between 20 and 25 degrees C (68 and 77 degrees F). NOTE: This sheet is a summary. It may not cover all possible information. If you have questions about this medicine, talk to your doctor, pharmacist, or health care provider.  2021 Elsevier/Gold Standard (2020-08-29 09:54:34) http://NIMH.NIH.Gov">  Generalized Anxiety Disorder, Adult Generalized anxiety disorder (GAD) is a mental health condition. Unlike normal worries, anxiety related to GAD is not triggered by a specific event. These worries do not fade or get better with time. GAD interferes with relationships, work,  and school. GAD symptoms can vary from mild to severe. People with severe GAD can have intense waves of anxiety with physical symptoms that are similar to panic attacks. What are the causes? The exact cause of GAD is not known, but the following are believed to have an impact:  Differences in natural brain chemicals.  Genes passed down from parents to children.  Differences in the way threats are perceived.  Development during childhood.  Personality. What increases the risk? The following factors may make you more likely to develop this condition:  Being female.  Having a family history of anxiety disorders.  Being very shy.  Experiencing very stressful life events, such as the death of a loved one.  Having a very stressful family environment. What are the signs or symptoms? People with GAD often worry excessively about many things in their lives, such as their health and family. Symptoms may also include:  Mental and emotional symptoms: ? Worrying excessively about natural disasters. ? Fear of being late. ? Difficulty concentrating. ? Fears that others are judging your performance.  Physical symptoms: ? Fatigue. ? Headaches, muscle tension, muscle twitches, trembling, or feeling shaky. ? Feeling like your heart is pounding or beating very fast. ? Feeling out of breath or like you cannot take a deep breath. ? Having trouble falling asleep or staying asleep, or experiencing restlessness. ? Sweating. ? Nausea, diarrhea, or irritable bowel syndrome (IBS).  Behavioral symptoms: ? Experiencing erratic moods or irritability. ? Avoidance of new situations. ? Avoidance of people. ? Extreme difficulty making decisions. How is this diagnosed? This condition is diagnosed based on your symptoms and medical history. You will also have a physical exam. Your health care provider may perform tests to rule out other possible causes of your symptoms. To be diagnosed with GAD, a  person must have anxiety that:  Is out of his or her control.  Affects several different aspects of his or her life, such as work and relationships.  Causes distress that makes him or her unable to take part in normal activities.  Includes at least three symptoms of GAD, such as restlessness, fatigue, trouble concentrating, irritability, muscle tension, or sleep problems. Before your health care provider can confirm a diagnosis of GAD, these symptoms must be present more days than they are not, and they must last for 6 months or longer. How is this treated? This condition may be treated with:  Medicine. Antidepressant medicine is usually prescribed for long-term daily control. Anti-anxiety medicines may be added in severe cases, especially when panic attacks occur.  Talk therapy (psychotherapy). Certain types of talk therapy can be helpful in treating GAD by providing support, education, and guidance. Options include: ? Cognitive behavioral therapy (CBT). People learn coping skills and self-calming techniques to ease their physical symptoms. They learn to identify unrealistic thoughts and behaviors and to replace them with more appropriate thoughts and behaviors. ? Acceptance and commitment therapy (ACT). This  treatment teaches people how to be mindful as a way to cope with unwanted thoughts and feelings. ? Biofeedback. This process trains you to manage your body's response (physiological response) through breathing techniques and relaxation methods. You will work with a therapist while machines are used to monitor your physical symptoms.  Stress management techniques. These include yoga, meditation, and exercise. A mental health specialist can help determine which treatment is best for you. Some people see improvement with one type of therapy. However, other people require a combination of therapies.   Follow these instructions at home: Lifestyle  Maintain a consistent routine and  schedule.  Anticipate stressful situations. Create a plan, and allow extra time to work with your plan.  Practice stress management or self-calming techniques that you have learned from your therapist or your health care provider. General instructions  Take over-the-counter and prescription medicines only as told by your health care provider.  Understand that you are likely to have setbacks. Accept this and be kind to yourself as you persist to take better care of yourself.  Recognize and accept your accomplishments, even if you judge them as small.  Keep all follow-up visits as told by your health care provider. This is important. Contact a health care provider if:  Your symptoms do not get better.  Your symptoms get worse.  You have signs of depression, such as: ? A persistently sad or irritable mood. ? Loss of enjoyment in activities that used to bring you joy. ? Change in weight or eating. ? Changes in sleeping habits. ? Avoiding friends or family members. ? Loss of energy for normal tasks. ? Feelings of guilt or worthlessness. Get help right away if:  You have serious thoughts about hurting yourself or others. If you ever feel like you may hurt yourself or others, or have thoughts about taking your own life, get help right away. Go to your nearest emergency department or:  Call your local emergency services (911 in the [place]  Call a suicide crisis helpline, such as the [place] at 256-430-1817. This is open 24 hours a day in the [place]  Text the Crisis Text Line at 908-606-5782 (in the [place].). Summary  Generalized anxiety disorder (GAD) is a mental health condition that involves worry that is not triggered by a specific event.  People with GAD often worry excessively about many things in their lives, such as their health and family.  GAD may cause symptoms such as restlessness, trouble concentrating, sleep problems, frequent sweating, nausea,  diarrhea, headaches, and trembling or muscle twitching.  A mental health specialist can help determine which treatment is best for you. Some people see improvement with one type of therapy. However, other people require a combination of therapies. This information is not intended to replace advice given to you by your health care provider. Make sure you discuss any questions you have with your health care provider. Document Revised: 06/27/2019 Document Reviewed: 06/27/2019 Elsevier Patient Education  2021 [employer].  Insomnia Insomnia is a sleep disorder that makes it difficult to fall asleep or stay asleep. Insomnia can cause fatigue, low energy, difficulty concentrating, mood swings, and poor performance at work or school. There are three different ways to classify insomnia:  Difficulty falling asleep.  Difficulty staying asleep.  Waking up too early in the morning. Any type of insomnia can be long-term (chronic) or short-term (acute). Both are common. Short-term insomnia usually lasts for three months or less. Chronic insomnia occurs at least  three times a week for longer than three months. What are the causes? Insomnia may be caused by another condition, situation, or substance, such as:  Anxiety.  Certain medicines.  Gastroesophageal reflux disease (GERD) or other gastrointestinal conditions.  Asthma or other breathing conditions.  Restless legs syndrome, sleep apnea, or other sleep disorders.  Chronic pain.  Menopause.  Stroke.  Abuse of alcohol, tobacco, or illegal drugs.  Mental health conditions, such as depression.  Caffeine.  Neurological disorders, such as Alzheimer's disease.  An overactive thyroid (hyperthyroidism). Sometimes, the cause of insomnia may not be known. What increases the risk? Risk factors for insomnia include:  Gender. Women are affected more often than men.  Age. Insomnia is more common as you get older.  Stress.  Lack of  exercise.  Irregular work schedule or working night shifts.  Traveling between different time zones.  Certain medical and mental health conditions. What are the signs or symptoms? If you have insomnia, the main symptom is having trouble falling asleep or having trouble staying asleep. This may lead to other symptoms, such as:  Feeling fatigued or having low energy.  Feeling nervous about going to sleep.  Not feeling rested in the morning.  Having trouble concentrating.  Feeling irritable, anxious, or depressed. How is this diagnosed? This condition may be diagnosed based on:  Your symptoms and medical history. Your health care provider may ask about: ? Your sleep habits. ? Any medical conditions you have. ? Your mental health.  A physical exam. How is this treated? Treatment for insomnia depends on the cause. Treatment may focus on treating an underlying condition that is causing insomnia. Treatment may also include:  Medicines to help you sleep.  Counseling or therapy.  Lifestyle adjustments to help you sleep better. Follow these instructions at home: Eating and drinking  Limit or avoid alcohol, caffeinated beverages, and cigarettes, especially close to bedtime. These can disrupt your sleep.  Do not eat a large meal or eat spicy foods right before bedtime. This can lead to digestive discomfort that can make it hard for you to sleep.   Sleep habits  Keep a sleep diary to help you and your health care provider figure out what could be causing your insomnia. Write down: ? When you sleep. ? When you wake up during the night. ? How well you sleep. ? How rested you feel the next day. ? Any side effects of medicines you are taking. ? What you eat and drink.  Make your bedroom a dark, comfortable place where it is easy to fall asleep. ? Put up shades or blackout curtains to block light from outside. ? Use a white noise machine to block noise. ? Keep the temperature  cool.  Limit screen use before bedtime. This includes: ? Watching TV. ? Using your smartphone, tablet, or computer.  Stick to a routine that includes going to bed and waking up at the same times every day and night. This can help you fall asleep faster. Consider making a quiet activity, such as reading, part of your nighttime routine.  Try to avoid taking naps during the day so that you sleep better at night.  Get out of bed if you are still awake after 15 minutes of trying to sleep. Keep the lights down, but try reading or doing a quiet activity. When you feel sleepy, go back to bed.   General instructions  Take over-the-counter and prescription medicines only as told by your health care provider.  Exercise regularly, as told by your health care provider. Avoid exercise starting several hours before bedtime.  Use relaxation techniques to manage stress. Ask your health care provider to suggest some techniques that may work well for you. These may include: ? Breathing exercises. ? Routines to release muscle tension. ? Visualizing peaceful scenes.  Make sure that you drive carefully. Avoid driving if you feel very sleepy.  Keep all follow-up visits as told by your health care provider. This is important. Contact a health care provider if:  You are tired throughout the day.  You have trouble in your daily routine due to sleepiness.  You continue to have sleep problems, or your sleep problems get worse. Get help right away if:  You have serious thoughts about hurting yourself or someone else. If you ever feel like you may hurt yourself or others, or have thoughts about taking your own life, get help right away. You can go to your nearest emergency department or call:  Your local emergency services (911 in the [place]  A suicide crisis helpline, such as the [place] at 380-109-9537. This is open 24 hours a day. Summary  Insomnia is a sleep disorder  that makes it difficult to fall asleep or stay asleep.  Insomnia can be long-term (chronic) or short-term (acute).  Treatment for insomnia depends on the cause. Treatment may focus on treating an underlying condition that is causing insomnia.  Keep a sleep diary to help you and your health care provider figure out what could be causing your insomnia. This information is not intended to replace advice given to you by your health care provider. Make sure you discuss any questions you have with your health care provider. Document Revised: 07/17/2020 Document Reviewed: 07/17/2020 Elsevier Patient Education  2021 [employer].

## 2020-11-26 LAB — CBC WITH DIFFERENTIAL
Basophils Absolute: 0 10*3/uL (ref 0.0–0.2)
Basos: 1 %
EOS (ABSOLUTE): 0.1 10*3/uL (ref 0.0–0.4)
Eos: 1 %
Hematocrit: 38.9 % (ref 34.0–46.6)
Hemoglobin: 12.3 g/dL (ref 11.1–15.9)
Immature Grans (Abs): 0 10*3/uL (ref 0.0–0.1)
Immature Granulocytes: 0 %
Lymphocytes Absolute: 2 10*3/uL (ref 0.7–3.1)
Lymphs: 36 %
MCH: 22.9 pg — ABNORMAL LOW (ref 26.6–33.0)
MCHC: 31.6 g/dL (ref 31.5–35.7)
MCV: 73 fL — ABNORMAL LOW (ref 79–97)
Monocytes Absolute: 0.4 10*3/uL (ref 0.1–0.9)
Monocytes: 8 %
Neutrophils Absolute: 3 10*3/uL (ref 1.4–7.0)
Neutrophils: 54 %
RBC: 5.36 x10E6/uL — ABNORMAL HIGH (ref 3.77–5.28)
RDW: 14.4 % (ref 11.7–15.4)
WBC: 5.6 10*3/uL (ref 3.4–10.8)

## 2020-11-26 LAB — TSH: TSH: 1.25 u[IU]/mL (ref 0.450–4.500)

## 2020-11-26 LAB — COMPREHENSIVE METABOLIC PANEL
ALT: 15 IU/L (ref 0–32)
AST: 18 IU/L (ref 0–40)
Albumin/Globulin Ratio: 1.8 (ref 1.2–2.2)
Albumin: 4.4 g/dL (ref 3.8–4.8)
Alkaline Phosphatase: 75 IU/L (ref 44–121)
BUN/Creatinine Ratio: 11 (ref 9–23)
BUN: 10 mg/dL (ref 6–24)
Bilirubin Total: 0.3 mg/dL (ref 0.0–1.2)
CO2: 24 mmol/L (ref 20–29)
Calcium: 9.5 mg/dL (ref 8.7–10.2)
Chloride: 102 mmol/L (ref 96–106)
Creatinine, Ser: 0.89 mg/dL (ref 0.57–1.00)
Globulin, Total: 2.5 g/dL (ref 1.5–4.5)
Glucose: 85 mg/dL (ref 65–99)
Potassium: 4.1 mmol/L (ref 3.5–5.2)
Sodium: 141 mmol/L (ref 134–144)
Total Protein: 6.9 g/dL (ref 6.0–8.5)
eGFR: 79 mL/min/{1.73_m2} (ref >59)

## 2020-11-26 LAB — VITAMIN D 25 HYDROXY (VIT D DEFICIENCY, FRACTURES): Vit D, 25-Hydroxy: 41.8 ng/mL (ref 30.0–100.0)

## 2020-11-29 ENCOUNTER — Other Ambulatory Visit: Payer: Self-pay | Admitting: Adult Health

## 2020-11-29 ENCOUNTER — Encounter: Payer: Self-pay | Admitting: Adult Health

## 2020-11-29 DIAGNOSIS — E559 Vitamin D deficiency, unspecified: Secondary | ICD-10-CM

## 2020-11-29 MED ORDER — VITAMIN D (ERGOCALCIFEROL) 1.25 MG (50000 UNIT) PO CAPS: ORAL_CAPSULE | ORAL | 1 refills | Status: AC

## 2020-11-29 NOTE — Progress Notes (Unsigned)
Meds ordered this encounter  Medications  . Vitamin D, Ergocalciferol, (DRISDOL) 1.25 MG (50000 UNIT) CAPS capsule    Sig: TAKE ONE CAPSULE BY MOUTH EVERY 7 DAYS    Dispense:  12 capsule    Refill:  1

## 2020-11-29 NOTE — Progress Notes (Signed)
Vitamin  D is low, this can contribute to poor sleep and fatigue, will send in prescription for Vitamin D at 50,000 units by mouth once every 7 days/(once weekly) for 12 weeks. Advise recheck lab Vitamin D in 1-2 weeks after completing vitamin d prescription. Lab iis walk in and is closed during lunch during regular office hours. CBC stable, add on iron, TIBC and ferritin.  CMP within normal limits.  TSH thyroid lab within normal limits.

## 2020-12-02 ENCOUNTER — Telehealth: Payer: Self-pay | Admitting: *Deleted

## 2020-12-02 NOTE — Telephone Encounter (Signed)
Pt viewed lab results of 11/25/20 in MyChart, also noted message. Calling to question elevated RBC count. States she has been taking OTC Iron daily. Questioning if she should stop iron, if this is a concern that would require further testing or conversation with PCP. 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 May respond in [place].  Please advise: [phone]

## 2020-12-05 ENCOUNTER — Ambulatory Visit: Payer: Federal, State, Local not specified - PPO | Admitting: Allergy

## 2020-12-09 DIAGNOSIS — F411 Generalized anxiety disorder: Secondary | ICD-10-CM | POA: Diagnosis not present

## 2020-12-16 DIAGNOSIS — F411 Generalized anxiety disorder: Secondary | ICD-10-CM | POA: Diagnosis not present

## 2020-12-23 DIAGNOSIS — F411 Generalized anxiety disorder: Secondary | ICD-10-CM | POA: Diagnosis not present

## 2020-12-24 ENCOUNTER — Ambulatory Visit: Payer: Federal, State, Local not specified - PPO | Admitting: Adult Health

## 2020-12-25 ENCOUNTER — Ambulatory Visit: Payer: Federal, State, Local not specified - PPO | Admitting: Adult Health

## 2020-12-30 ENCOUNTER — Ambulatory Visit: Payer: Federal, State, Local not specified - PPO | Admitting: Podiatry

## 2021-01-05 ENCOUNTER — Encounter: Payer: Self-pay | Admitting: Adult Health

## 2021-01-05 ENCOUNTER — Ambulatory Visit: Payer: Federal, State, Local not specified - PPO | Admitting: Adult Health

## 2021-01-05 ENCOUNTER — Other Ambulatory Visit: Payer: Self-pay

## 2021-01-05 VITALS — BP 132/77 | HR 93 | Resp 16 | Wt 217.0 lb

## 2021-01-05 DIAGNOSIS — R1310 Dysphagia, unspecified: Secondary | ICD-10-CM

## 2021-01-05 DIAGNOSIS — R718 Other abnormality of red blood cells: Secondary | ICD-10-CM | POA: Diagnosis not present

## 2021-01-05 DIAGNOSIS — E559 Vitamin D deficiency, unspecified: Secondary | ICD-10-CM

## 2021-01-05 MED ORDER — LEVOCETIRIZINE DIHYDROCHLORIDE 5 MG PO TABS: 5.0000 mg | ORAL_TABLET | Freq: Every evening | ORAL | 0 refills | Status: DC

## 2021-01-05 NOTE — Progress Notes (Signed)
Established patient visit   Patient: Virginia Woods   DOB: May 19, 1971   50 y.o. Female  MRN: [id number] Visit Date: 01/05/2021  Today's healthcare provider: Marcille Buffy, FNP   Chief Complaint  Patient presents with  . Neck Pain  . Dysphagia    Patient presents with concerns of dysphagia, she states that this has been an ongoing issue for a year and had saw GI, patient states that she previously had a swallow test and there was no concerns from results/    Subjective    HPI HPI    Neck Pain    This is a new problem.  There was not an injury that may have caused the pain.  Recent episode started in the past 7 days.   Pain is present in the  right anterior neck.  Pain occurs daily.  The symptoms are aggravated by flexion , extension and turning left.  Symptoms are relieved by nothing.  Treatments: prescription pain relievers.  Treatment provided mild relief.  Chest pain: Absent.  Fever: Absent.  Headaches: Absent.  Joint pains: Absent.  Numbness: Absent.  Sore throat: Absent.  Swallowing problems: Present.  Tingling: Absent.  Weakness: Absent.          Dysphagia     Additional comments: Patient presents with concerns of dysphagia, she states that this has been an ongoing issue for a year and had saw GI, patient states that she previously had a swallow test and there was no concerns from results/        Last edited by Minette Headland, CMA on 01/05/2021  8:40 AM. (History)      She reports she has had trouble swallowing in the past. She had barium swallow. She also had endoscopy.  She also saw ENT.  Rohini Raeanne Gathers, MD  06/25/2019 5:03 PM EDT      Results released via my chart. Recommend to schedule video capsule endoscopy Diagnosis: unexplained iron deficiency, normal EGD and colonoscopy  Rohini Vanga   She did not do video capsule.   She reports she has " something in throat " that need to go down.   Patient  denies any fever, body  aches,chills, rash, chest pain, shortness of breath, nausea, vomiting, or diarrhea.  Denies dizziness, lightheadedness, pre syncopal or syncopal episodes.      Medications: Outpatient Medications Prior to Visit  Medication Sig  . albuterol (VENTOLIN HFA) 108 (90 Base) MCG/ACT inhaler Inhale 2 puffs into the lungs every 6 (six) hours as needed for wheezing or shortness of breath.  . ALPRAZolam (XANAX) 0.5 MG tablet TAKE 1 TABLET (0.5 MG TOTAL) BY MOUTH 2 (TWO) TIMES DAILY AS NEEDED.  . Ascorbic Acid (VITAMIN C PO) Take by mouth.  . citalopram (CELEXA) 40 MG tablet Take 1 tablet (40 mg total) by mouth daily.  . Cyanocobalamin (VITAMIN B 12 PO) Take by mouth.  . cyclobenzaprine (FLEXERIL) 5 MG tablet Take 1 tablet (5 mg total) by mouth 3 (three) times daily as needed for muscle spasms.  . diphenhydrAMINE (BENADRYL) 25 MG tablet Take 25 mg by mouth at bedtime as needed for sleep.  Marland Kitchen gabapentin (NEURONTIN) 300 MG capsule Take 1 capsule (300 mg total) by mouth 2 (two) times daily.  Marland Kitchen lidocaine (LIDODERM) 5 % Place 1 patch onto the skin daily. Remove & Discard patch within 12 hours or as directed by MD  . loratadine (CLARITIN) 10 MG tablet Take 10 mg by mouth daily as  needed.   . meloxicam (MOBIC) 7.5 MG tablet TAKE 1 TABLET BY MOUTH EVERY DAY  . metoprolol succinate (TOPROL-XL) 25 MG 24 hr tablet Take 1 tablet (25 mg total) by mouth daily.  Marland Kitchen nystatin (MYCOSTATIN/NYSTOP) powder Apply 1 application topically 3 (three) times daily.  . OMEGA-3 FATTY ACIDS PO   . propranolol (INDERAL) 10 MG tablet TAKE 1 TABLET (10 MG TOTAL) BY MOUTH 4 (FOUR) TIMES DAILY AS NEEDED (FAST HEART RATE/PALPITATIONS). Please make overdue appt with Dr. Acie Fredrickson. 3rd and Final Attempt  . triamcinolone (KENALOG) 0.1 % Apply 1 application topically 2 (two) times daily.  . Vitamin D, Ergocalciferol, (DRISDOL) 1.25 MG (50000 UNIT) CAPS capsule TAKE ONE CAPSULE BY MOUTH EVERY 7 DAYS  . zolpidem (AMBIEN) 5 MG tablet Take 0.5-1  tablets (2.5-5 mg total) by mouth at bedtime as needed for sleep (do not take within 6 hours of xanax.).  Marland Kitchen omeprazole (PRILOSEC) 40 MG capsule TAKE 1 CAPSULE (40 MG TOTAL) BY MOUTH 2 (TWO) TIMES DAILY BEFORE A MEAL.  . [DISCONTINUED] doxycycline (VIBRA-TABS) 100 MG tablet Take 1 tablet (100 mg total) by mouth 2 (two) times daily.   No facility-administered medications prior to visit.    Review of Systems  Constitutional: Negative.   Respiratory: Negative.   Cardiovascular: Negative.   Genitourinary: Negative.        Objective    BP 132/77   Pulse 93   Resp 16   Wt 217 lb (98.4 kg)   SpO2 100%   BMI 36.11 kg/m  BP Readings from Last 3 Encounters:  01/05/21 132/77  11/25/20 120/80  10/15/20 121/83   Wt Readings from Last 3 Encounters:  01/05/21 217 lb (98.4 kg)  11/25/20 214 lb (97.1 kg)  10/15/20 215 lb (97.5 kg)       Physical Exam Vitals reviewed.  Constitutional:      General: She is not in acute distress.    Appearance: She is well-developed. She is not diaphoretic.     Interventions: She is not intubated. HENT:     Head: Normocephalic and atraumatic.     Right Ear: External ear normal.     Left Ear: External ear normal.     Nose: Nose normal.     Mouth/Throat:     Pharynx: No oropharyngeal exudate.  Eyes:     General: Lids are normal. No scleral icterus.       Right eye: No discharge.        Left eye: No discharge.     Conjunctiva/sclera: Conjunctivae normal.     Right eye: Right conjunctiva is not injected. No exudate or hemorrhage.    Left eye: Left conjunctiva is not injected. No exudate or hemorrhage.    Pupils: Pupils are equal, round, and reactive to light.  Neck:     Thyroid: No thyroid mass or thyromegaly.     Vascular: Normal carotid pulses. No carotid bruit, hepatojugular reflux or JVD.     Trachea: Trachea and phonation normal. No tracheal tenderness or tracheal deviation.     Meningeal: Brudzinski's sign and Kernig's sign absent.   Cardiovascular:     Rate and Rhythm: Normal rate and regular rhythm.     Pulses: Normal pulses.          Radial pulses are 2+ on the right side and 2+ on the left side.       Dorsalis pedis pulses are 2+ on the right side and 2+ on the left side.  Posterior tibial pulses are 2+ on the right side and 2+ on the left side.     Heart sounds: Normal heart sounds, S1 normal and S2 normal. Heart sounds not distant. No murmur heard. No friction rub. No gallop.   Pulmonary:     Effort: Pulmonary effort is normal. No tachypnea, bradypnea, accessory muscle usage or respiratory distress. She is not intubated.     Breath sounds: Normal breath sounds. No stridor. No wheezing or rales.  Chest:     Chest wall: No tenderness.  Breasts:     Right: No supraclavicular adenopathy.     Left: No supraclavicular adenopathy.    Abdominal:     General: Bowel sounds are normal. There is no distension or abdominal bruit.     Palpations: Abdomen is soft. There is no shifting dullness, fluid wave, hepatomegaly, splenomegaly, mass or pulsatile mass.     Tenderness: There is no abdominal tenderness. There is no guarding or rebound.     Hernia: No hernia is present.  Musculoskeletal:        General: No tenderness or deformity. Normal range of motion.     Cervical back: Full passive range of motion without pain, normal range of motion and neck supple. No edema, erythema or rigidity. No spinous process tenderness or muscular tenderness. Normal range of motion.  Lymphadenopathy:     Head:     Right side of head: No submental, submandibular, tonsillar, preauricular, posterior auricular or occipital adenopathy.     Left side of head: No submental, submandibular, tonsillar, preauricular, posterior auricular or occipital adenopathy.     Cervical: No cervical adenopathy.     Right cervical: No superficial, deep or posterior cervical adenopathy.    Left cervical: No superficial, deep or posterior cervical adenopathy.      Upper Body:     Right upper body: No supraclavicular or pectoral adenopathy.     Left upper body: No supraclavicular or pectoral adenopathy.  Skin:    General: Skin is warm and dry.     Coloration: Skin is not pale.     Findings: No abrasion, bruising, burn, ecchymosis, erythema, lesion, petechiae or rash.     Nails: There is no clubbing.  Neurological:     Mental Status: She is alert and oriented to person, place, and time.     GCS: GCS eye subscore is 4. GCS verbal subscore is 5. GCS motor subscore is 6.     Cranial Nerves: No cranial nerve deficit.     Sensory: No sensory deficit.     Motor: No tremor, atrophy, abnormal muscle tone or seizure activity.     Coordination: Coordination normal.     Gait: Gait normal.     Deep Tendon Reflexes: Reflexes are normal and symmetric. Reflexes normal. Babinski sign absent on the right side. Babinski sign absent on the left side.     Reflex Scores:      Tricep reflexes are 2+ on the right side and 2+ on the left side.      Bicep reflexes are 2+ on the right side and 2+ on the left side.      Brachioradialis reflexes are 2+ on the right side and 2+ on the left side.      Patellar reflexes are 2+ on the right side and 2+ on the left side.      Achilles reflexes are 2+ on the right side and 2+ on the left side. Psychiatric:  Speech: Speech normal.        Behavior: Behavior normal.        Thought Content: Thought content normal.        Judgment: Judgment normal.     No results found for any visits on 01/05/21.  Assessment & Plan    Dysphagia, unspecified type - Plan: CBC with Differential/Platelet, Comprehensive Metabolic Panel (CMET), VITAMIN D 25 Hydroxy (Vit-D Deficiency, Fractures), TSH, Ambulatory referral to ENT  Vitamin D deficiency - Plan: VITAMIN D 25 Hydroxy (Vit-D Deficiency, Fractures)  Orders Placed This Encounter  Procedures  . CBC with Differential/Platelet  . Comprehensive Metabolic Panel (CMET)  . VITAMIN D 25  Hydroxy (Vit-D Deficiency, Fractures)  . TSH  . Ambulatory referral to ENT    Red Flags discussed. The patient was given clear instructions to go to ER or return to medical center if any red flags develop, symptoms do not improve, worsen or new problems develop. They verbalized understanding.   She is already established with gastroenterology and will call them for follow up it was advised she have swallowing pill study done at her last Gastroenterology appointment. Return in about 1 month (around 02/04/2021), or if symptoms worsen or fail to improve, for at any time for any worsening symptoms, Go to Emergency room/ urgent care if worse.      The entirety of the information documented in the History of Present Illness, Review of Systems and Physical Exam were personally obtained by me. Portions of this information were initially documented by the CMA and reviewed by me for thoroughness and accuracy.     Marcille Buffy, [place] [phone] (phone) [phone] (fax)  [place]

## 2021-01-05 NOTE — Patient Instructions (Addendum)
Bradley's Neurology in Clinical Practice (8th ed., pp. 152-163). [place], [place]: Elsevier."> [employer] of [employer] (21st ed., pp. 1056-1078). [place], [place]: [employer].">  Dysphagia  Dysphagia is trouble swallowing. This condition occurs when solids and liquids stick in a person's throat on the way down to the stomach, or when food takes longer to get to the stomach than usual. You may have problems swallowing food, liquids, or both. You may also have pain while trying to swallow. It may take you more time and effort to swallow something. What are the causes? This condition may be caused by:  Muscle problems. These may make it difficult for you to move food and liquids through the esophagus, which is the tube that connects your mouth to your stomach.  Blockages. You may have ulcers, scar tissue, or inflammation that blocks the normal passage of food and liquids. Causes of these problems include: ? Acid reflux from your stomach into your esophagus (gastroesophageal reflux). ? Infections. ? Radiation treatment for cancer. ? Medicines taken without enough fluids to wash them down into your stomach.  Stroke. This can affect the nerves and make it difficult to swallow.  Nerve problems. These prevent signals from being sent to the muscles of your esophagus to squeeze (contract) and move what you swallow down to your stomach.  Globus pharyngeus. This is a common problem that involves a feeling like something is stuck in your throat or a sense of trouble with swallowing, even though nothing is wrong with the swallowing passages.  Certain conditions, such as cerebral palsy or Parkinson's disease. What are the signs or symptoms? Common symptoms of this condition include:  A feeling that solids or liquids are stuck in your throat on the way down to the stomach.  Pain while swallowing.  Coughing or gagging while trying to swallow. Other symptoms include:  Food moving back from  your stomach to your mouth (regurgitation).  Noises coming from your throat.  Chest discomfort when swallowing.  A feeling of fullness when swallowing.  Drooling, especially when the throat is blocked.  Heartburn. How is this diagnosed? This condition may be diagnosed by:  Barium swallow X-Nordahl. In this test, you will swallow a white liquid that sticks to the inside of your esophagus. X-Suminski images are then taken.  Endoscopy. In this test, a flexible telescope is inserted down your throat to look at your esophagus and your stomach.  CT scans or an MRI. How is this treated? Treatment for dysphagia depends on the cause of this condition:  If the dysphagia is caused by acid reflux or infection, medicines may be used. These may include antibiotics or heartburn medicines.  If the dysphagia is caused by problems with the muscles, swallowing therapy may be used to help you strengthen your swallowing muscles. You may have to do specific exercises to strengthen the muscles or stretch them.  If the dysphagia is caused by a blockage or mass, procedures to remove the blockage may be done. You may need surgery and a feeding tube. You may need to make diet changes. Ask your health care provider for specific instructions. Follow these instructions at home: Medicines  Take over-the-counter and prescription medicines only as told by your health care provider.  If you were prescribed an antibiotic medicine, take it as told by your health care provider. Do not stop taking the antibiotic even if you start to feel better. Eating and drinking  Make any diet changes as told by your health care provider.    Work with a diet and nutrition specialist (dietitian) to create an eating plan that will help you get the nutrients you need in order to stay healthy.  Eat soft foods that are easier to swallow.  Cut your food into small pieces and eat slowly. Take small bites.  Eat and drink only when you are  sitting upright.  Do not drink alcohol or caffeine. If you need help quitting, ask your health care provider.   General instructions  Check your weight every day to make sure you are not losing weight.  Do not use any products that contain nicotine or tobacco. These products include cigarettes, chewing tobacco, and vaping devices, such as e-cigarettes. If you need help quitting, ask your health care provider.  Keep all follow-up visits. This is important. Contact a health care provider if:  You lose weight because you cannot swallow.  You cough when you drink liquids.  You cough up partially digested food. Get help right away if:  You cannot swallow your saliva.  You have shortness of breath, a fever, or both.  Your voice is hoarse and you have trouble swallowing. These symptoms may represent a serious problem that is an emergency. Do not wait to see if the symptoms will go away. Get medical help right away. Call your local emergency services (911 in the [place] Do not drive yourself to the hospital. Summary  Dysphagia is trouble swallowing. This condition occurs when solids and liquids stick in a person's throat on the way down to the stomach. You may cough or gag while trying to swallow.  Dysphagia has many possible causes.  Treatment for dysphagia depends on the cause of the condition.  Keep all follow-up visits. This is important. This information is not intended to replace advice given to you by your health care provider. Make sure you discuss any questions you have with your health care provider. Document Revised: 04/26/2020 Document Reviewed: 04/26/2020 Elsevier Patient Education  2021 [place]. Levocetirizine Oral Tablets What is this medicine? LEVOCETIRIZINE (LEE voe se TIR i zeen) is an antihistamine. This medicine is used to treat or prevent symptoms of allergies. It is also used to help reduce itchy skin rash and hives. This medicine may be used for other  purposes; ask your health care provider or pharmacist if you have questions. COMMON BRAND NAME(S): Xyzal, Xyzal Allergy 24 Hour What should I tell my health care provider before I take this medicine? They need to know if you have any of these conditions:  kidney disease  an unusual or allergic reaction to levocetirizine, cetirizine, hydroxyzine, other medicines, foods, dyes, or preservatives  pregnant or trying to get pregnant  breast-feeding How should I use this medicine? Take this medicine by mouth with a glass of water. Take it at night. The tablet may be split in half. Do not chew the tablets. Follow the directions on the prescription label. You can take it with or without food. Do not take more medicine than directed. You may need to take this medicine for several days before your symptoms improve. Talk to your pediatrician regarding the use of this medicine in children. While this drug may be prescribed for children as young as 37 years old for selected conditions, precautions do apply. Overdosage: If you think you have taken too much of this medicine contact a poison control center or emergency room at once. NOTE: This medicine is only for you. Do not share this medicine with others. What if I miss a  dose? If you miss a dose, take it as soon as you can. If it is almost time for your next dose, take only that dose. Do not take double or extra doses. What may interact with this medicine?  alcohol  MAOIs like Carbex, Eldepryl, Marplan, Nardil, and Parnate  medicines that cause drowsiness or sleep  other medicines for colds or allergies  ritonavir  theophylline This list may not describe all possible interactions. Give your health care provider a list of all the medicines, herbs, non-prescription drugs, or dietary supplements you use. Also tell them if you smoke, drink alcohol, or use illegal drugs. Some items may interact with your medicine. What should I watch for while using  this medicine? Visit your doctor or health care professional for regular checks on your health. Tell your doctor or healthcare professional if your symptoms do not start to get better or if they get worse. You may get drowsy or dizzy. Do not drive, use machinery, or do anything that needs mental alertness until you know how this medicine affects you. Do not stand or sit up quickly, especially if you are an older patient. This reduces the risk of dizzy or fainting spells. Alcohol may interfere with the effect of this medicine. Avoid alcoholic drinks. Your mouth may get dry. Chewing sugarless gum or sucking hard candy, and drinking plenty of water may help. Contact your doctor if the problem does not go away or is severe. What side effects may I notice from receiving this medicine? Side effects that you should report to your doctor or health care professional as soon as possible:  allergic reactions like skin rash, itching or hives, swelling of the face, lips, or tongue  changes in vision or hearing  fever  trouble passing urine or change in the amount of urine Side effects that usually do not require medical attention (report to your doctor or health care professional if they continue or are bothersome):  cough  dizziness  drowsiness or tiredness  dry mouth  muscle aches This list may not describe all possible side effects. Call your doctor for medical advice about side effects. You may report side effects to FDA at 1-800-FDA-1088. Where should I keep my medicine? Keep out of the reach of children. Store at room temperature between 15 and 30 degrees C (59 and 86 degrees F). Throw away any unused medicine after the expiration date. NOTE: This sheet is a summary. It may not cover all possible information. If you have questions about this medicine, talk to your doctor, pharmacist, or health care provider.  2021 Elsevier/Gold Standard (2008-05-29 11:17:47)  Bradley's Neurology in [place] (8th ed., pp. 144-315). [place], [place]: Elsevier."> [employer] of Surgery (21st ed., pp. [phone]). [place], [place]: Elsevier, Inc.">  Dysphagia  Dysphagia is trouble swallowing. This condition occurs when solids and liquids stick in a person's throat on the way down to the stomach, or when food takes longer to get to the stomach than usual. You may have problems swallowing food, liquids, or both. You may also have pain while trying to swallow. It may take you more time and effort to swallow something. What are the causes? This condition may be caused by:  Muscle problems. These may make it difficult for you to move food and liquids through the esophagus, which is the tube that connects your mouth to your stomach.  Blockages. You may have ulcers, scar tissue, or inflammation that blocks the normal passage of food and liquids. Causes  of these problems include: ? Acid reflux from your stomach into your esophagus (gastroesophageal reflux). ? Infections. ? Radiation treatment for cancer. ? Medicines taken without enough fluids to wash them down into your stomach.  Stroke. This can affect the nerves and make it difficult to swallow.  Nerve problems. These prevent signals from being sent to the muscles of your esophagus to squeeze (contract) and move what you swallow down to your stomach.  Globus pharyngeus. This is a common problem that involves a feeling like something is stuck in your throat or a sense of trouble with swallowing, even though nothing is wrong with the swallowing passages.  Certain conditions, such as cerebral palsy or Parkinson's disease. What are the signs or symptoms? Common symptoms of this condition include:  A feeling that solids or liquids are stuck in your throat on the way down to the stomach.  Pain while swallowing.  Coughing or gagging while trying to swallow. Other symptoms include:  Food moving back from your stomach to your mouth  (regurgitation).  Noises coming from your throat.  Chest discomfort when swallowing.  A feeling of fullness when swallowing.  Drooling, especially when the throat is blocked.  Heartburn. How is this diagnosed? This condition may be diagnosed by:  Barium swallow X-ray. In this test, you will swallow a white liquid that sticks to the inside of your esophagus. X-ray images are then taken.  Endoscopy. In this test, a flexible telescope is inserted down your throat to look at your esophagus and your stomach.  CT scans or an MRI. How is this treated? Treatment for dysphagia depends on the cause of this condition:  If the dysphagia is caused by acid reflux or infection, medicines may be used. These may include antibiotics or heartburn medicines.  If the dysphagia is caused by problems with the muscles, swallowing therapy may be used to help you strengthen your swallowing muscles. You may have to do specific exercises to strengthen the muscles or stretch them.  If the dysphagia is caused by a blockage or mass, procedures to remove the blockage may be done. You may need surgery and a feeding tube. You may need to make diet changes. Ask your health care provider for specific instructions. Follow these instructions at home: Medicines  Take over-the-counter and prescription medicines only as told by your health care provider.  If you were prescribed an antibiotic medicine, take it as told by your health care provider. Do not stop taking the antibiotic even if you start to feel better. Eating and drinking  Make any diet changes as told by your health care provider.  Work with a diet and nutrition specialist (dietitian) to create an eating plan that will help you get the nutrients you need in order to stay healthy.  Eat soft foods that are easier to swallow.  Cut your food into small pieces and eat slowly. Take small bites.  Eat and drink only when you are sitting upright.  Do not  drink alcohol or caffeine. If you need help quitting, ask your health care provider.   General instructions  Check your weight every day to make sure you are not losing weight.  Do not use any products that contain nicotine or tobacco. These products include cigarettes, chewing tobacco, and vaping devices, such as e-cigarettes. If you need help quitting, ask your health care provider.  Keep all follow-up visits. This is important. Contact a health care provider if:  You lose weight because you cannot swallow.  You cough when you drink liquids.  You cough up partially digested food. Get help right away if:  You cannot swallow your saliva.  You have shortness of breath, a fever, or both.  Your voice is hoarse and you have trouble swallowing. These symptoms may represent a serious problem that is an emergency. Do not wait to see if the symptoms will go away. Get medical help right away. Call your local emergency services (911 in the [place] Do not drive yourself to the hospital. Summary  Dysphagia is trouble swallowing. This condition occurs when solids and liquids stick in a person's throat on the way down to the stomach. You may cough or gag while trying to swallow.  Dysphagia has many possible causes.  Treatment for dysphagia depends on the cause of the condition.  Keep all follow-up visits. This is important. This information is not intended to replace advice given to you by your health care provider. Make sure you discuss any questions you have with your health care provider. Document Revised: 04/26/2020 Document Reviewed: 04/26/2020 Elsevier Patient Education  2021 [place].  Dysphagia Eating Plan, Bite Size Food This eating plan is for people with moderate swallowing problems who have transitioned from pureed and minced foods. Bite size foods are soft and cut into small chunks so that they can be swallowed safely. On this eating plan, you may be instructed to drink  liquids that are thickened. Work with your health care provider and your diet and nutrition specialist (dietitian) to make sure that you are following the eating plan safely and getting all the nutrients you need. What are tips for following this plan? General information  You may eat foods that are tender, soft, and moist.  Always test food texture before taking a bite. Poke food with a fork or spoon to make sure it is tender. The test sample should squash, break apart, or change shape, and it should not return to its original shape when the fork or spoon is removed.  Food should be easy to cut and chew. Avoid large pieces of food that require a lot of chewing.  Take small bites. Each bite should be smaller than your thumb nail (about 15 mm by 15 mm).  If you were on a pureed or minced food eating plan, you may eat any of the foods included in those diets.  Avoid foods that are very dry, hard, sticky, chewy, coarse, or crunchy.  If instructed by your health care provider, thicken liquids. Follow your health care provider's instructions for what products to use, how to do this, and to what thickness. ? Your health care provider may recommend using a commercial thickener, rice cereal, or potato flakes. Ask your health care provider to recommend thickeners. ? Thickened liquids are usually a "pudding-like" consistency, or they may be as thick as honey or thick enough to eat with a spoon.   Cooking  To moisten foods, you may add liquids while you are blending, mashing, or grinding your foods to the right consistency. These liquids include gravies, sauces, vegetable or fruit juice, milk, half and half, or water.  Strain extra liquid from foods before eating.  Reheat foods slowly to prevent a tough crust from forming.  Prepare foods in advance. Meal planning  Eat a variety of foods to get all the nutrients you need.  Some foods may be tolerated better than others. Work with your dietitian  to identify which foods are safest for you to eat.  Follow  your meal plan as told by your dietitian. What foods can I eat? Grains Moist breads without nuts or seeds. Biscuits, muffins, pancakes, and waffles that are well-moistened with syrup, jelly, margarine, or butter. Cooked cereals. Moist bread stuffing. Moist rice. Well-moistened cold cereal with small chunks. Well-cooked pasta, noodles, rice, and bread dressing in small pieces and thick sauce. Soft dumplings or spaetzle in small pieces and butter or gravy. Fruits Canned or cooked fruits that are soft or moist and do not have skin or seeds. Fresh, soft bananas. Thickened fruit juices. Vegetables Soft, well-cooked vegetables in small pieces. Soft-cooked, mashed potatoes. Thickened vegetable juice. Meats and other proteins Tender, moist meats or poultry in small pieces. Moist meatballs or meatloaf. Fish without bones. Eggs or egg substitutes in small pieces. Tofu. Tempeh and meat alternatives in small pieces. Well-cooked, tender beans, peas, baked beans, and other legumes. Dairy Thickened milk. Cream cheese. Yogurt. Cottage cheese. Sour cream. Small pieces of soft cheese. Fats and oils Butter. Oils. Margarine. Mayonnaise. Gravy. Spreads. Sweets and desserts Soft, smooth, moist desserts. Pudding. Custard. Moist cakes. Jam. Jelly. Honey. Preserves. Ask your health care provider whether you can have frozen desserts. Seasonings and other foods All seasonings and sweeteners. All sauces with small chunks. Prepared tuna, egg, or chicken salad without raw fruits or vegetables. Moist casseroles with small, tender pieces of meat. Soups with tender meat. The items listed above may not be a complete list of foods and beverages you can eat. Contact a dietitian for more information. What foods must I avoid? Grains Coarse or dry cereals. Dry breads. Toast. Crackers. Tough, crusty breads, such as [place] bread and baguettes. Dry pancakes, waffles, and  muffins. Sticky rice. Dry bread stuffing. Granola. Popcorn. Chips. Fruits Hard, crunchy, stringy, high-pulp, and juicy raw fruits such as apples, pineapple, papaya, and watermelon. Small, round fruits, such as grapes. Dried fruit and fruit leather. Vegetables All raw vegetables. Cooked corn. Rubbery or stiff cooked vegetables. Stringy vegetables, such as celery. Tough, crisp fried potatoes. Potato skins. Meats and other proteins Large pieces of meat. Dry, tough meats, such as bacon, sausage, and hot dogs. Chicken, [place], or fish with skin and bones. Crunchy peanut butter. Nuts. Seeds. Nut and seed butters. Dairy Yogurt with nuts, seeds, or large chunks. Large chunks of cheese. Sweets and desserts Dry cakes. Chewy or dry cookies. Any desserts with nuts, seeds, dry fruits, coconut, pineapple, or anything dry, sticky, or hard. Chewy caramel. Licorice. Taffy-type candies. Ask your health care provider whether you can have frozen desserts. Seasonings and other foods Soups with tough or large chunks of meats, poultry, or vegetables. Corn or clam chowder. Smoothies with large chunks of fruit. The items listed above may not be a complete list of foods and beverages you should avoid. Contact a dietitian for more information. Summary  Bite size foods can be helpful for people with moderate swallowing problems.  On this dysphagia eating plan, you may eat foods that are soft, moist, and cut into pieces smaller than your thumb nail (about 15 mm by 15 mm).  You may be instructed to thicken liquids. Follow your health care provider's instructions about how to do this and to what consistency. This information is not intended to replace advice given to you by your health care provider. Make sure you discuss any questions you have with your health care provider. Document Revised: 05/26/2020 Document Reviewed: 05/26/2020 Elsevier Patient Education  2021 [employer].

## 2021-01-06 LAB — COMPREHENSIVE METABOLIC PANEL
ALT: 19 IU/L (ref 0–32)
AST: 21 IU/L (ref 0–40)
Albumin/Globulin Ratio: 1.6 (ref 1.2–2.2)
Albumin: 4.1 g/dL (ref 3.8–4.8)
Alkaline Phosphatase: 88 IU/L (ref 44–121)
BUN/Creatinine Ratio: 12 (ref 9–23)
BUN: 9 mg/dL (ref 6–24)
Bilirubin Total: 0.2 mg/dL (ref 0.0–1.2)
CO2: 24 mmol/L (ref 20–29)
Calcium: 9.3 mg/dL (ref 8.7–10.2)
Chloride: 102 mmol/L (ref 96–106)
Creatinine, Ser: 0.75 mg/dL (ref 0.57–1.00)
Globulin, Total: 2.6 g/dL (ref 1.5–4.5)
Glucose: 93 mg/dL (ref 65–99)
Potassium: 4.3 mmol/L (ref 3.5–5.2)
Sodium: 143 mmol/L (ref 134–144)
Total Protein: 6.7 g/dL (ref 6.0–8.5)
eGFR: 97 mL/min/{1.73_m2} (ref >59)

## 2021-01-06 LAB — CBC WITH DIFFERENTIAL/PLATELET
Basophils Absolute: 0 10*3/uL (ref 0.0–0.2)
Basos: 1 %
EOS (ABSOLUTE): 0.1 10*3/uL (ref 0.0–0.4)
Eos: 2 %
Hematocrit: 36.6 % (ref 34.0–46.6)
Hemoglobin: 11.5 g/dL (ref 11.1–15.9)
Immature Grans (Abs): 0 10*3/uL (ref 0.0–0.1)
Immature Granulocytes: 1 %
Lymphocytes Absolute: 1.5 10*3/uL (ref 0.7–3.1)
Lymphs: 35 %
MCH: 23 pg — ABNORMAL LOW (ref 26.6–33.0)
MCHC: 31.4 g/dL — ABNORMAL LOW (ref 31.5–35.7)
MCV: 73 fL — ABNORMAL LOW (ref 79–97)
Monocytes Absolute: 0.4 10*3/uL (ref 0.1–0.9)
Monocytes: 9 %
Neutrophils Absolute: 2.3 10*3/uL (ref 1.4–7.0)
Neutrophils: 52 %
Platelets: 279 10*3/uL (ref 150–450)
RBC: 4.99 x10E6/uL (ref 3.77–5.28)
RDW: 14.8 % (ref 11.7–15.4)
WBC: 4.4 10*3/uL (ref 3.4–10.8)

## 2021-01-06 LAB — TSH: TSH: 1.51 u[IU]/mL (ref 0.450–4.500)

## 2021-01-06 LAB — VITAMIN D 25 HYDROXY (VIT D DEFICIENCY, FRACTURES): Vit D, 25-Hydroxy: 38.5 ng/mL (ref 30.0–100.0)

## 2021-01-06 NOTE — Progress Notes (Signed)
Please add on TIBC, iron , ferritin  for decreased MCH. MCV.  Labs otherwise within normal limits.  Vitamin D now within normal, recommend over the counter vitamin d 3 at 4,000 international units daily by mouth and recheck Vitamin D 4- 6 months.

## 2021-01-07 LAB — IRON,TIBC AND FERRITIN PANEL
Ferritin: 13 ng/mL — ABNORMAL LOW (ref 15–150)
Iron Saturation: 8 % — CL (ref 15–55)
Iron: 35 ug/dL (ref 27–159)
Total Iron Binding Capacity: 420 ug/dL (ref 250–450)
UIBC: 385 ug/dL (ref 131–425)

## 2021-01-07 LAB — SPECIMEN STATUS REPORT

## 2021-01-07 NOTE — Progress Notes (Signed)
Iron saturation and ferritin still low. Recommend hematology consult. Please place referral if patient is in agreement.

## 2021-01-08 ENCOUNTER — Telehealth: Payer: Self-pay

## 2021-01-08 DIAGNOSIS — D508 Other iron deficiency anemias: Secondary | ICD-10-CM

## 2021-01-08 NOTE — Telephone Encounter (Signed)
-----   Message from Romana Juniper, RN sent at 01/08/2021  9:48 AM EDT ----- Patient notified of lab results- she would like to do the referral- although she is concerned about crossing paths with her mother at the cancer center- if providers work from there. ( She doesn't want her mother to worry- she is receiving treatment at cancer center)

## 2021-01-14 ENCOUNTER — Telehealth: Payer: Self-pay | Admitting: Hematology and Oncology

## 2021-01-14 NOTE — Telephone Encounter (Signed)
[place] was referred for IDA by Dr. Brita Romp. She returned my call and has been scheduled to see Dr. Lindi Adie on 5/9 at 3;15pm. Pt aware to arrive 15 minutes early.

## 2021-01-25 NOTE — Progress Notes (Signed)
[place] NOTE  Patient Care Team: Bacigalupo, Dionne Bucy, MD as PCP - General (Family Medicine) Nahser, Wonda Cheng, MD as PCP - Cardiology (Cardiology)  CHIEF COMPLAINTS/PURPOSE OF CONSULTATION:  Newly diagnosed iron deficiency anemia  HISTORY OF PRESENTING ILLNESS:  Virginia Woods 50 y.o. female is here because of recent diagnosis of iron deficiency anemia. Labs on 01/05/21 showed Hg 11.5, HCT 36.6, platelets 279, iron saturation 8%, ferritin 13. She presents to the clinic today for follow-up.   I reviewed her records extensively and collaborated the history with the patient.  MEDICAL HISTORY:  Past Medical History:  Diagnosis Date  . Anemia   . Anxiety   . Complication of anesthesia    pt reports some complication after 2nd brain surgery, but unsure of what it was.  . Dental bridge present    Perrmanent dental retainer - bottom  . Depression   . Difficult intubation    with 2nd chiari surgery (per pt)  . Family history of adverse reaction to anesthesia    reports mother had a "negative reaction" to "anesthesia or lidocaine"  . Headache   . History of Chiari malformation 2011   2 surgeries 2011 [facility]  . Vitamin B12 deficiency   . Vitamin D deficiency   . Wears contact lenses     SURGICAL HISTORY: Past Surgical History:  Procedure Laterality Date  . BRAIN SURGERY  2011   2 surgeries for chiari malformation - [facility]  . CESAREAN SECTION  1996  . COLONOSCOPY  2014  . COLONOSCOPY WITH PROPOFOL N/A 06/20/2019   Procedure: COLONOSCOPY WITH PROPOFOL;  Surgeon: Lin Landsman, MD;  Location: [facility];  Service: Endoscopy;  Laterality: N/A;  . ESOPHAGOGASTRODUODENOSCOPY (EGD) WITH PROPOFOL N/A 06/20/2019   Procedure: ESOPHAGOGASTRODUODENOSCOPY (EGD) WITH PROPOFOL;  Surgeon: Lin Landsman, MD;  Location: [place];  Service: Endoscopy;  Laterality: N/A;  . TUBAL LIGATION  2002    SOCIAL HISTORY: Social History    Socioeconomic History  . Marital status: Married    Spouse name: Not on file  . Number of children: Not on file  . Years of education: Not on file  . Highest education level: Not on file  Occupational History  . Not on file  Tobacco Use  . Smoking status: Never Smoker  . Smokeless tobacco: Never Used  Vaping Use  . Vaping Use: Never used  Substance and Sexual Activity  . Alcohol use: Yes    Comment: 1-2 glasses wine per month  . Drug use: No  . Sexual activity: Not on file  Other Topics Concern  . Not on file  Social History Narrative  . Not on file   Social Determinants of Health   Financial Resource Strain: Not on file  Food Insecurity: Not on file  Transportation Needs: Not on file  Physical Activity: Not on file  Stress: Not on file  Social Connections: Not on file  Intimate Partner Violence: Not on file    FAMILY HISTORY: Family History  Problem Relation Age of Onset  . Hypertension Mother   . Cancer Mother        Laryngeal and lymphoma  . Hypertension Sister   . Heart disease Maternal Grandmother     ALLERGIES:  is allergic to doxycycline and tamiflu [oseltamivir phosphate].  MEDICATIONS:  Current Outpatient Medications  Medication Sig Dispense Refill  . albuterol (VENTOLIN HFA) 108 (90 Base) MCG/ACT inhaler Inhale 2 puffs into the lungs every 6 (six)  hours as needed for wheezing or shortness of breath. 18 g 3  . ALPRAZolam (XANAX) 0.5 MG tablet TAKE 1 TABLET (0.5 MG TOTAL) BY MOUTH 2 (TWO) TIMES DAILY AS NEEDED. 180 tablet 0  . Ascorbic Acid (VITAMIN C PO) Take by mouth.    . citalopram (CELEXA) 40 MG tablet Take 1 tablet (40 mg total) by mouth daily. 90 tablet 1  . Cyanocobalamin (VITAMIN B 12 PO) Take by mouth.    . cyclobenzaprine (FLEXERIL) 5 MG tablet Take 1 tablet (5 mg total) by mouth 3 (three) times daily as needed for muscle spasms. 30 tablet 5  . diphenhydrAMINE (BENADRYL) 25 MG tablet Take 25 mg by mouth at bedtime as needed for sleep.     Marland Kitchen gabapentin (NEURONTIN) 300 MG capsule Take 1 capsule (300 mg total) by mouth 2 (two) times daily. 180 capsule 1  . levocetirizine (XYZAL) 5 MG tablet Take 1 tablet (5 mg total) by mouth every evening. 90 tablet 0  . lidocaine (LIDODERM) 5 % Place 1 patch onto the skin daily. Remove & Discard patch within 12 hours or as directed by MD 30 patch 0  . loratadine (CLARITIN) 10 MG tablet Take 10 mg by mouth daily as needed.     . meloxicam (MOBIC) 7.5 MG tablet TAKE 1 TABLET BY MOUTH EVERY DAY 30 tablet 5  . metoprolol succinate (TOPROL-XL) 25 MG 24 hr tablet Take 1 tablet (25 mg total) by mouth daily. 90 tablet 3  . nystatin (MYCOSTATIN/NYSTOP) powder Apply 1 application topically 3 (three) times daily. 15 g 0  . OMEGA-3 FATTY ACIDS PO     . omeprazole (PRILOSEC) 40 MG capsule TAKE 1 CAPSULE (40 MG TOTAL) BY MOUTH 2 (TWO) TIMES DAILY BEFORE A MEAL. 180 capsule 1  . propranolol (INDERAL) 10 MG tablet TAKE 1 TABLET (10 MG TOTAL) BY MOUTH 4 (FOUR) TIMES DAILY AS NEEDED (FAST HEART RATE/PALPITATIONS). Please make overdue appt with Dr. Acie Fredrickson. 3rd and Final Attempt 30 tablet 0  . triamcinolone (KENALOG) 0.1 % Apply 1 application topically 2 (two) times daily. 30 g 0  . Vitamin D, Ergocalciferol, (DRISDOL) 1.25 MG (50000 UNIT) CAPS capsule TAKE ONE CAPSULE BY MOUTH EVERY 7 DAYS 12 capsule 1  . zolpidem (AMBIEN) 5 MG tablet Take 0.5-1 tablets (2.5-5 mg total) by mouth at bedtime as needed for sleep (do not take within 6 hours of xanax.). 30 tablet 0   No current facility-administered medications for this visit.    REVIEW OF SYSTEMS:   Constitutional: Denies fevers, chills or abnormal night sweats   All other systems were reviewed with the patient and are negative.  PHYSICAL EXAMINATION: ECOG PERFORMANCE STATUS: 1 - Symptomatic but completely ambulatory  Vitals:   01/26/21 1520  BP: 136/78  Pulse: 89  Resp: 17  Temp: 98.1 F (36.7 C)  SpO2: 100%   Filed Weights   01/26/21 1520  Weight: 216  lb (98 kg)      LABORATORY DATA:  I have reviewed the data as listed Lab Results  Component Value Date   WBC 4.4 01/05/2021   HGB 11.5 01/05/2021   HCT 36.6 01/05/2021   MCV 73 (L) 01/05/2021   PLT 279 01/05/2021   Lab Results  Component Value Date   NA 143 01/05/2021   K 4.3 01/05/2021   CL 102 01/05/2021   CO2 24 01/05/2021    RADIOGRAPHIC STUDIES: I have personally reviewed the radiological reports and agreed with the findings in the report.  ASSESSMENT  AND PLAN:  Anemia, iron deficiency Lab review 2020: Iron saturation 7%, ferritin 13 2021: Iron saturation 7%, ferritin 23 01/05/2021: Iron saturation 8%, ferritin 13, TIBC 420, hemoglobin 11.5, MCV 73  Iron deficiency with only mild anemia: Longstanding Differential diagnosis blood loss versus malabsorption (she had colonoscopy 2 years ago and that was negative for bleeding, she does have couple of days of heavy menstrual cycles every month.)  Counseling: I discussed with the patient extensively about absorption of iron and the different roles that functions in the body. She had stable iron studies and is completely asymptomatic with regards to iron deficiency and therefore I recommended that she take the iron supplement on a regular basis and recheck labs in 6 months and follow-up after that with a telephone visit.  She is concerned that her mother was diagnosed with MDS and whether MDS is hereditary.  I discussed with her that it is not and her mother's MDS could have been caused by prior chemotherapy as well.  All questions were answered. The patient knows to call the clinic with any problems, questions or concerns.   Rulon Eisenmenger, MD, MPH 01/26/2021    I, Molly Dorshimer, am acting as scribe for Nicholas Lose, MD.  I have reviewed the above documentation for accuracy and completeness, and I agree with the above.

## 2021-01-26 ENCOUNTER — Telehealth: Payer: Self-pay | Admitting: Hematology and Oncology

## 2021-01-26 ENCOUNTER — Other Ambulatory Visit: Payer: Self-pay

## 2021-01-26 ENCOUNTER — Inpatient Hospital Stay
Payer: Federal, State, Local not specified - PPO | Attending: Hematology and Oncology | Admitting: Hematology and Oncology

## 2021-01-26 DIAGNOSIS — Z79899 Other long term (current) drug therapy: Secondary | ICD-10-CM | POA: Insufficient documentation

## 2021-01-26 DIAGNOSIS — D508 Other iron deficiency anemias: Secondary | ICD-10-CM | POA: Diagnosis not present

## 2021-01-26 DIAGNOSIS — D509 Iron deficiency anemia, unspecified: Secondary | ICD-10-CM | POA: Insufficient documentation

## 2021-01-26 NOTE — Assessment & Plan Note (Signed)
Lab review 2020: Iron saturation 7%, ferritin 13 2021: Iron saturation 7%, ferritin 23 01/05/2021: Iron saturation 8%, ferritin 13, TIBC 420, hemoglobin 11.5, MCV 73  Iron deficiency with only mild anemia: Longstanding Differential diagnosis blood loss versus malabsorption  Counseling: I discussed with the patient extensively about absorption of iron and the different roles that functions in the body.

## 2021-01-26 NOTE — Telephone Encounter (Signed)
Scheduled per los. Gave avs and calendar  

## 2021-01-28 ENCOUNTER — Other Ambulatory Visit: Payer: Self-pay | Admitting: Physician Assistant

## 2021-01-28 DIAGNOSIS — M5413 Radiculopathy, cervicothoracic region: Secondary | ICD-10-CM

## 2021-01-28 DIAGNOSIS — Q0700 Arnold-Chiari syndrome without spina bifida or hydrocephalus: Secondary | ICD-10-CM

## 2021-01-28 DIAGNOSIS — F331 Major depressive disorder, recurrent, moderate: Secondary | ICD-10-CM

## 2021-02-24 DIAGNOSIS — F411 Generalized anxiety disorder: Secondary | ICD-10-CM | POA: Diagnosis not present

## 2021-03-02 ENCOUNTER — Ambulatory Visit: Payer: Federal, State, Local not specified - PPO | Admitting: Adult Health

## 2021-03-04 ENCOUNTER — Ambulatory Visit: Payer: Federal, State, Local not specified - PPO | Admitting: Allergy

## 2021-03-09 ENCOUNTER — Ambulatory Visit: Payer: Federal, State, Local not specified - PPO | Admitting: Adult Health

## 2021-03-13 ENCOUNTER — Ambulatory Visit: Payer: Federal, State, Local not specified - PPO | Attending: Internal Medicine

## 2021-03-13 DIAGNOSIS — Z20822 Contact with and (suspected) exposure to covid-19: Secondary | ICD-10-CM | POA: Diagnosis not present

## 2021-03-14 LAB — NOVEL CORONAVIRUS, NAA: SARS-CoV-2, NAA: DETECTED — AB

## 2021-03-14 LAB — SARS-COV-2, NAA 2 DAY TAT

## 2021-03-21 DIAGNOSIS — Z20822 Contact with and (suspected) exposure to covid-19: Secondary | ICD-10-CM | POA: Diagnosis not present

## 2021-05-14 ENCOUNTER — Other Ambulatory Visit: Payer: Self-pay

## 2021-05-14 ENCOUNTER — Encounter: Payer: Self-pay | Admitting: Allergy

## 2021-05-14 ENCOUNTER — Ambulatory Visit: Payer: Federal, State, Local not specified - PPO | Admitting: Allergy

## 2021-05-14 VITALS — BP 130/74 | HR 100 | Temp 98.5°F | Resp 18 | Ht 65.0 in | Wt 220.0 lb

## 2021-05-14 DIAGNOSIS — T783XXD Angioneurotic edema, subsequent encounter: Secondary | ICD-10-CM | POA: Diagnosis not present

## 2021-05-14 DIAGNOSIS — T7840XD Allergy, unspecified, subsequent encounter: Secondary | ICD-10-CM | POA: Diagnosis not present

## 2021-05-14 NOTE — Patient Instructions (Addendum)
-  recommend obtaining labwork as follows: tryptase level, alpha gal panel, xanthum gum, hereditary angioedema  -environmental allergy testing today is positive to tree pollen, molds.  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  I did look at the ingredients list for [place] Woods and it does contain soybean thus this could be the trigger of the reaction symptoms. -continue avoidance of soybean -have access to self-injectable epinephrine (Epipen or AuviQ) 0.'3mg'$  at all times -follow emergency action plan in case of allergic reaction  -would recommend use of antihistamine like Allegra '180mg'$  daily as needed.  May be beneficial to take daily when going to work as likely mold exposure -pepcid also can be helpful taken alongside antihistamine like Allegra during allergic reaction.  Pepcid has secondary antihistamine properties  Follow-up in 4-6 months or sooner if needed

## 2021-05-14 NOTE — Progress Notes (Signed)
New Patient Note  RE: Taylore Grindstaff MRN: [id number] DOB: 1971-08-02 Date of Office Visit: 05/14/2021  Referring provider: Florian Buff* Primary care provider: Virginia Crews, MD  Chief Complaint: allergic reaction.  History of present illness: Virginia Woods is a 50 y.o. female presenting today for consultation for allergic reaction.  She has had some reactions at least 3 up to this point.  She states the most recent one was several months ago where she ate something from [employer] (likely was a hard taco, taco meat, cheese, mild sauce) and she noted that her lips felt funny and then they started to swell about an hour after eating.  She took benadryl and use a cold compress and it did go down by the next day.  Denies tongue involvement and no difficulty breathing.    Another episode she recalls developing a rash on her forehead and face and does not remember it being itchy.  This was over a year ago.  She does not recall any other details of this.  She had lip swelling on another occasion.  She does not remember details of this event either.  She does have picture in phone of lip swelling from November 2021.    She states she has not had Janine Limbo since.  However she has had bread, beef and dairy products since without tissue.   She states she did have a insect bite years ago and states she was told she could have had lyme disease.   She has seen dermatology before and was told she had atopic dermatitis.  She states she does have a topical ointment to use for flares.  There is Kenalog listed in her medication list.  She has used claritin and xyzal but states she does better with one than the other but does not recall which one.  She has performed nasal saline rinse in the past.  She lived with her mom 10 years ago and it was old home and states she had a lot of allergy symptoms while living there. She has since remarried and in another home now and states  she has not these issues since.    No history of asthma.  She does have an albuterol inhaler for previous bronchitis.      Review of systems past 4 weeks: Review of Systems  Constitutional: Negative.   HENT: Negative.    Eyes: Negative.   Respiratory: Negative.    Cardiovascular: Negative.   Gastrointestinal: Negative.   Musculoskeletal: Negative.   Skin: Negative.   Neurological: Negative.    All other systems negative unless noted above in HPI  Past medical history: Past Medical History:  Diagnosis Date   Anemia    Anxiety    Complication of anesthesia    pt reports some complication after 2nd brain surgery, but unsure of what it was.   Dental bridge present    Perrmanent dental retainer - bottom   Depression    Difficult intubation    with 2nd chiari surgery (per pt)   Family history of adverse reaction to anesthesia    reports mother had a "negative reaction" to "anesthesia or lidocaine"   Headache    History of Chiari malformation 2011   2 surgeries 2011 - [facility]   Vitamin B12 deficiency    Vitamin D deficiency    Wears contact lenses     Past surgical history: Past Surgical History:  Procedure Laterality Date   BRAIN SURGERY  2011   2 surgeries for chiari malformation - [place]   COLONOSCOPY  2014   COLONOSCOPY WITH PROPOFOL N/A 06/20/2019   Procedure: COLONOSCOPY WITH PROPOFOL;  Surgeon: Lin Landsman, MD;  Location: [facility];  Service: Endoscopy;  Laterality: N/A;   ESOPHAGOGASTRODUODENOSCOPY (EGD) WITH PROPOFOL N/A 06/20/2019   Procedure: ESOPHAGOGASTRODUODENOSCOPY (EGD) WITH PROPOFOL;  Surgeon: Lin Landsman, MD;  Location: [place];  Service: Endoscopy;  Laterality: N/A;   TUBAL LIGATION  2002    Family history:  Family History  Problem Relation Age of Onset   Hypertension Mother    Cancer Mother        Laryngeal and lymphoma   Hypertension Sister    Heart disease Maternal Grandmother      Social history: Lives in a home with carpeting with gas heating and central cooling.  No pets in the home.  There is no concern for water damage, mildew or roaches in the home.  She is a Pharmacist, hospital.  She denies a smoking history.   Medication List: Current Outpatient Medications  Medication Sig Dispense Refill   albuterol (VENTOLIN HFA) 108 (90 Base) MCG/ACT inhaler Inhale 2 puffs into the lungs every 6 (six) hours as needed for wheezing or shortness of breath. 18 g 3   ALPRAZolam (XANAX) 0.5 MG tablet TAKE 1 TABLET (0.5 MG TOTAL) BY MOUTH 2 (TWO) TIMES DAILY AS NEEDED. 180 tablet 0   Ascorbic Acid (VITAMIN C PO) Take by mouth.     citalopram (CELEXA) 40 MG tablet Take 1 tablet (40 mg total) by mouth daily. 90 tablet 1   Cyanocobalamin (VITAMIN B 12 PO) Take by mouth.     cyclobenzaprine (FLEXERIL) 5 MG tablet Take 1 tablet (5 mg total) by mouth 3 (three) times daily as needed for muscle spasms. 30 tablet 5   diphenhydrAMINE (BENADRYL) 25 MG tablet Take 25 mg by mouth at bedtime as needed for sleep.     gabapentin (NEURONTIN) 300 MG capsule Take 1 capsule (300 mg total) by mouth 2 (two) times daily. 180 capsule 1   levocetirizine (XYZAL) 5 MG tablet Take 1 tablet (5 mg total) by mouth every evening. 90 tablet 0   lidocaine (LIDODERM) 5 % Place 1 patch onto the skin daily. Remove & Discard patch within 12 hours or as directed by MD 30 patch 0   loratadine (CLARITIN) 10 MG tablet Take 10 mg by mouth daily as needed.      metoprolol succinate (TOPROL-XL) 25 MG 24 hr tablet Take 1 tablet (25 mg total) by mouth daily. 90 tablet 3   nystatin (MYCOSTATIN/NYSTOP) powder Apply 1 application topically 3 (three) times daily. 15 g 0   OMEGA-3 FATTY ACIDS PO      propranolol (INDERAL) 10 MG tablet TAKE 1 TABLET (10 MG TOTAL) BY MOUTH 4 (FOUR) TIMES DAILY AS NEEDED (FAST HEART RATE/PALPITATIONS). Please make overdue appt with Dr. Acie Fredrickson. 3rd and Final Attempt 30 tablet 0   triamcinolone (KENALOG) 0.1 %  Apply 1 application topically 2 (two) times daily. 30 g 0   Vitamin D, Ergocalciferol, (DRISDOL) 1.25 MG (50000 UNIT) CAPS capsule TAKE ONE CAPSULE BY MOUTH EVERY 7 DAYS 12 capsule 1   zolpidem (AMBIEN) 5 MG tablet Take 0.5-1 tablets (2.5-5 mg total) by mouth at bedtime as needed for sleep (do not take within 6 hours of xanax.). 30 tablet 0   omeprazole (PRILOSEC) 40 MG capsule TAKE 1 CAPSULE (40 MG TOTAL) BY  MOUTH 2 (TWO) TIMES DAILY BEFORE A MEAL. 180 capsule 1   No current facility-administered medications for this visit.    Known medication allergies: Allergies  Allergen Reactions   Doxycycline Itching and Rash   Tamiflu [Oseltamivir Phosphate] Rash     Physical examination: Blood pressure 130/74, pulse 100, temperature 98.5 F (36.9 C), temperature source Temporal, resp. rate 18, height '5\' 5"'$  (1.651 m), weight 220 lb (99.8 kg), SpO2 100 %.  General: Alert, interactive, in no acute distress. HEENT: PERRLA ,TMs pearly gray, turbinates non-edematous without discharge, post-pharynx non erythematous. Neck: Supple without lymphadenopathy. Lungs: Clear to auscultation without wheezing, rhonchi or rales. {no increased work of breathing. CV: Normal S1, S2 without murmurs. Abdomen: Nondistended, nontender. Skin: Warm and dry, without lesions or rashes. Extremities:  No clubbing, cyanosis or edema. Neuro:   Grossly intact.  Diagnositics/Labs:  Allergy testing: Environmental allergy skin prick testing is positive to [place], walnut, Aspergillus, Mucor. 10 Common food allergy skin prick testing is positive to soybean. Allergy testing results were read and interpreted by provider, documented by clinical staff.   Assessment and plan: Allergic reaction Angioedema  -recommend obtaining labwork as follows: tryptase level, alpha gal panel, xanthum gum, hereditary angioedema  -environmental allergy testing today is positive to tree pollen, molds.  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  I did look at the ingredients list for Janine Limbo and it does contain soybean thus this could be the trigger of the reaction symptoms. -continue avoidance of soybean -have access to self-injectable epinephrine (Epipen or AuviQ) 0.'3mg'$  at all times -follow emergency action plan in case of allergic reaction -would recommend use of antihistamine like Allegra '180mg'$  daily as needed.  May be beneficial to take daily when going to work as likely mold exposure -pepcid also can be helpful taken alongside antihistamine like Allegra during allergic reaction.  Pepcid has secondary antihistamine properties  Follow-up in 4-6 months or sooner if needed   I appreciate the opportunity to take part in Lamija's care. Please do not hesitate to contact me with questions.  Sincerely,   Prudy Feeler, MD Allergy/Immunology [facility]

## 2021-05-20 LAB — C4 COMPLEMENT: Complement C4, Serum: 35 mg/dL (ref 12–38)

## 2021-05-20 LAB — TRYPTASE: Tryptase: 6.7 ug/L (ref 2.2–13.2)

## 2021-05-20 LAB — ALPHA-GAL PANEL
Allergen Lamb IgE: <0.1 kU/L
Beef IgE: <0.1 kU/L
IgE (Immunoglobulin E), Serum: 23 IU/mL (ref 6–495)
O215-IgE Alpha-Gal: <0.1 kU/L
Pork IgE: <0.1 kU/L

## 2021-05-20 LAB — COMPLEMENT COMPONENT C1Q: Complement C1Q: 15.5 mg/dL (ref 10.3–20.5)

## 2021-05-20 LAB — C1 ESTERASE INHIBITOR: C1INH SerPl-mCnc: 33 mg/dL (ref 21–39)

## 2021-05-20 LAB — C1 ESTERASE INHIBITOR, FUNCTIONAL: C1INH Functional/C1INH Total MFr SerPl: >100 %mean normal

## 2021-05-21 LAB — XANTHAN GUM IGE
Class Interpretation: 0
Xanthan Gum, IgE*: <0.35 kU/L (ref <0.35)

## 2021-06-26 ENCOUNTER — Encounter: Payer: Self-pay | Admitting: Internal Medicine

## 2021-06-26 ENCOUNTER — Ambulatory Visit: Payer: Federal, State, Local not specified - PPO | Admitting: Internal Medicine

## 2021-06-26 ENCOUNTER — Other Ambulatory Visit: Payer: Self-pay

## 2021-06-26 VITALS — BP 126/80 | HR 79 | Resp 18 | Ht 65.0 in | Wt 219.6 lb

## 2021-06-26 DIAGNOSIS — I471 Supraventricular tachycardia: Secondary | ICD-10-CM | POA: Diagnosis not present

## 2021-06-26 DIAGNOSIS — E538 Deficiency of other specified B group vitamins: Secondary | ICD-10-CM

## 2021-06-26 DIAGNOSIS — Q0700 Arnold-Chiari syndrome without spina bifida or hydrocephalus: Secondary | ICD-10-CM | POA: Diagnosis not present

## 2021-06-26 DIAGNOSIS — Z1231 Encounter for screening mammogram for malignant neoplasm of breast: Secondary | ICD-10-CM

## 2021-06-26 DIAGNOSIS — F419 Anxiety disorder, unspecified: Secondary | ICD-10-CM

## 2021-06-26 DIAGNOSIS — K219 Gastro-esophageal reflux disease without esophagitis: Secondary | ICD-10-CM

## 2021-06-26 DIAGNOSIS — F331 Major depressive disorder, recurrent, moderate: Secondary | ICD-10-CM

## 2021-06-26 DIAGNOSIS — E559 Vitamin D deficiency, unspecified: Secondary | ICD-10-CM

## 2021-06-26 DIAGNOSIS — M25511 Pain in right shoulder: Secondary | ICD-10-CM

## 2021-06-26 MED ORDER — BUPROPION HCL ER (XL) 150 MG PO TB24: 150.0000 mg | ORAL_TABLET | Freq: Every day | ORAL | 1 refills | Status: DC

## 2021-06-26 MED ORDER — SODIUM BICARBONATE 325 MG PO TABS: 325.0000 mg | ORAL_TABLET | Freq: Three times a day (TID) | ORAL | 6 refills | Status: DC | PRN

## 2021-06-26 NOTE — Assessment & Plan Note (Signed)
Improving with heat pad and can continue.

## 2021-06-26 NOTE — Assessment & Plan Note (Signed)
Wishes rx for sodium bicarbonate to use prn for acid and this is done today. Previously effective.

## 2021-06-26 NOTE — Assessment & Plan Note (Signed)
Has been off celexa due to being between providers but did not feel a lot of benefit from it and we decided to try wellbutrin instead to see if she would benefit from it regarding appetite as well.

## 2021-06-26 NOTE — Assessment & Plan Note (Signed)
No recent episode and taking metoprolol 50 mg daily for this. Continue current dosing.

## 2021-06-26 NOTE — Assessment & Plan Note (Addendum)
She was off celexa due to not having this and did not feel it was effective. Will try wellbutrin 150 mg daily to see if this can help with weight as well. Check in 1-2 months and can adjust dosing then or sooner if significant side effect. She does have ongoing social stress which limits effectiveness.

## 2021-06-26 NOTE — Assessment & Plan Note (Signed)
Complicated by depression/anxiety and GERD. We talked about saxenda and wegovy as options for weight management and she will check n coverage.

## 2021-06-26 NOTE — Patient Instructions (Addendum)
We have sent in the sodium bicarbonate to use for your stomach.  We have sent in wellbutrin to replace the celexa and it may help with weight as well.  For the weight we could try saxenda or wegovy which are injections which help.

## 2021-06-26 NOTE — Assessment & Plan Note (Signed)
S/P surgery in 2011 and still with intermittent symptoms regarding this.

## 2021-06-26 NOTE — Assessment & Plan Note (Signed)
Previously on high dose 50000 units weekly vitamin D and now on otc replacement.

## 2021-06-26 NOTE — Progress Notes (Signed)
   Subjective:   Patient ID: Virginia Woods, female    DOB: 12/07/1970, 50 y.o.   MRN: [id number]  HPI The patient is a new 50 YO female coming in for ongoing care and right shoulder pain (overall resolving). Going through chronic emotional stress which is affecting her physical health and weight.   PMH, [facility], social history reviewed and updated  Review of Systems  Constitutional:  Positive for unexpected weight change.  HENT: Negative.    Eyes: Negative.   Respiratory:  Negative for cough, chest tightness and shortness of breath.   Cardiovascular:  Negative for chest pain, palpitations and leg swelling.  Gastrointestinal:  Negative for abdominal distention, abdominal pain, constipation, diarrhea, nausea and vomiting.  Musculoskeletal:  Positive for arthralgias and myalgias.  Skin: Negative.   Neurological: Negative.   Psychiatric/Behavioral: Negative.     Objective:  Physical Exam Constitutional:      Appearance: She is well-developed.  HENT:     Head: Normocephalic and atraumatic.  Cardiovascular:     Rate and Rhythm: Normal rate and regular rhythm.  Pulmonary:     Effort: Pulmonary effort is normal. No respiratory distress.     Breath sounds: Normal breath sounds. No wheezing or rales.  Abdominal:     General: Bowel sounds are normal. There is no distension.     Palpations: Abdomen is soft.     Tenderness: There is no abdominal tenderness. There is no rebound.  Musculoskeletal:     Cervical back: Normal range of motion.  Skin:    General: Skin is warm and dry.  Neurological:     Mental Status: She is alert and oriented to person, place, and time.     Coordination: Coordination normal.    Vitals:   06/26/21 0922  BP: 126/80  Pulse: 79  Resp: 18  SpO2: 97%  Weight: 219 lb 9.6 oz (99.6 kg)  Height: 5\' 5"  (1.651 m)    This visit occurred during the SARS-CoV-2 public health emergency.  Safety protocols were in place, including screening questions prior to the  visit, additional usage of staff PPE, and extensive cleaning of exam room while observing appropriate contact time as indicated for disinfecting solutions.   Assessment & Plan:

## 2021-06-26 NOTE — Assessment & Plan Note (Signed)
Taking vitamin B12 otc.

## 2021-07-11 ENCOUNTER — Other Ambulatory Visit: Payer: Self-pay

## 2021-07-11 ENCOUNTER — Encounter (HOSPITAL_COMMUNITY): Payer: Self-pay

## 2021-07-11 ENCOUNTER — Emergency Department (HOSPITAL_COMMUNITY)
Admission: EM | Admit: 2021-07-11 | Discharge: 2021-07-12 | Disposition: A | Discharge status: Home / Self Care | Payer: Federal, State, Local not specified - PPO | Attending: Emergency Medicine | Admitting: Emergency Medicine

## 2021-07-11 DIAGNOSIS — X58XXXA Exposure to other specified factors, initial encounter: Secondary | ICD-10-CM | POA: Diagnosis not present

## 2021-07-11 DIAGNOSIS — T192XXA Foreign body in vulva and vagina, initial encounter: Secondary | ICD-10-CM | POA: Diagnosis not present

## 2021-07-11 DIAGNOSIS — S30814A Abrasion of vagina and vulva, initial encounter: Secondary | ICD-10-CM | POA: Diagnosis not present

## 2021-07-11 NOTE — Discharge Instructions (Signed)
May have a small amount of spotting from the vaginal abrasion with wiping. I would avoid sexual activity for about 48 hours to let this heal. Can follow-up with your OB-GYN if any ongoing issues. Return here for new concerns.

## 2021-07-11 NOTE — ED Provider Notes (Signed)
[facility] EMERGENCY DEPARTMENT Provider Note   CSN: [id number] Arrival date & time: 07/11/21  2031     History Chief Complaint  Patient presents with   Foreign Body in [place] is a 50 y.o. female.  The history is provided by the patient and medical records.  Foreign Body in Vagina  50 y.o. F with hx of anemia, depression, headaches, presenting to the ED for foreign body in vagina.  States 2 weeks ago was on menstrual cycle, has been a little heavier than normal so has been wearing tampons instead of pads.  States the last time she used one was 2 weeks ago.  States tonight she was using the bathroom and felt something inside but unable to retrieve it. She tried to dig it out and saw blood so assumes it was a tampon she left in there.  Denies foul odor or other discharge.  No concern for STD.  She is followed by GYN-- [facility].  Past Medical History:  Diagnosis Date   Anemia    Anxiety    Complication of anesthesia    pt reports some complication after 2nd brain surgery, but unsure of what it was.   Dental bridge present    Perrmanent dental retainer - bottom   Depression    Difficult intubation    with 2nd chiari surgery (per pt)   Family history of adverse reaction to anesthesia    reports mother had a "negative reaction" to "anesthesia or lidocaine"   Headache    History of Chiari malformation 2011   2 surgeries 2011 - [facility]   Vitamin B12 deficiency    Vitamin D deficiency    Wears contact lenses     Patient Active Problem List   Diagnosis Date Noted   Right shoulder pain 06/26/2021   Morbid obesity ([place]) 06/26/2021   Moderate episode of recurrent major depressive disorder ([place]) 03/30/2020   GERD (gastroesophageal reflux disease)    SVT (supraventricular tachycardia) (H[facility]) 04/10/2019   Mobitz type 2 second degree heart block 03/19/2019   Cervical radiculopathy 06/28/2018   Herpes simplex vulvovaginitis  03/17/2017   Allergic rhinitis 05/20/2015   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   Chronic pain 05/20/2015   Dermatitis, eczematoid 05/20/2015   Apnea, sleep 05/20/2015   B12 deficiency 05/20/2015   Avitaminosis D 05/20/2015   Hypo-osmolality and hyponatremia 10/13/2009   Anemia, iron deficiency 05/16/2009   Anxiety 11/05/2004    Past Surgical History:  Procedure Laterality Date   BRAIN SURGERY  2011   2 surgeries for chiari malformation - [place]   COLONOSCOPY  2014   COLONOSCOPY WITH PROPOFOL N/A 06/20/2019   Procedure: COLONOSCOPY WITH PROPOFOL;  Surgeon: Lin Landsman, MD;  Location: [place];  Service: Endoscopy;  Laterality: N/A;   ESOPHAGOGASTRODUODENOSCOPY (EGD) WITH PROPOFOL N/A 06/20/2019   Procedure: ESOPHAGOGASTRODUODENOSCOPY (EGD) WITH PROPOFOL;  Surgeon: Lin Landsman, MD;  Location: [place];  Service: Endoscopy;  Laterality: N/A;   TUBAL LIGATION  2002     OB History   No obstetric history on file.     Family History  Problem Relation Age of Onset   Hypertension Mother    Cancer Mother        Laryngeal and lymphoma   Hypertension Sister    Heart disease Maternal Grandmother     Social History   Tobacco Use   Smoking status: Never   Smokeless  tobacco: Never  Vaping Use   Vaping Use: Never used  Substance Use Topics   Alcohol use: Yes    Comment: 1-2 glasses wine per month   Drug use: No    Home Medications Prior to Admission medications   Medication Sig Start Date End Date Taking? Authorizing Provider  albuterol (VENTOLIN HFA) 108 (90 Base) MCG/ACT inhaler Inhale 2 puffs into the lungs every 6 (six) hours as needed for wheezing or shortness of breath. 03/26/20   Mar Daring, PA-C  ALPRAZolam (XANAX) 0.5 MG tablet TAKE 1 TABLET (0.5 MG TOTAL) BY MOUTH 2 (TWO) TIMES DAILY AS NEEDED. 11/25/20   Flinchum, Kelby Aline, FNP  Ascorbic Acid (VITAMIN C PO) Take by mouth.    [provider]  buPROPion (WELLBUTRIN XL) 150 MG 24 hr tablet Take 1 tablet (150 mg total) by mouth daily. 06/26/21   Hoyt Koch, MD  Cyanocobalamin (VITAMIN B 12 PO) Take by mouth.    [provider]  cyclobenzaprine (FLEXERIL) 5 MG tablet Take 1 tablet (5 mg total) by mouth 3 (three) times daily as needed for muscle spasms. 03/26/20   Mar Daring, PA-C  diphenhydrAMINE (BENADRYL) 25 MG tablet Take 25 mg by mouth at bedtime as needed for sleep.    [provider]  gabapentin (NEURONTIN) 300 MG capsule Take 1 capsule (300 mg total) by mouth 2 (two) times daily. 03/26/20   Mar Daring, PA-C  levocetirizine (XYZAL) 5 MG tablet Take 1 tablet (5 mg total) by mouth every evening. 01/05/21   Flinchum, Kelby Aline, FNP  lidocaine (LIDODERM) 5 % Place 1 patch onto the skin daily. Remove & Discard patch within 12 hours or as directed by MD 04/25/20   Mar Daring, PA-C  loratadine (CLARITIN) 10 MG tablet Take 10 mg by mouth daily as needed.     [provider]  metoprolol succinate (TOPROL-XL) 25 MG 24 hr tablet Take 1 tablet (25 mg total) by mouth daily. 03/26/20   Mar Daring, PA-C  nystatin (MYCOSTATIN/NYSTOP) powder Apply 1 application topically 3 (three) times daily. 08/20/20   Mar Daring, PA-C  OMEGA-3 FATTY ACIDS PO  05/01/09   [provider]  omeprazole (PRILOSEC) 40 MG capsule TAKE 1 CAPSULE (40 MG TOTAL) BY MOUTH 2 (TWO) TIMES DAILY BEFORE A MEAL. 06/01/20 07/01/20  Mar Daring, PA-C  propranolol (INDERAL) 10 MG tablet TAKE 1 TABLET (10 MG TOTAL) BY MOUTH 4 (FOUR) TIMES DAILY AS NEEDED (FAST HEART RATE/PALPITATIONS). Please make overdue appt with Dr. Acie Fredrickson. 3rd and Final Attempt 11/25/20   Nahser, Wonda Cheng, MD  sodium bicarbonate 325 MG tablet Take 1 tablet (325 mg total) by mouth 3 (three) times daily as needed for heartburn. 06/26/21   Hoyt Koch, MD  triamcinolone (KENALOG) 0.1 % Apply 1 application  topically 2 (two) times daily. 08/20/20   Mar Daring, PA-C  Vitamin D, Ergocalciferol, (DRISDOL) 1.25 MG (50000 UNIT) CAPS capsule TAKE ONE CAPSULE BY MOUTH EVERY 7 DAYS 11/29/20   Flinchum, Kelby Aline, FNP  zolpidem (AMBIEN) 5 MG tablet Take 0.5-1 tablets (2.5-5 mg total) by mouth at bedtime as needed for sleep (do not take within 6 hours of xanax.). 11/25/20   Flinchum, Kelby Aline, FNP    Allergies    Molds & smuts, Soy allergy, Doxycycline, and Tamiflu [oseltamivir phosphate]  Review of Systems   Review of Systems  Genitourinary:  Positive for vaginal bleeding.  All other systems reviewed and are  negative.  Physical Exam Updated Vital Signs BP (!) 155/89 (BP Location: Right Arm)   Pulse (!) 54   Temp 98.8 F (37.1 C) (Oral)   Resp 18   Ht 5\' 5"  (1.651 m)   Wt 95.3 kg   LMP 06/22/2021 (Exact Date)   SpO2 99%   BMI 34.95 kg/m   Physical Exam Vitals and nursing note reviewed.  Constitutional:      Appearance: She is well-developed.  HENT:     Head: Normocephalic and atraumatic.  Eyes:     Conjunctiva/sclera: Conjunctivae normal.     Pupils: Pupils are equal, round, and reactive to light.  Cardiovascular:     Rate and Rhythm: Normal rate and regular rhythm.     Heart sounds: Normal heart sounds.  Pulmonary:     Effort: Pulmonary effort is normal. No respiratory distress.     Breath sounds: Normal breath sounds. No rhonchi.  Abdominal:     General: Bowel sounds are normal.     Palpations: Abdomen is soft.     Tenderness: There is no abdominal tenderness. There is no rebound.  Genitourinary:    Comments: Exam chaperoned by NT There is some abrasion noted along anterior vaginal wall, no retained FB, no active bleeding, scant amount of white/clear vaginal discharge Musculoskeletal:        General: Normal range of motion.     Cervical back: Normal range of motion.  Skin:    General: Skin is warm and dry.  Neurological:     Mental Status: She is alert and  oriented to person, place, and time.    ED Results / Procedures / Treatments   Labs (all labs ordered are listed, but only abnormal results are displayed) Labs Reviewed - No data to display  EKG None  Radiology No results found.  Procedures Procedures   Medications Ordered in ED Medications - No data to display  ED Course  I have reviewed the triage vital signs and the nursing notes.  Pertinent labs & imaging results that were available during my care of the patient were reviewed by me and considered in my medical decision making (see chart for details).    MDM Rules/Calculators/A&P                           50 year old female presenting to the ED with concern of vaginal FB.  States heavy menses about 2 weeks ago, notes using tampons instead of pads.  Had pressure sensation tonight and is concerned for retained tampon as while trying to retrieve it she noticed blood on her hand.  Denies abdominal pain, discharge, or odor.  Pelvic exam performed, no retained FB noted.  Does appear to have abrasion along anterior wall of vaginal canal, likely from patient trying to retrieve FB.  There is no active bleeding at present but I suspect this may be source of her prior bleeding.  Patient reassured.  Can follow-up with GYN if any ongoing issues.  Return here for new concerns.  Final Clinical Impression(s) / ED Diagnoses Final diagnoses:  Abrasion of vagina, initial encounter    Rx / DC Orders ED Discharge Orders     None        Larene Pickett, PA-C 07/11/21 [id number]    Drenda Freeze, MD 07/12/21 1459

## 2021-07-11 NOTE — ED Triage Notes (Signed)
Pt reports accidentally left a tampon inside her vagina 2 weeks ago. She states she can feel it but is unable to get it out and noticed blood on her hand.

## 2021-07-13 DIAGNOSIS — Z20822 Contact with and (suspected) exposure to covid-19: Secondary | ICD-10-CM | POA: Diagnosis not present

## 2021-07-14 ENCOUNTER — Telehealth (INDEPENDENT_AMBULATORY_CARE_PROVIDER_SITE_OTHER): Payer: Federal, State, Local not specified - PPO | Admitting: Family Medicine

## 2021-07-14 ENCOUNTER — Other Ambulatory Visit: Payer: Self-pay

## 2021-07-14 DIAGNOSIS — J069 Acute upper respiratory infection, unspecified: Secondary | ICD-10-CM

## 2021-07-14 NOTE — Progress Notes (Signed)
Patient ID: Virginia Woods, female   DOB: 1971/03/01, 50 y.o.   MRN: [id number]   This visit type was conducted due to national recommendations for restrictions regarding the COVID-19 pandemic in an effort to limit this patient's exposure and mitigate transmission in our community.   Virtual Visit via Video Note  I connected with Virginia Woods on 07/14/21 at 10:00 AM EDT by a video enabled telemedicine application and verified that I am speaking with the correct person using two identifiers.  Location patient: home Location provider:work or home office Persons participating in the virtual visit: patient, provider  I discussed the limitations of evaluation and management by telemedicine and the availability of in person appointments. The patient expressed understanding and agreed to proceed.   HPI:  Virginia Woods has upper respiratory symptoms.  Onset Saturday of some cough, sore throat, nasal congestion.  Low-grade fever.  She did home rapid COVID test which came back negative.  She had COVID PCR at local drugstore yesterday which is still pending.  She had COVID infection back in June and states that her symptoms now are not as severe.  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  She is taken some Benadryl and Allegra but feels like she has had some excessive drying in her throat and increased fatigue which she thinks may be related to the Benadryl.  Nasal congestion not as severe today.  No dyspnea.   ROS: See pertinent positives and negatives per HPI.  Past Medical History:  Diagnosis Date   Anemia    Anxiety    Complication of anesthesia    pt reports some complication after 2nd brain surgery, but unsure of what it was.   Dental bridge present    Perrmanent dental retainer - bottom   Depression    Difficult intubation    with 2nd chiari surgery (per pt)   Family history of adverse reaction to anesthesia    reports mother had a "negative  reaction" to "anesthesia or lidocaine"   Headache    History of Chiari malformation 2011   2 surgeries 2011 - [facility]   Vitamin B12 deficiency    Vitamin D deficiency    Wears contact lenses     Past Surgical History:  Procedure Laterality Date   BRAIN SURGERY  2011   2 surgeries for chiari malformation - [place]   COLONOSCOPY  2014   COLONOSCOPY WITH PROPOFOL N/A 06/20/2019   Procedure: COLONOSCOPY WITH PROPOFOL;  Surgeon: Lin Landsman, MD;  Location: [place];  Service: Endoscopy;  Laterality: N/A;   ESOPHAGOGASTRODUODENOSCOPY (EGD) WITH PROPOFOL N/A 06/20/2019   Procedure: ESOPHAGOGASTRODUODENOSCOPY (EGD) WITH PROPOFOL;  Surgeon: Lin Landsman, MD;  Location: [place];  Service: Endoscopy;  Laterality: N/A;   TUBAL LIGATION  2002    Family History  Problem Relation Age of Onset   Hypertension Mother    Cancer Mother        Laryngeal and lymphoma   Hypertension Sister    Heart disease Maternal Grandmother     SOCIAL HX: Non-smoker   Current Outpatient Medications:    albuterol (VENTOLIN HFA) 108 (90 Base) MCG/ACT inhaler, Inhale 2 puffs into the lungs every 6 (six) hours as needed for wheezing or shortness of breath., Disp: 18 g, Rfl: 3   ALPRAZolam (XANAX) 0.5 MG tablet, TAKE 1 TABLET (0.5 MG TOTAL) BY MOUTH 2 (TWO) TIMES DAILY AS NEEDED., Disp:  180 tablet, Rfl: 0   Ascorbic Acid (VITAMIN C PO), Take by mouth., Disp: , Rfl:    buPROPion (WELLBUTRIN XL) 150 MG 24 hr tablet, Take 1 tablet (150 mg total) by mouth daily., Disp: 90 tablet, Rfl: 1   Cyanocobalamin (VITAMIN B 12 PO), Take by mouth., Disp: , Rfl:    cyclobenzaprine (FLEXERIL) 5 MG tablet, Take 1 tablet (5 mg total) by mouth 3 (three) times daily as needed for muscle spasms., Disp: 30 tablet, Rfl: 5   diphenhydrAMINE (BENADRYL) 25 MG tablet, Take 25 mg by mouth at bedtime as needed for sleep., Disp: , Rfl:    gabapentin (NEURONTIN) 300 MG capsule, Take 1 capsule  (300 mg total) by mouth 2 (two) times daily., Disp: 180 capsule, Rfl: 1   levocetirizine (XYZAL) 5 MG tablet, Take 1 tablet (5 mg total) by mouth every evening., Disp: 90 tablet, Rfl: 0   lidocaine (LIDODERM) 5 %, Place 1 patch onto the skin daily. Remove & Discard patch within 12 hours or as directed by MD, Disp: 30 patch, Rfl: 0   loratadine (CLARITIN) 10 MG tablet, Take 10 mg by mouth daily as needed. , Disp: , Rfl:    metoprolol succinate (TOPROL-XL) 25 MG 24 hr tablet, Take 1 tablet (25 mg total) by mouth daily., Disp: 90 tablet, Rfl: 3   nystatin (MYCOSTATIN/NYSTOP) powder, Apply 1 application topically 3 (three) times daily., Disp: 15 g, Rfl: 0   OMEGA-3 FATTY ACIDS PO, , Disp: , Rfl:    propranolol (INDERAL) 10 MG tablet, TAKE 1 TABLET (10 MG TOTAL) BY MOUTH 4 (FOUR) TIMES DAILY AS NEEDED (FAST HEART RATE/PALPITATIONS). Please make overdue appt with Dr. Acie Fredrickson. 3rd and Final Attempt, Disp: 30 tablet, Rfl: 0   sodium bicarbonate 325 MG tablet, Take 1 tablet (325 mg total) by mouth 3 (three) times daily as needed for heartburn., Disp: 90 tablet, Rfl: 6   triamcinolone (KENALOG) 0.1 %, Apply 1 application topically 2 (two) times daily., Disp: 30 g, Rfl: 0   Vitamin D, Ergocalciferol, (DRISDOL) 1.25 MG (50000 UNIT) CAPS capsule, TAKE ONE CAPSULE BY MOUTH EVERY 7 DAYS, Disp: 12 capsule, Rfl: 1   zolpidem (AMBIEN) 5 MG tablet, Take 0.5-1 tablets (2.5-5 mg total) by mouth at bedtime as needed for sleep (do not take within 6 hours of xanax.)., Disp: 30 tablet, Rfl: 0   omeprazole (PRILOSEC) 40 MG capsule, TAKE 1 CAPSULE (40 MG TOTAL) BY MOUTH 2 (TWO) TIMES DAILY BEFORE A MEAL., Disp: 180 capsule, Rfl: 1  EXAM:  VITALS per patient if applicable:  GENERAL: alert, oriented, appears well and in no acute distress  HEENT: atraumatic, conjunttiva clear, no obvious abnormalities on inspection of external nose and ears  NECK: normal movements of the head and neck  LUNGS: on inspection no signs of  respiratory distress, breathing rate appears normal, no obvious gross SOB, gasping or wheezing  CV: no obvious cyanosis  MS: moves all visible extremities without noticeable abnormality  PSYCH/NEURO: pleasant and cooperative, no obvious depression or anxiety, speech and thought processing grossly intact  ASSESSMENT AND PLAN:  Discussed the following assessment and plan:  Viral URI with cough.  Rapid COVID test negative.  PCR test pending.  -Patient will get back with [place] when she gets results of PCR -Plenty of fluids and rest and treat symptomatically at this time.  Suggest that she leave off the Benadryl as this does not seem to be helping her nasal congestion much and she is having increased fatigue and  sleepiness with that. -Follow-up promptly for any increased fever, dyspnea, or other concerns.     I discussed the assessment and treatment plan with the patient. The patient was provided an opportunity to ask questions and all were answered. The patient agreed with the plan and demonstrated an understanding of the instructions.   The patient was advised to call back or seek an in-person evaluation if the symptoms worsen or if the condition fails to improve as anticipated.     Carolann Littler, MD

## 2021-07-15 ENCOUNTER — Encounter: Payer: Self-pay | Admitting: Family Medicine

## 2021-07-17 ENCOUNTER — Other Ambulatory Visit: Payer: Self-pay | Admitting: Internal Medicine

## 2021-07-17 ENCOUNTER — Other Ambulatory Visit: Payer: Self-pay | Admitting: Physician Assistant

## 2021-07-17 DIAGNOSIS — M5413 Radiculopathy, cervicothoracic region: Secondary | ICD-10-CM

## 2021-07-17 DIAGNOSIS — E559 Vitamin D deficiency, unspecified: Secondary | ICD-10-CM

## 2021-07-17 DIAGNOSIS — F419 Anxiety disorder, unspecified: Secondary | ICD-10-CM

## 2021-07-17 DIAGNOSIS — Q0700 Arnold-Chiari syndrome without spina bifida or hydrocephalus: Secondary | ICD-10-CM

## 2021-07-20 ENCOUNTER — Encounter: Payer: Self-pay | Admitting: Family Medicine

## 2021-07-20 ENCOUNTER — Encounter: Payer: Self-pay | Admitting: Internal Medicine

## 2021-07-20 NOTE — Telephone Encounter (Signed)
Please advise 

## 2021-07-20 NOTE — Telephone Encounter (Signed)
Can you call and clarify how often she is taking [place] and xanax? I'm not sure we really talked about those at our visit and I typically do not prescribe both due to interactions and safety issues. And usually we do not keep people on the high dose vitamin D long term so she may not need refill on that.

## 2021-07-21 ENCOUNTER — Other Ambulatory Visit: Payer: Self-pay | Admitting: Internal Medicine

## 2021-07-21 ENCOUNTER — Other Ambulatory Visit: Payer: Self-pay

## 2021-07-21 ENCOUNTER — Ambulatory Visit
Admission: EM | Admit: 2021-07-21 | Discharge: 2021-07-21 | Disposition: A | Discharge status: Home / Self Care | Payer: Federal, State, Local not specified - PPO

## 2021-07-21 DIAGNOSIS — J209 Acute bronchitis, unspecified: Secondary | ICD-10-CM

## 2021-07-21 DIAGNOSIS — M5413 Radiculopathy, cervicothoracic region: Secondary | ICD-10-CM

## 2021-07-21 DIAGNOSIS — Q0700 Arnold-Chiari syndrome without spina bifida or hydrocephalus: Secondary | ICD-10-CM

## 2021-07-21 DIAGNOSIS — J0100 Acute maxillary sinusitis, unspecified: Secondary | ICD-10-CM

## 2021-07-21 MED ORDER — BENZONATATE 100 MG PO CAPS: 100.0000 mg | ORAL_CAPSULE | Freq: Three times a day (TID) | ORAL | 0 refills | Status: DC | PRN

## 2021-07-21 MED ORDER — AMOXICILLIN 875 MG PO TABS: 875.0000 mg | ORAL_TABLET | Freq: Two times a day (BID) | ORAL | 0 refills | Status: AC

## 2021-07-21 NOTE — ED Triage Notes (Signed)
Pt presents with cough, congestion, sinus pressure x 1 week.  Coughing up yellowish mucous with some blood in it.  Temp in 99s.  Cough is keeping her up at night and is having tightness in chest.  Tylenol and Mucinex at home.

## 2021-07-21 NOTE — ED Provider Notes (Signed)
Roderic Palau    CSN: [id number] Arrival date & time: 07/21/21  1348      History   Chief Complaint Chief Complaint  Patient presents with   Cough   Nasal Congestion    HPI Virginia Woods is a 50 y.o. female.  Patient presents with >1 week history of sinus pressure, congestion, and cough.  Her cough is nonproductive but she has had yellow mucus from her nose when she blows it.  She states her cough is worse at night and is keeping her awake.  No fever, rash, shortness of breath, or other symptoms.  She had 2 negative COVID and flu tests done last week at [facility].  She also had an E-visit on 07/14/2021 and diagnosed with viral URI with cough; symptomatic treatment.  Patient's medical history includes second-degree heart block, SVT, Chiari malformation, morbid obesity, anemia, chronic pain.  The history is provided by the patient and medical records.   Past Medical History:  Diagnosis Date   Anemia    Anxiety    Complication of anesthesia    pt reports some complication after 2nd brain surgery, but unsure of what it was.   Dental bridge present    Perrmanent dental retainer - bottom   Depression    Difficult intubation    with 2nd chiari surgery (per pt)   Family history of adverse reaction to anesthesia    reports mother had a "negative reaction" to "anesthesia or lidocaine"   Headache    History of Chiari malformation 2011   2 surgeries 2011 - [facility]   Vitamin B12 deficiency    Vitamin D deficiency    Wears contact lenses     Patient Active Problem List   Diagnosis Date Noted   Right shoulder pain 06/26/2021   Morbid obesity ([place]) 06/26/2021   Moderate episode of recurrent major depressive disorder ([place]) 03/30/2020   GERD (gastroesophageal reflux disease)    SVT (supraventricular tachycardia) (HCC) 04/10/2019   Mobitz type 2 second degree heart block 03/19/2019   Cervical radiculopathy 06/28/2018   Herpes simplex vulvovaginitis 03/17/2017   Allergic  rhinitis 05/20/2015   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/2015   Chronic pain 05/20/2015   Dermatitis, eczematoid 05/20/2015   Apnea, sleep 05/20/2015   B12 deficiency 05/20/2015   Avitaminosis D 05/20/2015   Hypo-osmolality and hyponatremia 10/13/2009   Anemia, iron deficiency 05/16/2009   Anxiety 11/05/2004    Past Surgical History:  Procedure Laterality Date   BRAIN SURGERY  2011   2 surgeries for chiari malformation - [place]   COLONOSCOPY  2014   COLONOSCOPY WITH PROPOFOL N/A 06/20/2019   Procedure: COLONOSCOPY WITH PROPOFOL;  Surgeon: Lin Landsman, MD;  Location: [place];  Service: Endoscopy;  Laterality: N/A;   ESOPHAGOGASTRODUODENOSCOPY (EGD) WITH PROPOFOL N/A 06/20/2019   Procedure: ESOPHAGOGASTRODUODENOSCOPY (EGD) WITH PROPOFOL;  Surgeon: Lin Landsman, MD;  Location: [facility];  Service: Endoscopy;  Laterality: N/A;   TUBAL LIGATION  2002    OB History   No obstetric history on file.      Home Medications    Prior to Admission medications   Medication Sig Start Date End Date Taking? Authorizing Provider  albuterol (VENTOLIN HFA) 108 (90 Base) MCG/ACT inhaler Inhale 2 puffs into the lungs every 6 (six) hours as needed for wheezing or shortness of breath. 03/26/20  Yes Burnette, Anderson Malta M, PA-C  ALPRAZolam Duanne Moron) 0.5 MG tablet TAKE 1 TABLET (0.5 MG TOTAL) BY  MOUTH 2 (TWO) TIMES DAILY AS NEEDED. 11/25/20  Yes Flinchum, Kelby Aline, FNP  amoxicillin (AMOXIL) 875 MG tablet Take 1 tablet (875 mg total) by mouth 2 (two) times daily for 7 days. 07/21/21 07/28/21 Yes Sharion Balloon, NP  Ascorbic Acid (VITAMIN C PO) Take by mouth.   Yes [provider]  benzonatate (TESSALON) 100 MG capsule Take 1 capsule (100 mg total) by mouth 3 (three) times daily as needed for cough. 07/21/21  Yes Sharion Balloon, NP  buPROPion (WELLBUTRIN XL) 150 MG 24 hr tablet Take 1 tablet (150 mg total) by mouth daily. 06/26/21  Yes Hoyt Koch, MD  Cyanocobalamin (VITAMIN B 12 PO) Take by mouth.   Yes [provider]  cyclobenzaprine (FLEXERIL) 5 MG tablet Take 1 tablet (5 mg total) by mouth 3 (three) times daily as needed for muscle spasms. 03/26/20  Yes Mar Daring, PA-C  diphenhydrAMINE (BENADRYL) 25 MG tablet Take 25 mg by mouth at bedtime as needed for sleep.   Yes [provider]  fexofenadine (ALLEGRA) 180 MG tablet Take 180 mg by mouth daily.   Yes [provider]  gabapentin (NEURONTIN) 300 MG capsule Take 1 capsule (300 mg total) by mouth 2 (two) times daily. 03/26/20  Yes Mar Daring, PA-C  lidocaine (LIDODERM) 5 % Place 1 patch onto the skin daily. Remove & Discard patch within 12 hours or as directed by MD 04/25/20  Yes Mar Daring, PA-C  metoprolol succinate (TOPROL-XL) 25 MG 24 hr tablet Take 1 tablet (25 mg total) by mouth daily. 03/26/20  Yes Mar Daring, PA-C  nystatin (MYCOSTATIN/NYSTOP) powder Apply 1 application topically 3 (three) times daily. 08/20/20  Yes Mar Daring, PA-C  OMEGA-3 FATTY ACIDS PO  05/01/09  Yes [provider]  omeprazole (PRILOSEC) 40 MG capsule TAKE 1 CAPSULE (40 MG TOTAL) BY MOUTH 2 (TWO) TIMES DAILY BEFORE A MEAL. 06/01/20 07/21/21 Yes Burnette, Clearnce Sorrel, PA-C  propranolol (INDERAL) 10 MG tablet TAKE 1 TABLET (10 MG TOTAL) BY MOUTH 4 (FOUR) TIMES DAILY AS NEEDED (FAST HEART RATE/PALPITATIONS). Please make overdue appt with Dr. Acie Fredrickson. 3rd and Final Attempt 11/25/20  Yes Nahser, Wonda Cheng, MD  sodium bicarbonate 325 MG tablet Take 1 tablet (325 mg total) by mouth 3 (three) times daily as needed for heartburn. 06/26/21  Yes Hoyt Koch, MD  triamcinolone (KENALOG) 0.1 % Apply 1 application topically 2 (two) times daily. 08/20/20  Yes Burnette, Clearnce Sorrel, PA-C  Vitamin D, Ergocalciferol, (DRISDOL) 1.25 MG (50000 UNIT) CAPS capsule TAKE ONE CAPSULE BY MOUTH EVERY 7 DAYS 11/29/20  Yes Flinchum, Kelby Aline, FNP   zolpidem (AMBIEN) 5 MG tablet Take 0.5-1 tablets (2.5-5 mg total) by mouth at bedtime as needed for sleep (do not take within 6 hours of xanax.). 11/25/20  Yes Flinchum, Kelby Aline, FNP  levocetirizine (XYZAL) 5 MG tablet Take 1 tablet (5 mg total) by mouth every evening. 01/05/21   Flinchum, Kelby Aline, FNP  loratadine (CLARITIN) 10 MG tablet Take 10 mg by mouth daily as needed.     [provider]    Family History Family History  Problem Relation Age of Onset   Hypertension Mother    Cancer Mother        Laryngeal and lymphoma   Hypertension Sister    Heart disease Maternal Grandmother     Social History Social History   Tobacco Use   Smoking status: Never   Smokeless tobacco: Never  Vaping Use   Vaping Use: Never used  Substance Use Topics   Alcohol use: Yes    Comment: 1-2 glasses wine per month   Drug use: No     Allergies   Molds & smuts, Soy allergy, Doxycycline, and Tamiflu [oseltamivir phosphate]   Review of Systems Review of Systems  Constitutional:  Negative for chills and fever.  HENT:  Positive for congestion and sinus pressure. Negative for ear pain and sore throat.   Respiratory:  Positive for cough. Negative for shortness of breath.   Cardiovascular:  Negative for chest pain and palpitations.  Skin:  Negative for color change and rash.  All other systems reviewed and are negative.   Physical Exam Triage Vital Signs ED Triage Vitals [07/21/21 1403]  Enc Vitals Group     BP 137/77     Pulse Rate 90     Resp 18     Temp 97.9 F (36.6 C)     Temp Source Oral     SpO2 98 %     Weight      Height      Head Circumference      Peak Flow      Pain Score      Pain Loc      Pain Edu?      Excl. in [place]?    No data found.  Updated Vital Signs BP 137/77 (BP Location: Left Arm)   Pulse 90   Temp 97.9 F (36.6 C) (Oral)   Resp 18   LMP 07/18/2021 (Exact Date)   SpO2 98%   Visual Acuity Right Eye Distance:   Left Eye Distance:    Bilateral Distance:    Right Eye Near:   Left Eye Near:    Bilateral Near:     Physical Exam Vitals and nursing note reviewed.  Constitutional:      General: She is not in acute distress.    Appearance: She is well-developed.  HENT:     Head: Normocephalic and atraumatic.     Right Ear: Tympanic membrane normal.     Left Ear: Tympanic membrane normal.     Nose: Congestion present.     Mouth/Throat:     Mouth: Mucous membranes are moist.     Pharynx: Oropharynx is clear.  Eyes:     Conjunctiva/sclera: Conjunctivae normal.  Cardiovascular:     Rate and Rhythm: Normal rate and regular rhythm.     Heart sounds: Normal heart sounds.  Pulmonary:     Effort: Pulmonary effort is normal. No respiratory distress.     Breath sounds: Normal breath sounds.     Comments: Nonproductive cough during exam. Abdominal:     Palpations: Abdomen is soft.     Tenderness: There is no abdominal tenderness.  Musculoskeletal:     Cervical back: Neck supple.  Skin:    General: Skin is warm and dry.  Neurological:     Mental Status: She is alert.  Psychiatric:        Mood and Affect: Mood normal.        Behavior: Behavior normal.     UC Treatments / Results  Labs (all labs ordered are listed, but only abnormal results are displayed) Labs Reviewed - No data to display  EKG   Radiology No results found.  Procedures Procedures (including critical care time)  Medications Ordered in UC Medications - No data to display  Initial Impression / Assessment and Plan / UC Course  I  have reviewed the triage vital signs and the nursing notes.  Pertinent labs & imaging results that were available during my care of the patient were reviewed by me and considered in my medical decision making (see chart for details).  Acute sinusitis and bronchitis.  Patient has been symptomatic for more than 1 week.  She has had 2 negative COVID and flu tests.  She hhas been treating her symptoms without relief.   Treating today with amoxicillin and Tessalon Perles.  Education provided on sinusitis and bronchitis.  Instructed patient to follow-up with her PCP if her symptoms are not improving.  She agrees to plan of care.   Final Clinical Impressions(s) / UC Diagnoses   Final diagnoses:  Acute non-recurrent maxillary sinusitis  Acute bronchitis, unspecified organism     Discharge Instructions      Take the amoxicillin and Tessalon Perles as directed.  Follow up with your primary care provider if your symptoms are not improving.        ED Prescriptions     Medication Sig Dispense Auth. Provider   amoxicillin (AMOXIL) 875 MG tablet Take 1 tablet (875 mg total) by mouth 2 (two) times daily for 7 days. 14 tablet Sharion Balloon, NP   benzonatate (TESSALON) 100 MG capsule Take 1 capsule (100 mg total) by mouth 3 (three) times daily as needed for cough. 21 capsule Sharion Balloon, NP      PDMP not reviewed this encounter.   Sharion Balloon, NP 07/21/21 [phone]

## 2021-07-21 NOTE — Discharge Instructions (Addendum)
Take the amoxicillin and Tessalon Perles as directed.    Follow up with your primary care provider if your symptoms are not improving.    

## 2021-07-23 ENCOUNTER — Ambulatory Visit: Payer: Federal, State, Local not specified - PPO | Admitting: Internal Medicine

## 2021-07-23 DIAGNOSIS — F411 Generalized anxiety disorder: Secondary | ICD-10-CM | POA: Diagnosis not present

## 2021-07-23 NOTE — Telephone Encounter (Signed)
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  **I asked her to ask for me upon returning my call as I have been calling her for 2 days with no success.

## 2021-07-23 NOTE — Telephone Encounter (Signed)
Can you address my question? Did you call and talk to patient about this for me?

## 2021-07-24 ENCOUNTER — Other Ambulatory Visit: Payer: Self-pay

## 2021-07-24 ENCOUNTER — Inpatient Hospital Stay: Payer: Federal, State, Local not specified - PPO | Attending: Hematology and Oncology

## 2021-07-24 DIAGNOSIS — D509 Iron deficiency anemia, unspecified: Secondary | ICD-10-CM | POA: Diagnosis not present

## 2021-07-24 DIAGNOSIS — D508 Other iron deficiency anemias: Secondary | ICD-10-CM

## 2021-07-24 LAB — CBC WITH DIFFERENTIAL (CANCER CENTER ONLY)
Abs Immature Granulocytes: 0.04 10*3/uL (ref 0.00–0.07)
Basophils Absolute: 0.1 10*3/uL (ref 0.0–0.1)
Basophils Relative: 1 %
Eosinophils Absolute: 0.1 10*3/uL (ref 0.0–0.5)
Eosinophils Relative: 2 %
HCT: 38.2 % (ref 36.0–46.0)
Hemoglobin: 12.1 g/dL (ref 12.0–15.0)
Immature Granulocytes: 1 %
Lymphocytes Relative: 35 %
Lymphs Abs: 2.1 10*3/uL (ref 0.7–4.0)
MCH: 23.4 pg — ABNORMAL LOW (ref 26.0–34.0)
MCHC: 31.7 g/dL (ref 30.0–36.0)
MCV: 74 fL — ABNORMAL LOW (ref 80.0–100.0)
Monocytes Absolute: 0.5 10*3/uL (ref 0.1–1.0)
Monocytes Relative: 8 %
Neutro Abs: 3.2 10*3/uL (ref 1.7–7.7)
Neutrophils Relative %: 53 %
Platelet Count: 365 10*3/uL (ref 150–400)
RBC: 5.16 MIL/uL — ABNORMAL HIGH (ref 3.87–5.11)
RDW: 14.3 % (ref 11.5–15.5)
WBC Count: 6 10*3/uL (ref 4.0–10.5)
nRBC: 0 % (ref 0.0–0.2)

## 2021-07-27 ENCOUNTER — Inpatient Hospital Stay: Payer: Federal, State, Local not specified - PPO | Admitting: Hematology and Oncology

## 2021-07-27 LAB — IRON AND TIBC
Iron: 66 ug/dL (ref 41–142)
Saturation Ratios: 16 % — ABNORMAL LOW (ref 21–57)
TIBC: 404 ug/dL (ref 236–444)
UIBC: 338 ug/dL (ref 120–384)

## 2021-07-27 LAB — FERRITIN: Ferritin: 26 ng/mL (ref 11–307)

## 2021-07-28 ENCOUNTER — Encounter: Payer: Self-pay | Admitting: Internal Medicine

## 2021-07-29 NOTE — Progress Notes (Signed)
  HEMATOLOGY-ONCOLOGY TELEPHONE VISIT PROGRESS NOTE  I connected with Virginia Woods on 07/30/2021 at  2:30 PM EST by telephone and verified that I am speaking with the correct person using two identifiers.  I discussed the limitations, risks, security and privacy concerns of performing an evaluation and management service by telephone and the availability of in person appointments.  I also discussed with the patient that there may be a patient responsible charge related to this service. The patient expressed understanding and agreed to proceed.   History of Present Illness: Virginia Woods is a 50 y.o. female with above-mentioned history of iron deficiency anemia. Labs on 07/24/2021 showed Hg 12.1, HCT 38.2, platelets 365, iron saturation 16%, ferritin 26. She presents via telephone today for follow-up.  Observations/Objective:    Assessment Plan:  Anemia, iron deficiency Lab review 2020: Iron saturation 7%, ferritin 13 2021: Iron saturation 7%, ferritin 23 01/05/2021: Iron saturation 8%, ferritin 13, TIBC 420, hemoglobin 11.5, MCV 73 07/24/2021: Hemoglobin 12.1, MCV 74 ferritin 26, iron saturation 16%   Iron deficiency with only mild anemia: Longstanding Differential diagnosis blood loss versus malabsorption (she had colonoscopy 2 years ago and that was negative for bleeding, she does have couple of days of heavy menstrual cycles every month.)  No role of IV iron at this time.  Return to clinic in 6 months with labs done ahead of time and follow-up with a virtual visit    I discussed the assessment and treatment plan with the patient. The patient was provided an opportunity to ask questions and all were answered. The patient agreed with the plan and demonstrated an understanding of the instructions. The patient was advised to call back or seek an in-person evaluation if the symptoms worsen or if the condition fails to improve as anticipated.   Total time spent: 11 mins  including non-face to face time and time spent for planning, charting and coordination of care  Rulon Eisenmenger, MD 07/30/2021    I, Thana Ates, am acting as scribe for Nicholas Lose, MD.  I have reviewed the above documentation for accuracy and completeness, and I agree with the above.

## 2021-07-30 ENCOUNTER — Inpatient Hospital Stay (HOSPITAL_BASED_OUTPATIENT_CLINIC_OR_DEPARTMENT_OTHER): Payer: Federal, State, Local not specified - PPO | Admitting: Hematology and Oncology

## 2021-07-30 ENCOUNTER — Encounter: Payer: Self-pay | Admitting: *Deleted

## 2021-07-30 DIAGNOSIS — D508 Other iron deficiency anemias: Secondary | ICD-10-CM | POA: Diagnosis not present

## 2021-07-30 NOTE — Assessment & Plan Note (Addendum)
Lab review 2020: Iron saturation 7%, ferritin 13 2021: Iron saturation 7%, ferritin 23 01/05/2021: Iron saturation 8%, ferritin 13, TIBC 420, hemoglobin 11.5, MCV 73 07/24/2021: Hemoglobin 12.1, MCV 74 ferritin 26, iron saturation 16%  Iron deficiency with only mild anemia: Longstanding Differential diagnosis blood loss versus malabsorption (she had colonoscopy 2 years ago and that was negative for bleeding, she does have couple of days of heavy menstrual cycles every month.)  No role of IV iron at this time.  Return to clinic in 6 months with labs done ahead of time and follow-up with a virtual visit

## 2021-08-24 DIAGNOSIS — M25561 Pain in right knee: Secondary | ICD-10-CM | POA: Diagnosis not present

## 2021-09-07 ENCOUNTER — Ambulatory Visit
Admission: RE | Admit: 2021-09-07 | Discharge: 2021-09-07 | Disposition: A | Discharge status: Home / Self Care | Payer: Federal, State, Local not specified - PPO | Source: Ambulatory Visit | Attending: Internal Medicine | Admitting: Internal Medicine

## 2021-09-07 ENCOUNTER — Other Ambulatory Visit: Payer: Self-pay

## 2021-09-07 DIAGNOSIS — Z1231 Encounter for screening mammogram for malignant neoplasm of breast: Secondary | ICD-10-CM

## 2021-09-08 ENCOUNTER — Telehealth: Payer: Self-pay

## 2021-09-08 NOTE — Telephone Encounter (Signed)
Pt has not called the office to answer any question from previous phone encounter. Therefore Dr. Sharlet Salina has denied her rx refills for now.

## 2021-09-08 NOTE — Telephone Encounter (Deleted)
,  [email or web address]

## 2021-09-22 ENCOUNTER — Ambulatory Visit: Payer: Federal, State, Local not specified - PPO | Admitting: Internal Medicine

## 2021-09-23 DIAGNOSIS — M25561 Pain in right knee: Secondary | ICD-10-CM | POA: Diagnosis not present

## 2021-09-24 DIAGNOSIS — S83281A Other tear of lateral meniscus, current injury, right knee, initial encounter: Secondary | ICD-10-CM | POA: Diagnosis not present

## 2021-09-25 DIAGNOSIS — M25561 Pain in right knee: Secondary | ICD-10-CM | POA: Diagnosis not present

## 2021-09-25 DIAGNOSIS — M2391 Unspecified internal derangement of right knee: Secondary | ICD-10-CM | POA: Diagnosis not present

## 2021-09-30 ENCOUNTER — Ambulatory Visit: Payer: Federal, State, Local not specified - PPO | Admitting: Dermatology

## 2021-09-30 ENCOUNTER — Other Ambulatory Visit: Payer: Self-pay

## 2021-09-30 DIAGNOSIS — T300 Burn of unspecified body region, unspecified degree: Secondary | ICD-10-CM

## 2021-09-30 DIAGNOSIS — L821 Other seborrheic keratosis: Secondary | ICD-10-CM | POA: Diagnosis not present

## 2021-09-30 DIAGNOSIS — L818 Other specified disorders of pigmentation: Secondary | ICD-10-CM | POA: Diagnosis not present

## 2021-09-30 DIAGNOSIS — T24021A Burn of unspecified degree of right knee, initial encounter: Secondary | ICD-10-CM | POA: Diagnosis not present

## 2021-09-30 DIAGNOSIS — L811 Chloasma: Secondary | ICD-10-CM

## 2021-09-30 DIAGNOSIS — L819 Disorder of pigmentation, unspecified: Secondary | ICD-10-CM

## 2021-09-30 MED ORDER — MOMETASONE FUROATE 0.1 % EX CREA: TOPICAL_CREAM | CUTANEOUS | 1 refills | Status: DC

## 2021-09-30 NOTE — Patient Instructions (Addendum)
Instructions for [facility] Medications  One or more of your medications was sent to the [facility] mail order compounding pharmacy. You will receive an email from them and can purchase the medicine through that link. It will then be mailed to your home at the address you confirmed. If for any reason you do not receive an email from them, please check your spam folder. If you still do not find the email, please let us know. [facility] phone number is [phone].    If You Need Anything After Your Visit  If you have any questions or concerns for your doctor, please call our main line at [phone] and press option 4 to reach your doctor's medical assistant. If no one answers, please leave a voicemail as directed and we will return your call as soon as possible. Messages left after 4 pm will be answered the following business day.   You may also send [place] a message via [place]. We typically respond to MyChart messages within 1-2 business days.  For prescription refills, please ask your pharmacy to contact our office. Our fax number is [phone].  If you have an urgent issue when the clinic is closed that cannot wait until the next business day, you can page your doctor at the number below.    Please note that while we do our best to be available for urgent issues outside of office hours, we are not available 24/7.   If you have an urgent issue and are unable to reach [place], you may choose to seek medical care at your doctor's office, retail clinic, urgent care center, or emergency room.  If you have a medical emergency, please immediately call 911 or go to the emergency department.  Pager Numbers  - Dr. Nehemiah Massed: [phone]  - Dr. Laurence Ferrari: [phone]  - Dr. Nicole Kindred: [phone]  In the event of inclement weather, please call our main line at [phone] for an update on the status of any delays or closures.  Dermatology Medication Tips: Please keep the boxes that  topical medications come in in order to help keep track of the instructions about where and how to use these. Pharmacies typically print the medication instructions only on the boxes and not directly on the medication tubes.   If your medication is too expensive, please contact our office at [phone] option 4 or send [place] a message through [place].   We are unable to tell what your co-pay for medications will be in advance as this is different depending on your insurance coverage. However, we may be able to find a substitute medication at lower cost or fill out paperwork to get insurance to cover a needed medication.   If a prior authorization is required to get your medication covered by your insurance company, please allow [place] 1-2 business days to complete this process.  Drug prices often vary depending on where the prescription is filled and some pharmacies may offer cheaper prices.  The website www.goodrx.com contains coupons for medications through different pharmacies. The prices here do not account for what the cost may be with help from insurance (it may be cheaper with your insurance), but the website can give you the price if you did not use any insurance.  - You can print the associated coupon and take it with your prescription to the pharmacy.  - You may also stop by our office during regular business hours and pick up a GoodRx coupon card.  - If you need your prescription  sent electronically to a different pharmacy, notify our office through [facility] or by phone at [phone] option 4.     Si Usted Necesita Algo Despus de Su Visita  Tambin puede enviarnos un mensaje a travs de Pharmacist, community. Por lo general respondemos a los mensajes de MyChart en el transcurso de 1 a 2 das hbiles.  Para renovar recetas, por favor pida a su farmacia que se ponga en contacto con nuestra oficina. Harland Dingwall de fax es [place] [phone].  Si tiene un asunto urgente cuando la clnica  est cerrada y que no puede esperar hasta el siguiente da hbil, puede llamar/localizar a su doctor(a) al nmero que aparece a continuacin.   Por favor, tenga en cuenta que aunque hacemos todo lo posible para estar disponibles para asuntos urgentes fuera del horario de [place], no estamos disponibles las 24 horas del da, los 7 das de la [place].   Si tiene un problema urgente y no puede comunicarse con nosotros, puede optar por buscar atencin mdica  en el consultorio de su doctor(a), en una clnica privada, en un centro de atencin urgente o en una sala de emergencias.  Si tiene Engineering geologist, por favor llame inmediatamente al 911 o vaya a la sala de emergencias.  Nmeros de bper  - Dr. Nehemiah Massed: [phone]  - Dra. Moye: [phone]  - Dra. Nicole Kindred: [phone]  En caso de inclemencias del [place], por favor llame a Johnsie Kindred principal al [phone] para una actualizacin sobre el [place] de cualquier retraso o cierre.  Consejos para la medicacin en dermatologa: Por favor, guarde las cajas en las que vienen los medicamentos de uso tpico para ayudarle a seguir las instrucciones sobre dnde y cmo usarlos. Las farmacias generalmente imprimen las instrucciones del medicamento slo en las cajas y no directamente en los tubos del [place].   Si su medicamento es muy caro, por favor, pngase en contacto con Zigmund Daniel llamando al [phone] y presione la opcin 4 o envenos un mensaje a travs de Pharmacist, community.   No podemos decirle cul ser su copago por los medicamentos por adelantado ya que esto es diferente dependiendo de la cobertura de su seguro. Sin embargo, es posible que podamos encontrar un medicamento sustituto a Electrical engineer un formulario para que el seguro cubra el medicamento que se considera necesario.   Si se requiere una autorizacin previa para que su compaa de seguros [place] su medicamento, por favor permtanos de 1 a 2 das hbiles para  completar este proceso.  Los precios de los medicamentos varan con frecuencia dependiendo del Environmental consultant de dnde se surte la receta y alguna farmacias pueden ofrecer precios ms baratos.  El sitio web www.goodrx.com tiene cupones para medicamentos de Airline pilot. Los precios aqu no tienen en cuenta lo que podra costar con la ayuda del seguro (puede ser ms barato con su seguro), pero el sitio web puede darle el precio si no utiliz Research scientist (physical sciences).  - Puede imprimir el cupn correspondiente y llevarlo con su receta a la farmacia.  - Tambin puede pasar por nuestra oficina durante el horario de atencin regular y Charity fundraiser una tarjeta de cupones de GoodRx.  - Si necesita que su receta se enve electrnicamente a una farmacia diferente, informe a nuestra oficina a travs de MyChart de [place] o por telfono llamando al [phone] y presione la opcin 4.

## 2021-09-30 NOTE — Progress Notes (Signed)
° °  New Patient Visit  Subjective  Virginia Woods is a 51 y.o. female who presents for the following: Other (Pt would like to discuss dark spots on face. She also states that she burned her right knee with her heating pad x approx 1 wk ago and wants to discuss what to put on the area. ). She also has growths on her face she would like evaluated.  Objective  Well appearing patient in no apparent distress; mood and affect are within normal limits.  A focused examination was performed including face. Relevant physical exam findings are noted in the Assessment and Plan.  Right Knee Thermal burn from heating pad     chin Spotty PIPA on chin     cheek Stuck-on, waxy, tan-brown papule or plaque --Discussed benign etiology and prognosis.       Assessment & Plan  Thermal burn with dyschromia/hyperpigmentation Right Knee  Start mometasone. Apply daily to affected area 5 days/week x at least 1 month.  May start skin ointment in the future but would like to decrease inflammation first.  mometasone (ELOCON) 0.1 % cream - Right Knee Apply once a day for 5 days a week for at least 1 month to affected area  Post-inflammatory pigmentary changes with some melasma chin Start lightening cream from [facility].  Hydroquinone 8%, tretinoin 0.025%, kojic acid 1%, niacinamide 4%, fluocinolone 0.025%.  Apply a small amount to the affected dark area only daily.   Melasma is a chronic condition of persistent pigmented patches generally on the face, worse in summer due to higher UV exposure.  Oral estrogen containing BCPs or supplements can exacerbate condition.  Recommend daily broad spectrum tinted sunscreen SPF 30+ to face, preferably with Zinc or Titanium Dioxide. Discussed Rx topical bleaching creams (i.e. hydroquinone), OTC HelioCare supplement, chemical peels (would need multiple for best result).   Seborrheic keratosis cheek Benign-appearing.  Observation.  Call clinic for new  or changing lesions.  Recommend daily use of broad spectrum spf 30+ sunscreen to sun-exposed areas.   Return in about 3 months (around 12/29/2021) for recheck leg and face.  IHarriett Sine, CMA, am acting as scribe for Sarina Ser, MD. Documentation: I have reviewed the above documentation for accuracy and completeness, and I agree with the above.  Sarina Ser, MD

## 2021-10-04 ENCOUNTER — Encounter: Payer: Self-pay | Admitting: Dermatology

## 2021-10-12 ENCOUNTER — Ambulatory Visit: Payer: Federal, State, Local not specified - PPO | Admitting: Internal Medicine

## 2021-11-20 ENCOUNTER — Encounter: Payer: Self-pay | Admitting: Internal Medicine

## 2021-11-20 ENCOUNTER — Ambulatory Visit: Payer: Federal, State, Local not specified - PPO | Admitting: Allergy

## 2021-11-20 ENCOUNTER — Ambulatory Visit: Payer: Federal, State, Local not specified - PPO | Admitting: Internal Medicine

## 2021-11-20 ENCOUNTER — Other Ambulatory Visit: Payer: Self-pay | Admitting: Cardiovascular Disease

## 2021-11-20 ENCOUNTER — Other Ambulatory Visit: Payer: Self-pay

## 2021-11-20 VITALS — BP 130/82 | HR 83 | Temp 98.8°F | Ht 65.0 in | Wt 219.0 lb

## 2021-11-20 DIAGNOSIS — E559 Vitamin D deficiency, unspecified: Secondary | ICD-10-CM

## 2021-11-20 DIAGNOSIS — M5413 Radiculopathy, cervicothoracic region: Secondary | ICD-10-CM

## 2021-11-20 DIAGNOSIS — Q0700 Arnold-Chiari syndrome without spina bifida or hydrocephalus: Secondary | ICD-10-CM

## 2021-11-20 DIAGNOSIS — J309 Allergic rhinitis, unspecified: Secondary | ICD-10-CM

## 2021-11-20 DIAGNOSIS — F419 Anxiety disorder, unspecified: Secondary | ICD-10-CM | POA: Diagnosis not present

## 2021-11-20 DIAGNOSIS — J40 Bronchitis, not specified as acute or chronic: Secondary | ICD-10-CM

## 2021-11-20 MED ORDER — GABAPENTIN 300 MG PO CAPS: 300.0000 mg | ORAL_CAPSULE | Freq: Two times a day (BID) | ORAL | 0 refills | Status: DC

## 2021-11-20 MED ORDER — ALBUTEROL SULFATE HFA 108 (90 BASE) MCG/ACT IN AERS: 2.0000 | INHALATION_SPRAY | Freq: Four times a day (QID) | RESPIRATORY_TRACT | 3 refills | Status: AC | PRN

## 2021-11-20 MED ORDER — PROPRANOLOL HCL 10 MG PO TABS: ORAL_TABLET | ORAL | 0 refills | Status: AC

## 2021-11-20 MED ORDER — PREDNISONE 10 MG PO TABS: ORAL_TABLET | ORAL | 0 refills | Status: DC

## 2021-11-20 MED ORDER — ALPRAZOLAM 0.5 MG PO TABS: 0.5000 mg | ORAL_TABLET | Freq: Two times a day (BID) | ORAL | 0 refills | Status: DC | PRN

## 2021-11-20 MED ORDER — SODIUM BICARBONATE 325 MG PO TABS: 325.0000 mg | ORAL_TABLET | Freq: Three times a day (TID) | ORAL | 0 refills | Status: AC | PRN

## 2021-11-20 MED ORDER — ZOLPIDEM TARTRATE 5 MG PO TABS: 2.5000 mg | ORAL_TABLET | Freq: Every evening | ORAL | 0 refills | Status: AC | PRN

## 2021-11-20 NOTE — Patient Instructions (Signed)
Please take all new medication as prescribed  - the prednisone ? ?Please continue all other medications as before, and refills have been done if requested, including the xanax and inhaler and other as you mentioned ? ?Please have the pharmacy call with any other refills you may need. ? ?Please keep your appointments with your specialists as you may have planned ? ?You are given the work note today to be out to Dec 19, 2021 ? ?You may wish to apply for Short Term Disability for this time as well ? ?Please see your PCP in 1 month ? ? ? ?

## 2021-11-20 NOTE — Telephone Encounter (Signed)
This is pt's 3rd and final attempt. Did you want to continue refilling this medication. Please advise. Thanks, ?
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
## 2021-11-22 ENCOUNTER — Encounter (HOSPITAL_COMMUNITY): Payer: Self-pay

## 2021-11-22 ENCOUNTER — Ambulatory Visit (HOSPITAL_COMMUNITY)
Admission: RE | Admit: 2021-11-22 | Discharge: 2021-11-22 | Disposition: A | Discharge status: Home / Self Care | Payer: No Typology Code available for payment source | Source: Ambulatory Visit | Attending: Internal Medicine | Admitting: Internal Medicine

## 2021-11-22 ENCOUNTER — Other Ambulatory Visit: Payer: Self-pay

## 2021-11-22 ENCOUNTER — Encounter: Payer: Self-pay | Admitting: Internal Medicine

## 2021-11-22 VITALS — BP 140/104 | HR 88 | Temp 98.4°F | Resp 20

## 2021-11-22 DIAGNOSIS — Z77098 Contact with and (suspected) exposure to other hazardous, chiefly nonmedicinal, chemicals: Secondary | ICD-10-CM

## 2021-11-22 DIAGNOSIS — J392 Other diseases of pharynx: Secondary | ICD-10-CM

## 2021-11-22 NOTE — Assessment & Plan Note (Signed)
Last vitamin D ?Lab Results  ?Component Value Date  ? VD25OH 38.5 01/05/2021  ? ?Low normal, to restart oral replacement as may assist as well emotionally ?
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
## 2021-11-22 NOTE — Assessment & Plan Note (Signed)
For gabapentin refill as above. ?

## 2021-11-22 NOTE — Assessment & Plan Note (Signed)
With recent flare vs viral URI vs toxic exposure at work , for prednisone asd,  to f/u any worsening symptoms or concerns ?

## 2021-11-22 NOTE — Assessment & Plan Note (Signed)
Lab Results  ?Component Value Date  ? [id number] 1,244 06/28/2018  ? ?Stable, cont oral replacement - b12 1000 mcg qd ? ?

## 2021-11-22 NOTE — Assessment & Plan Note (Signed)
Quite severe today, unable to cope further at work, has already given her 30 day notice; ok for work note for out mar 1 to Dec 19 2021, I would support her ST disability application as well if needed, to restart gabapentin, also for refill xanax x 1 mo bid prn use, consider f/u psychiatry as well ?
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
## 2021-11-22 NOTE — Discharge Instructions (Signed)
On exam there was mild redness noted to your throat but there was no swelling of your throat or of your lymph nodes, however due to your symptoms there may be some possible irritation to the upper airways, begin use of your albuterol inhaler prescribed by your primary care doctor to determine if this will help minimize symptoms, if symptoms continue to persist she may follow-up with your primary care doctor or urgent care for further evaluation ? ?Please call the [facility] employee health and wellness center in the morning for further instructions on how to begin paperwork for your Worker's Comp. ? ?Per your primary care doctor's note he is willing to do paperwork for disability, please reach out to begin that process ?

## 2021-11-23 ENCOUNTER — Ambulatory Visit: Payer: Federal, State, Local not specified - PPO | Admitting: Internal Medicine

## 2021-11-25 DIAGNOSIS — F411 Generalized anxiety disorder: Secondary | ICD-10-CM | POA: Diagnosis not present

## 2021-11-27 DIAGNOSIS — R102 Pelvic and perineal pain: Secondary | ICD-10-CM | POA: Diagnosis not present

## 2021-11-27 DIAGNOSIS — Z113 Encounter for screening for infections with a predominantly sexual mode of transmission: Secondary | ICD-10-CM | POA: Diagnosis not present

## 2021-11-27 DIAGNOSIS — N898 Other specified noninflammatory disorders of vagina: Secondary | ICD-10-CM | POA: Diagnosis not present

## 2021-11-30 ENCOUNTER — Other Ambulatory Visit: Payer: Self-pay

## 2021-11-30 ENCOUNTER — Encounter: Payer: Self-pay | Admitting: Gastroenterology

## 2021-11-30 ENCOUNTER — Ambulatory Visit: Payer: Federal, State, Local not specified - PPO | Admitting: Gastroenterology

## 2021-11-30 VITALS — BP 138/86 | HR 81 | Temp 98.6°F | Ht 65.0 in | Wt 218.2 lb

## 2021-11-30 DIAGNOSIS — R1032 Left lower quadrant pain: Secondary | ICD-10-CM | POA: Diagnosis not present

## 2021-11-30 NOTE — Progress Notes (Signed)
Cephas Darby, MD [address]  [place]  [place], [place] [address]  Main: [phone]  Fax: [phone]    Gastroenterology Consultation  Referring Provider:     Hoyt Koch, * Primary Care Physician:  Hoyt Koch, MD Primary Gastroenterologist:  Dr. Cephas Darby Reason for Consultation:     Left lower quadrant pain        HPI:   Virginia Woods is a 51 y.o. female referred by Dr. Sharlet Salina, Real Cons, MD  for consultation & management of left lower quadrant pain.  Patient reports that about 4 days ago, she developed acute onset of left lower quadrant pain, severe in intensity, localized.  She felt severely bloated and feels like gas trapped in her abdomen, she had some relief after she passed the gas.  She generally has regular bowel movements.  She went to OB/GYN when she had this episode, she said they did some blood work, unaware of the results and she had a pelvic exam, she was told that she has a cyst and fibroid.  When she informed her OB/GYN that she has diverticulosis, she is referred to GI for further management  Patient reports that today is the best day she had since onset of symptoms.  Her pain is improving.  Her last bowel movement was day before yesterday, had a very small one yesterday, associated with straining.  She denies any fever, chills, reports mild nausea only.  NSAIDs: None  Antiplts/Anticoagulants/Anti thrombotics: None  GI Procedures:  Underwent colonoscopy in 2012 for rectal bleeding, found to have diverticulosis   EGD and colonoscopy 06/20/2019 - Normal duodenal bulb and second portion of the duodenum. - Normal stomach. Biopsied. - Normal gastroesophageal junction and esophagus. Biopsied.  - The examined portion of the ileum was normal. - Severe diverticulosis in the entire examined colon. There was no evidence of diverticular bleeding. - The distal rectum and anal verge are normal on retroflexion view. - No  specimens collected.   Past Medical History:  Diagnosis Date   Anemia    Anxiety    Complication of anesthesia    pt reports some complication after 2nd brain surgery, but unsure of what it was.   Dental bridge present    Perrmanent dental retainer - bottom   Depression    Difficult intubation    with 2nd chiari surgery (per pt)   Family history of adverse reaction to anesthesia    reports mother had a "negative reaction" to "anesthesia or lidocaine"   Headache    History of Chiari malformation 2011   2 surgeries 2011 - [facility]   Vitamin B12 deficiency    Vitamin D deficiency    Wears contact lenses     Past Surgical History:  Procedure Laterality Date   BRAIN SURGERY  2011   2 surgeries for chiari malformation - [facility]   COLONOSCOPY  2014   COLONOSCOPY WITH PROPOFOL N/A 06/20/2019   Procedure: COLONOSCOPY WITH PROPOFOL;  Surgeon: Lin Landsman, MD;  Location: [facility];  Service: Endoscopy;  Laterality: N/A;   ESOPHAGOGASTRODUODENOSCOPY (EGD) WITH PROPOFOL N/A 06/20/2019   Procedure: ESOPHAGOGASTRODUODENOSCOPY (EGD) WITH PROPOFOL;  Surgeon: Lin Landsman, MD;  Location: [facility];  Service: Endoscopy;  Laterality: N/A;   TUBAL LIGATION  2002   Current Outpatient Medications:    albuterol (VENTOLIN HFA) 108 (90 Base) MCG/ACT inhaler, Inhale 2 puffs into the lungs every 6 (six) hours  as needed for wheezing or shortness of breath., Disp: 18 g, Rfl: 3   ALPRAZolam (XANAX) 0.5 MG tablet, Take 1 tablet (0.5 mg total) by mouth 2 (two) times daily as needed., Disp: 60 tablet, Rfl: 0   Ascorbic Acid (VITAMIN C PO), Take by mouth., Disp: , Rfl:    benzonatate (TESSALON) 100 MG capsule, Take 1 capsule (100 mg total) by mouth 3 (three) times daily as needed for cough., Disp: 21 capsule, Rfl: 0   buPROPion (WELLBUTRIN XL) 150 MG 24 hr tablet, Take 1 tablet (150 mg total) by mouth daily., Disp: 90 tablet, Rfl: 1   cyclobenzaprine (FLEXERIL)  5 MG tablet, Take 1 tablet (5 mg total) by mouth 3 (three) times daily as needed for muscle spasms., Disp: 30 tablet, Rfl: 5   diphenhydrAMINE (BENADRYL) 25 MG tablet, Take 25 mg by mouth at bedtime as needed for sleep., Disp: , Rfl:    fexofenadine (ALLEGRA) 180 MG tablet, Take 180 mg by mouth daily., Disp: , Rfl:    gabapentin (NEURONTIN) 300 MG capsule, Take 1 capsule (300 mg total) by mouth 2 (two) times daily., Disp: 60 capsule, Rfl: 0   lidocaine (LIDODERM) 5 %, Place 1 patch onto the skin daily. Remove & Discard patch within 12 hours or as directed by MD, Disp: 30 patch, Rfl: 0   metoprolol succinate (TOPROL-XL) 25 MG 24 hr tablet, Take 1 tablet (25 mg total) by mouth daily., Disp: 90 tablet, Rfl: 3   mometasone (ELOCON) 0.1 % cream, Apply once a day for 5 days a week for at least 1 month to affected area, Disp: 30 g, Rfl: 1   nystatin (MYCOSTATIN/NYSTOP) powder, Apply 1 application topically 3 (three) times daily., Disp: 15 g, Rfl: 0   OMEGA-3 FATTY ACIDS PO, , Disp: , Rfl:    omeprazole (PRILOSEC) 40 MG capsule, TAKE 1 CAPSULE (40 MG TOTAL) BY MOUTH 2 (TWO) TIMES DAILY BEFORE A MEAL., Disp: 180 capsule, Rfl: 1   predniSONE (DELTASONE) 10 MG tablet, 3 tabs by mouth per day for 3 days,2tabs per day for 3 days,1tab per day for 3 days, Disp: 18 tablet, Rfl: 0   propranolol (INDERAL) 10 MG tablet, TAKE 1 TABLET (10 MG TOTAL) BY MOUTH 4 (FOUR) TIMES DAILY AS NEEDED (FAST HEART RATE/PALPITATIONS). Please make overdue appt with Dr. Acie Fredrickson. 3rd and Final Attempt, Disp: 30 tablet, Rfl: 0   sodium bicarbonate 325 MG tablet, Take 1 tablet (325 mg total) by mouth 3 (three) times daily as needed for heartburn., Disp: 90 tablet, Rfl: 0   triamcinolone (KENALOG) 0.1 %, Apply 1 application topically 2 (two) times daily., Disp: 30 g, Rfl: 0   Vitamin D, Ergocalciferol, (DRISDOL) 1.25 MG (50000 UNIT) CAPS capsule, TAKE ONE CAPSULE BY MOUTH EVERY 7 DAYS, Disp: 12 capsule, Rfl: 1   zolpidem (AMBIEN) 5 MG tablet,  Take 0.5-1 tablets (2.5-5 mg total) by mouth at bedtime as needed for sleep (do not take within 6 hours of xanax.)., Disp: 30 tablet, Rfl: 0   Cyanocobalamin (VITAMIN B 12 PO), Take by mouth. (Patient not taking: Reported on 11/30/2021), Disp: , Rfl:    levocetirizine (XYZAL) 5 MG tablet, Take 1 tablet (5 mg total) by mouth every evening. (Patient not taking: Reported on 11/30/2021), Disp: 90 tablet, Rfl: 0   loratadine (CLARITIN) 10 MG tablet, Take 10 mg by mouth daily as needed.  (Patient not taking: Reported on 11/30/2021), Disp: , Rfl:     Family History  Problem Relation Age of Onset  Hypertension Mother    Cancer Mother        Laryngeal and lymphoma   Hypertension Sister    Heart disease Maternal Grandmother      Social History   Tobacco Use   Smoking status: Never   Smokeless tobacco: Never  Vaping Use   Vaping Use: Never used  Substance Use Topics   Alcohol use: Yes    Comment: 1-2 glasses wine per month   Drug use: No    Allergies as of 11/30/2021 - Review Complete 11/30/2021  Allergen Reaction Noted   Molds & smuts  07/11/2021   Doxycycline Itching and Rash 03/17/2017   Soy allergy Other (See Comments) 07/11/2021   Tamiflu [oseltamivir phosphate] Rash 08/21/2018    Review of Systems:    All systems reviewed and negative except where noted in HPI.   Physical Exam:  BP 138/86 (BP Location: Left Arm, Patient Position: Sitting, Cuff Size: Normal)    Pulse 81    Temp 98.6 F (37 C) (Oral)    Ht '5\' 5"'$  (1.651 m)    Wt 218 lb 4 oz (99 kg)    BMI 36.32 kg/m  No LMP recorded. Patient is perimenopausal.  General:   Alert,  Well-developed, well-nourished, pleasant and cooperative in NAD Head:  Normocephalic and atraumatic. Eyes:  Sclera clear, no icterus.   Conjunctiva pink. Ears:  Normal auditory acuity. Nose:  No deformity, discharge, or lesions. Mouth:  No deformity or lesions,oropharynx pink & moist. Neck:  Supple; no masses or thyromegaly. Lungs:  Respirations  even and unlabored.  Clear throughout to auscultation.   No wheezes, crackles, or rhonchi. No acute distress. Heart:  Regular rate and rhythm; no murmurs, clicks, rubs, or gallops. Abdomen:  Normal bowel sounds. Soft, left lower quadrant tenderness and non-distended without masses, hepatosplenomegaly or hernias noted.  No guarding or rebound tenderness.   Rectal: Not performed Msk:  Symmetrical without gross deformities. Good, equal movement & strength bilaterally. Pulses:  Normal pulses noted. Extremities:  No clubbing or edema.  No cyanosis. Neurologic:  Alert and oriented x3;  grossly normal neurologically. Skin:  Intact without significant lesions or rashes. No jaundice. Psych:  Alert and cooperative. Normal mood and affect.  Imaging Studies: Reviewed  Assessment and Plan:   Virginia Woods is a 51 y.o. pleasant female with history of anxiety, BMI 36, pancolonic diverticulosis is seen in consultation for 5 days history of acute onset of left lower quadrant pain associated with severe abdominal gas and bloating, some nausea.  Left lower quadrant pain is likely secondary to acute mild diverticulitis and the pain is already improving.  Advised patient to stay on liquid diet today, start bowel regimen.  Check CBC and BMP today.  She will contact my office if the pain is worsening, then we can schedule CT scan.  I do not recommend any antibiotics at this time.  Patient expressed understanding of my recommendations   Follow up as needed   Cephas Darby, MD

## 2021-11-30 NOTE — Patient Instructions (Signed)
Stay on Liquid diet today.  ?Please let us know if the pain has not improved and we can order a CT scan.  ?Gave Miralax samples.  ?You can call Caryl Pina at [phone] ?

## 2021-12-01 LAB — COMPREHENSIVE METABOLIC PANEL
ALT: 14 IU/L (ref 0–32)
AST: 17 IU/L (ref 0–40)
Albumin/Globulin Ratio: 1.7 (ref 1.2–2.2)
Albumin: 4.3 g/dL (ref 3.8–4.9)
Alkaline Phosphatase: 86 IU/L (ref 44–121)
BUN/Creatinine Ratio: 7 — ABNORMAL LOW (ref 9–23)
BUN: 5 mg/dL — ABNORMAL LOW (ref 6–24)
Bilirubin Total: 0.3 mg/dL (ref 0.0–1.2)
CO2: 25 mmol/L (ref 20–29)
Calcium: 9.7 mg/dL (ref 8.7–10.2)
Chloride: 104 mmol/L (ref 96–106)
Creatinine, Ser: 0.76 mg/dL (ref 0.57–1.00)
Globulin, Total: 2.6 g/dL (ref 1.5–4.5)
Glucose: 87 mg/dL (ref 70–99)
Potassium: 4.5 mmol/L (ref 3.5–5.2)
Sodium: 143 mmol/L (ref 134–144)
Total Protein: 6.9 g/dL (ref 6.0–8.5)
eGFR: 95 mL/min/{1.73_m2} (ref >59)

## 2021-12-01 LAB — CBC
Hematocrit: 39.7 % (ref 34.0–46.6)
Hemoglobin: 12.6 g/dL (ref 11.1–15.9)
MCH: 23.9 pg — ABNORMAL LOW (ref 26.6–33.0)
MCHC: 31.7 g/dL (ref 31.5–35.7)
MCV: 75 fL — ABNORMAL LOW (ref 79–97)
Platelets: 287 x10E3/uL (ref 150–450)
RBC: 5.28 x10E6/uL (ref 3.77–5.28)
RDW: 13.2 % (ref 11.7–15.4)
WBC: 3.9 x10E3/uL (ref 3.4–10.8)

## 2021-12-02 ENCOUNTER — Telehealth: Payer: Self-pay | Admitting: Gastroenterology

## 2021-12-02 DIAGNOSIS — R1032 Left lower quadrant pain: Secondary | ICD-10-CM

## 2021-12-02 NOTE — Telephone Encounter (Signed)
Patient is still having the LLQ pain and is wondering if she needs a CT scan  ?

## 2021-12-02 NOTE — Telephone Encounter (Signed)
Informed patient of this information this information and she verbalized understanding of instructions  ?

## 2021-12-02 NOTE — Telephone Encounter (Signed)
Patient called and is still having pain and iscomfort. Requesting call back from [facility] or nurse. Patient also mentioned maybe getting a CT scan. ?

## 2021-12-02 NOTE — Telephone Encounter (Signed)
Got patient schedule for CT scan on 12/15/21 arrived to out patient imaging at 1:15pm for a 1:30pm. Nothing to eat 4 hours before the scan. Pick up oral contrast. Called and left a message for call back  ?

## 2021-12-12 ENCOUNTER — Other Ambulatory Visit: Payer: Self-pay | Admitting: Internal Medicine

## 2021-12-15 ENCOUNTER — Ambulatory Visit: Admission: RE | Admit: 2021-12-15 | Payer: Federal, State, Local not specified - PPO | Source: Ambulatory Visit

## 2021-12-17 ENCOUNTER — Telehealth: Payer: Self-pay | Admitting: Gastroenterology

## 2021-12-17 NOTE — Telephone Encounter (Signed)
Pt left message complaining  lower left side abdominal pain. Pt also states that she is having a lot of bloating after meals its been on and off for about a week. Would like advice on wheather she should get another CT sched. ?

## 2021-12-22 DIAGNOSIS — F411 Generalized anxiety disorder: Secondary | ICD-10-CM | POA: Diagnosis not present

## 2021-12-29 ENCOUNTER — Encounter: Payer: Self-pay | Admitting: Internal Medicine

## 2021-12-29 ENCOUNTER — Ambulatory Visit (INDEPENDENT_AMBULATORY_CARE_PROVIDER_SITE_OTHER): Payer: Federal, State, Local not specified - PPO | Admitting: Internal Medicine

## 2021-12-29 VITALS — BP 124/80 | HR 80 | Resp 18 | Ht 65.0 in | Wt 221.2 lb

## 2021-12-29 DIAGNOSIS — R0683 Snoring: Secondary | ICD-10-CM | POA: Diagnosis not present

## 2021-12-29 DIAGNOSIS — Q0700 Arnold-Chiari syndrome without spina bifida or hydrocephalus: Secondary | ICD-10-CM

## 2021-12-29 DIAGNOSIS — Z0000 Encounter for general adult medical examination without abnormal findings: Secondary | ICD-10-CM | POA: Diagnosis not present

## 2021-12-29 DIAGNOSIS — G8928 Other chronic postprocedural pain: Secondary | ICD-10-CM | POA: Diagnosis not present

## 2021-12-29 LAB — VITAMIN B12: Vitamin B-12: 654 pg/mL (ref 211–911)

## 2021-12-29 LAB — LIPID PANEL
Cholesterol: 155 mg/dL (ref 0–200)
HDL: 55.7 mg/dL (ref >39.00)
LDL Cholesterol: 79 mg/dL (ref 0–99)
NonHDL: 98.8
Total CHOL/HDL Ratio: 3
Triglycerides: 99 mg/dL (ref 0.0–149.0)
VLDL: 19.8 mg/dL (ref 0.0–40.0)

## 2021-12-29 LAB — TSH: TSH: 1.25 u[IU]/mL (ref 0.35–5.50)

## 2021-12-29 LAB — VITAMIN D 25 HYDROXY (VIT D DEFICIENCY, FRACTURES): VITD: 20.98 ng/mL — ABNORMAL LOW (ref 30.00–100.00)

## 2021-12-29 LAB — HEMOGLOBIN A1C: Hgb A1c MFr Bld: 5.8 % (ref 4.6–6.5)

## 2021-12-29 NOTE — Progress Notes (Signed)
? ?  Subjective:  ? ?Patient ID: Virginia Woods, female    DOB: [birth year], 51 y.o.   MRN: [id number] ? ?HPI ?The patient is here for physical. ? ?PMH, [facility], social history reviewed and updated ? ?Review of Systems  ?Constitutional: Negative.   ?HENT: Negative.    ?Eyes: Negative.   ?Respiratory:  Negative for cough, chest tightness and shortness of breath.   ?Cardiovascular:  Negative for chest pain, palpitations and leg swelling.  ?Gastrointestinal:  Negative for abdominal distention, abdominal pain, constipation, diarrhea, nausea and vomiting.  ?Musculoskeletal:  Positive for arthralgias.  ?Skin: Negative.   ?Neurological: Negative.   ?     Snoring  ?Psychiatric/Behavioral: Negative.    ? ?Objective:  ?Physical Exam ?Constitutional:   ?   Appearance: She is well-developed.  ?HENT:  ?   Head: Normocephalic and atraumatic.  ?Cardiovascular:  ?   Rate and Rhythm: Normal rate and regular rhythm.  ?Pulmonary:  ?   Effort: Pulmonary effort is normal. No respiratory distress.  ?   Breath sounds: Normal breath sounds. No wheezing or rales.  ?Abdominal:  ?   General: Bowel sounds are normal. There is no distension.  ?   Palpations: Abdomen is soft.  ?   Tenderness: There is no abdominal tenderness. There is no rebound.  ?Musculoskeletal:  ?   Cervical back: Normal range of motion.  ?Skin: ?   General: Skin is warm and dry.  ?Neurological:  ?   Mental Status: She is alert and oriented to person, place, and time.  ?   Coordination: Coordination normal.  ? ? ?Vitals:  ? 12/29/21 0826  ?BP: 124/80  ?Pulse: 80  ?Resp: 18  ?SpO2: 97%  ?Weight: 221 lb 3.2 oz (100.3 kg)  ?Height: '5\' 5"'$  (1.651 m)  ? ? ?This visit occurred during the SARS-CoV-2 public health emergency.  Safety protocols were in place, including screening questions prior to the visit, additional usage of staff PPE, and extensive cleaning of exam room while observing appropriate contact time as indicated for disinfecting solutions.  ? ?Assessment & Plan:  ? ?

## 2021-12-29 NOTE — Patient Instructions (Addendum)
For the weight loss you could try wegovy, saxenda, contrave, qsymia. ? ?We will check the labs and the sleep study ? ? ?

## 2021-12-30 ENCOUNTER — Encounter: Payer: Self-pay | Admitting: Internal Medicine

## 2021-12-30 ENCOUNTER — Ambulatory Visit: Payer: Federal, State, Local not specified - PPO | Admitting: Dermatology

## 2021-12-30 DIAGNOSIS — Z0000 Encounter for general adult medical examination without abnormal findings: Secondary | ICD-10-CM | POA: Insufficient documentation

## 2021-12-30 DIAGNOSIS — R0683 Snoring: Secondary | ICD-10-CM | POA: Insufficient documentation

## 2021-12-30 NOTE — Assessment & Plan Note (Signed)
Flu shot counseled. Covid-19 counseled. Shingrix declines. Tetanus declines. Colonoscopy up to date. Mammogram up to date, pap smear up to date. Counseled about sun safety and mole surveillance. Counseled about the dangers of distracted driving. Given 10 year screening recommendations.  ? ?

## 2021-12-30 NOTE — Assessment & Plan Note (Signed)
She is willing to do sleep test which is ordered today for home test ?

## 2021-12-30 NOTE — Assessment & Plan Note (Signed)
Overall stable but with some possible neuropathy changes in the leg and arm.  ?

## 2021-12-30 NOTE — Assessment & Plan Note (Signed)
Different lately and in the joints. Will check ANA panel to rule out auto-immune cause.  ?

## 2022-01-01 LAB — ANA, IFA COMPREHENSIVE PANEL
Anti Nuclear Antibody (ANA): POSITIVE — AB
ENA SM Ab Ser-aCnc: <1 AI
SM/RNP: <1 AI
SSA (Ro) (ENA) Antibody, IgG: <1 AI
SSB (La) (ENA) Antibody, IgG: <1 AI
Scleroderma (Scl-70) (ENA) Antibody, IgG: <1 AI
ds DNA Ab: 1 IU/mL

## 2022-01-01 LAB — ANTI-NUCLEAR AB-TITER (ANA TITER): ANA Titer 1: 1:40 {titer} — ABNORMAL HIGH

## 2022-01-02 DIAGNOSIS — S83281A Other tear of lateral meniscus, current injury, right knee, initial encounter: Secondary | ICD-10-CM | POA: Diagnosis not present

## 2022-01-04 ENCOUNTER — Encounter: Payer: Self-pay | Admitting: Internal Medicine

## 2022-01-04 DIAGNOSIS — M2391 Unspecified internal derangement of right knee: Secondary | ICD-10-CM | POA: Diagnosis not present

## 2022-01-05 ENCOUNTER — Telehealth: Payer: Self-pay

## 2022-01-05 NOTE — Telephone Encounter (Signed)
Pt is calling in with questions about the labs. She is concerned with the ANA showing a mild POS and not knowing if her Joint pain related. Vit D was low and was wondering if that could be related to the joint pain. Pt is wanting to know if there are different labs that can be ran to pin point the cause for the joint pain she would like that to happen. Also if going to see a rheumatologist would benefit she wants to do that as well. Joint pain started about 2 or 3 weeks ago, Left arm and Left leg. Pt reports swelling in ankles no pain associated with it. ?Pt also wanted to make you aware that she started having stomach issues that is being treated by GI but is wondering if this all is related. ? ? ?Pt expressed that she was used to having a more through physical like the breast exams and pelvic exam. I did explain that the providers at this location doesn't complete those exam but would/could refer you to GYN if needed. Pt is an est pt of a GYN. ? ?Please advise ? ? ?

## 2022-01-05 NOTE — Telephone Encounter (Signed)
The ANA is likely a false positive which means this is likely not related to her joint pains. For the vitamin D she can take 2000 IU vitamin D daily to help. It is unclear without more information if the stomach issues are related. Typically only 1 breast/pelvis exam maximum would be required so her gyn would do this.  ?

## 2022-01-06 NOTE — Telephone Encounter (Signed)
Spoke with the pt and she verbalized understanding Dr. Nathanial Millman recommendations. No other questions or concerns.  ?

## 2022-01-06 NOTE — Telephone Encounter (Signed)
Pt checking status of provider's response ? ?Advised pt of response, pt had further questions, call transferred to cma ?

## 2022-01-14 NOTE — Progress Notes (Signed)
HEMATOLOGY-ONCOLOGY TELEPHONE VISIT PROGRESS NOTE ? ?I connected with [name] on 01/28/22 at  2:00 PM EDT by telephone and verified that I am speaking with the correct person using two identifiers.  ?I discussed the limitations, risks, security and privacy concerns of performing an evaluation and management service by telephone and the availability of in person appointments.  ?I also discussed with the patient that there may be a patient responsible charge related to this service. The patient expressed understanding and agreed to proceed.  ? ?History of Present Illness: Virginia Woods is a 51 y.o. female with above-mentioned history of iron deficiency anemia. She presents to the clinic today for a telephone follow-up. She's feeling symptoms of fatigue.  ?  ?REVIEW OF SYSTEMS:   ?Constitutional: Denies fevers, chills or abnormal weight loss ?All other systems were reviewed with the patient and are negative. ?Observations/Objective:  ?  ?Assessment Plan:  ?Anemia, iron deficiency ?Lab review ?2020: Iron saturation 7%, ferritin 13 ?2021: Iron saturation 7%, ferritin 23 ?01/05/2021: Iron saturation 8%, ferritin 13, TIBC 420, hemoglobin 11.5, MCV 73 ?07/24/2021: Hemoglobin 12.1, MCV 74 ferritin 26, iron saturation 16% ?01/26/2022: Hemoglobin 13.5, MCV 77, iron saturation 18%, TIBC 451, ferritin 26 ?  ?Iron deficiency with only mild anemia: Longstanding ?Differential diagnosis blood loss versus malabsorption (she had colonoscopy 2 years ago and that was negative for bleeding, she does have couple of days of heavy menstrual cycles every month.) ?  ?No role of IV iron at this time.  ? ?Return to clinic in 1 year for labs and then follow-up with the telephone visit 2 days later ? ? ? ?I discussed the assessment and treatment plan with the patient. The patient was provided an opportunity to ask questions and all were answered. The patient agreed with the plan and demonstrated an understanding of the instructions. The patient  was advised to call back or seek an in-person evaluation if the symptoms worsen or if the condition fails to improve as anticipated.  ? ?I provided 12 minutes of non-face-to-face time during this encounter. Harriette Ohara, MD   ?Earlie Server am scribing for Dr. Lindi Adie ? ?I have reviewed the above documentation for accuracy and completeness, and I agree with the above. ?  ?

## 2022-01-18 ENCOUNTER — Encounter: Payer: Self-pay | Admitting: Internal Medicine

## 2022-01-18 ENCOUNTER — Other Ambulatory Visit: Payer: Federal, State, Local not specified - PPO

## 2022-01-18 DIAGNOSIS — H02409 Unspecified ptosis of unspecified eyelid: Secondary | ICD-10-CM

## 2022-01-21 ENCOUNTER — Ambulatory Visit: Payer: Federal, State, Local not specified - PPO | Admitting: Dermatology

## 2022-01-22 ENCOUNTER — Other Ambulatory Visit: Payer: Self-pay | Admitting: *Deleted

## 2022-01-22 DIAGNOSIS — D508 Other iron deficiency anemias: Secondary | ICD-10-CM

## 2022-01-26 ENCOUNTER — Inpatient Hospital Stay: Payer: Federal, State, Local not specified - PPO | Attending: Hematology and Oncology

## 2022-01-26 ENCOUNTER — Other Ambulatory Visit: Payer: Self-pay

## 2022-01-26 DIAGNOSIS — D508 Other iron deficiency anemias: Secondary | ICD-10-CM

## 2022-01-26 DIAGNOSIS — D509 Iron deficiency anemia, unspecified: Secondary | ICD-10-CM | POA: Insufficient documentation

## 2022-01-26 LAB — IRON AND IRON BINDING CAPACITY (CC-WL,HP ONLY)
Iron: 83 ug/dL (ref 28–170)
Saturation Ratios: 18 % (ref 10.4–31.8)
TIBC: 451 ug/dL — ABNORMAL HIGH (ref 250–450)
UIBC: 368 ug/dL (ref 148–442)

## 2022-01-26 LAB — CBC WITH DIFFERENTIAL (CANCER CENTER ONLY)
Abs Immature Granulocytes: 0.02 10*3/uL (ref 0.00–0.07)
Basophils Absolute: 0.1 10*3/uL (ref 0.0–0.1)
Basophils Relative: 1 %
Eosinophils Absolute: 0 10*3/uL (ref 0.0–0.5)
Eosinophils Relative: 1 %
HCT: 43.3 % (ref 36.0–46.0)
Hemoglobin: 13.5 g/dL (ref 12.0–15.0)
Immature Granulocytes: 0 %
Lymphocytes Relative: 31 %
Lymphs Abs: 2 10*3/uL (ref 0.7–4.0)
MCH: 24 pg — ABNORMAL LOW (ref 26.0–34.0)
MCHC: 31.2 g/dL (ref 30.0–36.0)
MCV: 77 fL — ABNORMAL LOW (ref 80.0–100.0)
Monocytes Absolute: 0.5 10*3/uL (ref 0.1–1.0)
Monocytes Relative: 8 %
Neutro Abs: 3.8 10*3/uL (ref 1.7–7.7)
Neutrophils Relative %: 59 %
Platelet Count: 307 10*3/uL (ref 150–400)
RBC: 5.62 MIL/uL — ABNORMAL HIGH (ref 3.87–5.11)
RDW: 15.7 % — ABNORMAL HIGH (ref 11.5–15.5)
WBC Count: 6.4 10*3/uL (ref 4.0–10.5)
nRBC: 0 % (ref 0.0–0.2)

## 2022-01-26 LAB — FERRITIN: Ferritin: 26 ng/mL (ref 11–307)

## 2022-01-27 ENCOUNTER — Telehealth: Payer: Self-pay | Admitting: Internal Medicine

## 2022-01-27 LAB — STRIATED MUSCLE ANTIBODY: STRIATED MUSCLE AB SCREEN: NEGATIVE

## 2022-01-27 LAB — ACETYLCHOLINE RECEPTOR, BINDING: A CHR BINDING ABS: <0.3 nmol/L

## 2022-01-27 NOTE — Telephone Encounter (Signed)
PT calls today in regards to labs from 05/01. I had let her know that it looked like we had not received them yet on our end but let her know that we would call her/message (my chart) if we do happen to receive anything! ? ?CB: [phone] ?

## 2022-01-28 ENCOUNTER — Inpatient Hospital Stay (HOSPITAL_BASED_OUTPATIENT_CLINIC_OR_DEPARTMENT_OTHER): Payer: Federal, State, Local not specified - PPO | Admitting: Hematology and Oncology

## 2022-01-28 DIAGNOSIS — D508 Other iron deficiency anemias: Secondary | ICD-10-CM

## 2022-01-28 NOTE — Telephone Encounter (Signed)
Done now

## 2022-01-28 NOTE — Assessment & Plan Note (Addendum)
Lab review ?2020: Iron saturation 7%, ferritin 13 ?2021: Iron saturation 7%, ferritin 23 ?01/05/2021: Iron saturation 8%, ferritin 13, TIBC 420, hemoglobin 11.5, MCV 73 ?07/24/2021: Hemoglobin 12.1, MCV 74 ferritin 26, iron saturation 16% ?01/26/2022: Hemoglobin 13.5, MCV 77, iron saturation 18%, TIBC 451, ferritin 26 ?? 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 ?No role of IV iron at this time.  ? ?Return to clinic in 1 year for labs and then follow-up with the telephone visit 2 days later ?

## 2022-01-29 ENCOUNTER — Telehealth: Payer: Self-pay | Admitting: Hematology and Oncology

## 2022-01-29 NOTE — Telephone Encounter (Signed)
Scheduled appointment per 5/12 los. Patient is aware. ?

## 2022-02-04 DIAGNOSIS — H02402 Unspecified ptosis of left eyelid: Secondary | ICD-10-CM | POA: Diagnosis not present

## 2022-02-15 ENCOUNTER — Encounter: Payer: Self-pay | Admitting: Emergency Medicine

## 2022-02-15 ENCOUNTER — Ambulatory Visit
Admission: EM | Admit: 2022-02-15 | Discharge: 2022-02-15 | Disposition: A | Discharge status: Home / Self Care | Payer: Federal, State, Local not specified - PPO | Attending: Emergency Medicine | Admitting: Emergency Medicine

## 2022-02-15 DIAGNOSIS — H6693 Otitis media, unspecified, bilateral: Secondary | ICD-10-CM | POA: Diagnosis not present

## 2022-02-15 DIAGNOSIS — J0100 Acute maxillary sinusitis, unspecified: Secondary | ICD-10-CM

## 2022-02-15 MED ORDER — AMOXICILLIN 875 MG PO TABS: 875.0000 mg | ORAL_TABLET | Freq: Two times a day (BID) | ORAL | 0 refills | Status: AC

## 2022-02-15 NOTE — ED Provider Notes (Signed)
Roderic Palau    CSN: [id number] Arrival date & time: 02/15/22  1652      History   Chief Complaint Chief Complaint  Patient presents with   Nasal Congestion   Cough    HPI Virginia Woods is a 51 y.o. female.  Patient presents with 3-day history of sinus pressure, congestion, postnasal drip, cough.  She also has ear pain bilaterally.  She denies fever, rash, sore throat, shortness of breath, vomiting, diarrhea, or other symptoms.  Several OTC treatments attempted including saline nasal spray, Sudafed, Mucinex, ibuprofen.    The history is provided by the patient and medical records.   Past Medical History:  Diagnosis Date   Anemia    Anxiety    Complication of anesthesia    pt reports some complication after 2nd brain surgery, but unsure of what it was.   Dental bridge present    Perrmanent dental retainer - bottom   Depression    Difficult intubation    with 2nd chiari surgery (per pt)   Family history of adverse reaction to anesthesia    reports mother had a "negative reaction" to "anesthesia or lidocaine"   Headache    History of Chiari malformation 2011   2 surgeries 2011 - [facility]   Vitamin B12 deficiency    Vitamin D deficiency    Wears contact lenses     Patient Active Problem List   Diagnosis Date Noted   Snoring 12/30/2021   Routine general medical examination at a health care facility 12/30/2021   Right shoulder pain 06/26/2021   Morbid obesity ([place]) 06/26/2021   Anemia 10/22/2020   Migraine 10/22/2020   Moderate episode of recurrent major depressive disorder ([place]) 03/30/2020   GERD (gastroesophageal reflux disease)    SVT (supraventricular tachycardia) ([place]) 04/10/2019   Mobitz type 2 second degree heart block 03/19/2019   Cervical radiculopathy 06/28/2018   Herpes simplex vulvovaginitis 03/17/2017   Allergic rhinitis 05/20/2015   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   Chronic pain 05/20/2015   Dermatitis, eczematoid 05/20/2015    Apnea, sleep 05/20/2015   Hypo-osmolality and hyponatremia 10/13/2009   Anemia, iron deficiency 05/16/2009   Anxiety 11/05/2004    Past Surgical History:  Procedure Laterality Date   BRAIN SURGERY  2011   2 surgeries for chiari malformation - [place]   COLONOSCOPY  2014   COLONOSCOPY WITH PROPOFOL N/A 06/20/2019   Procedure: COLONOSCOPY WITH PROPOFOL;  Surgeon: Lin Landsman, MD;  Location: [place];  Service: Endoscopy;  Laterality: N/A;   ESOPHAGOGASTRODUODENOSCOPY (EGD) WITH PROPOFOL N/A 06/20/2019   Procedure: ESOPHAGOGASTRODUODENOSCOPY (EGD) WITH PROPOFOL;  Surgeon: Lin Landsman, MD;  Location: [facility];  Service: Endoscopy;  Laterality: N/A;   TUBAL LIGATION  2002    OB History   No obstetric history on file.      Home Medications    Prior to Admission medications   Medication Sig Start Date End Date Taking? Authorizing Provider  amoxicillin (AMOXIL) 875 MG tablet Take 1 tablet (875 mg total) by mouth 2 (two) times daily for 10 days. 02/15/22 02/25/22 Yes Sharion Balloon, NP  albuterol (VENTOLIN HFA) 108 (90 Base) MCG/ACT inhaler Inhale 2 puffs into the lungs every 6 (six) hours as needed for wheezing or shortness of breath. 11/20/21   Biagio Borg, MD  ALPRAZolam Duanne Moron) 0.5 MG tablet Take 1 tablet (0.5 mg total) by mouth 2 (two) times daily as needed. 11/20/21   Jenny Reichmann,  Hunt Oris, MD  Ascorbic Acid (VITAMIN C PO) Take by mouth.    [provider]  benzonatate (TESSALON) 100 MG capsule Take 1 capsule (100 mg total) by mouth 3 (three) times daily as needed for cough. 07/21/21   Sharion Balloon, NP  buPROPion (WELLBUTRIN XL) 150 MG 24 hr tablet TAKE 1 TABLET BY MOUTH EVERY DAY 12/14/21   Hoyt Koch, MD  Cyanocobalamin (VITAMIN B 12 PO) Take by mouth.    [provider]  cyclobenzaprine (FLEXERIL) 5 MG tablet Take 1 tablet (5 mg total) by mouth 3 (three) times daily as needed for muscle spasms. 03/26/20    Mar Daring, PA-C  diphenhydrAMINE (BENADRYL) 25 MG tablet Take 25 mg by mouth at bedtime as needed for sleep.    [provider]  fexofenadine (ALLEGRA) 180 MG tablet Take 180 mg by mouth daily.    [provider]  gabapentin (NEURONTIN) 300 MG capsule Take 1 capsule (300 mg total) by mouth 2 (two) times daily. 11/20/21   Biagio Borg, MD  levocetirizine (XYZAL) 5 MG tablet Take 1 tablet (5 mg total) by mouth every evening. 01/05/21   Flinchum, Kelby Aline, FNP  loratadine (CLARITIN) 10 MG tablet Take 10 mg by mouth daily as needed.    [provider]  metoprolol succinate (TOPROL-XL) 25 MG 24 hr tablet Take 1 tablet (25 mg total) by mouth daily. 03/26/20   Mar Daring, PA-C  mometasone (ELOCON) 0.1 % cream Apply once a day for 5 days a week for at least 1 month to affected area 09/30/21   Ralene Bathe, MD  nystatin (MYCOSTATIN/NYSTOP) powder Apply 1 application topically 3 (three) times daily. 08/20/20   Mar Daring, PA-C  OMEGA-3 FATTY ACIDS PO  05/01/09   [provider]  propranolol (INDERAL) 10 MG tablet TAKE 1 TABLET (10 MG TOTAL) BY MOUTH 4 (FOUR) TIMES DAILY AS NEEDED (FAST HEART RATE/PALPITATIONS). Please make overdue appt with Dr. Acie Fredrickson. 3rd and Final Attempt 11/20/21   Biagio Borg, MD  sodium bicarbonate 325 MG tablet Take 1 tablet (325 mg total) by mouth 3 (three) times daily as needed for heartburn. 11/20/21   Biagio Borg, MD  triamcinolone (KENALOG) 0.1 % Apply 1 application topically 2 (two) times daily. 08/20/20   Mar Daring, PA-C  Vitamin D, Ergocalciferol, (DRISDOL) 1.25 MG (50000 UNIT) CAPS capsule TAKE ONE CAPSULE BY MOUTH EVERY 7 DAYS 11/29/20   Flinchum, Kelby Aline, FNP  zolpidem (AMBIEN) 5 MG tablet Take 0.5-1 tablets (2.5-5 mg total) by mouth at bedtime as needed for sleep (do not take within 6 hours of xanax.). 11/20/21   Biagio Borg, MD    Family History Family History  Problem Relation Age of Onset    Hypertension Mother    Cancer Mother        Laryngeal and lymphoma   Hypertension Sister    Heart disease Maternal Grandmother     Social History Social History   Tobacco Use   Smoking status: Never   Smokeless tobacco: Never  Vaping Use   Vaping Use: Never used  Substance Use Topics   Alcohol use: Yes    Comment: 1-2 glasses wine per month   Drug use: No     Allergies   Molds & smuts, Doxycycline, Soy allergy, and Tamiflu [oseltamivir phosphate]   Review of Systems Review of Systems  Constitutional:  Negative for chills and fever.  HENT:  Positive for congestion, ear  pain, postnasal drip and rhinorrhea. Negative for sore throat.   Respiratory:  Positive for cough. Negative for shortness of breath.   Cardiovascular:  Negative for chest pain and palpitations.  Gastrointestinal:  Negative for diarrhea and vomiting.  Skin:  Negative for color change and rash.  All other systems reviewed and are negative.   Physical Exam Triage Vital Signs ED Triage Vitals  Enc Vitals Group     BP      Pulse      Resp      Temp      Temp src      SpO2      Weight      Height      Head Circumference      Peak Flow      Pain Score      Pain Loc      Pain Edu?      Excl. in [place]?    No data found.  Updated Vital Signs BP 131/83   Pulse 98   Temp 98.2 F (36.8 C)   Resp 18   SpO2 96%   Visual Acuity Right Eye Distance:   Left Eye Distance:   Bilateral Distance:    Right Eye Near:   Left Eye Near:    Bilateral Near:     Physical Exam Vitals and nursing note reviewed.  Constitutional:      General: She is not in acute distress.    Appearance: Normal appearance. She is well-developed. She is not ill-appearing.  HENT:     Right Ear: Tympanic membrane is erythematous.     Left Ear: Tympanic membrane is erythematous.     Nose: Congestion and rhinorrhea present.     Mouth/Throat:     Mouth: Mucous membranes are moist.     Pharynx: Oropharynx is clear.   Cardiovascular:     Rate and Rhythm: Normal rate and regular rhythm.     Heart sounds: Normal heart sounds.  Pulmonary:     Effort: Pulmonary effort is normal. No respiratory distress.     Breath sounds: Normal breath sounds.  Musculoskeletal:     Cervical back: Neck supple.  Skin:    General: Skin is warm and dry.  Neurological:     Mental Status: She is alert.  Psychiatric:        Mood and Affect: Mood normal.        Behavior: Behavior normal.     UC Treatments / Results  Labs (all labs ordered are listed, but only abnormal results are displayed) Labs Reviewed - No data to display  EKG   Radiology No results found.  Procedures Procedures (including critical care time)  Medications Ordered in UC Medications - No data to display  Initial Impression / Assessment and Plan / UC Course  I have reviewed the triage vital signs and the nursing notes.  Pertinent labs & imaging results that were available during my care of the patient were reviewed by me and considered in my medical decision making (see chart for details).   Bilateral otitis media, acute sinusitis. Treating with amoxicillin.  Discussed Tylenol or ibuprofen as needed.  Instructed patient to follow up with her PCP if her symptoms are not improving.  She agrees to plan of care.     Final Clinical Impressions(s) / UC Diagnoses   Final diagnoses:  Bilateral otitis media, unspecified otitis media type  Acute non-recurrent maxillary sinusitis     Discharge Instructions  Take the amoxicillin for your ear infection and sinus infection.  Follow up with your primary care provider if your symptoms are not improving.        ED Prescriptions     Medication Sig Dispense Auth. Provider   amoxicillin (AMOXIL) 875 MG tablet Take 1 tablet (875 mg total) by mouth 2 (two) times daily for 10 days. 20 tablet Sharion Balloon, NP      PDMP not reviewed this encounter.   Sharion Balloon, NP 02/15/22 1721

## 2022-02-15 NOTE — ED Triage Notes (Signed)
Pt reports nasal congestion x 3 days and cough x 2 days. States have tried using Saline spray, mucinex and sudafed with no relief.

## 2022-02-15 NOTE — Discharge Instructions (Addendum)
Take the amoxicillin for your ear infection and sinus infection.  Follow up with your primary care provider if your symptoms are not improving.

## 2022-03-05 ENCOUNTER — Ambulatory Visit (HOSPITAL_BASED_OUTPATIENT_CLINIC_OR_DEPARTMENT_OTHER): Payer: Federal, State, Local not specified - PPO | Attending: Internal Medicine | Admitting: Internal Medicine

## 2022-03-05 DIAGNOSIS — R0683 Snoring: Secondary | ICD-10-CM | POA: Insufficient documentation

## 2022-03-05 DIAGNOSIS — G4733 Obstructive sleep apnea (adult) (pediatric): Secondary | ICD-10-CM | POA: Insufficient documentation

## 2022-03-14 DIAGNOSIS — R0683 Snoring: Secondary | ICD-10-CM

## 2022-03-29 ENCOUNTER — Encounter: Payer: Self-pay | Admitting: Internal Medicine

## 2022-03-30 ENCOUNTER — Encounter: Payer: Self-pay | Admitting: Internal Medicine

## 2022-04-09 ENCOUNTER — Encounter: Payer: Self-pay | Admitting: Internal Medicine

## 2022-04-09 DIAGNOSIS — R768 Other specified abnormal immunological findings in serum: Secondary | ICD-10-CM

## 2022-04-09 DIAGNOSIS — M2550 Pain in unspecified joint: Secondary | ICD-10-CM

## 2022-04-22 ENCOUNTER — Encounter: Payer: Self-pay | Admitting: Internal Medicine

## 2022-05-07 IMAGING — MG MM DIGITAL SCREENING BILAT W/ TOMO AND CAD
6 of 10 series · 6 of 30 positions shown · non-contrast
Comparison: None.

CLINICAL DATA: Screening.

EXAM:
DIGITAL SCREENING BILATERAL MAMMOGRAM WITH TOMOSYNTHESIS AND CAD
TECHNIQUE: Bilateral screening digital craniocaudal and mediolateral oblique
mammograms were obtained. Bilateral screening digital breast
tomosynthesis was performed. The images were evaluated with
computer-aided detection.

[R MLO synth-2D (1 of 2)]
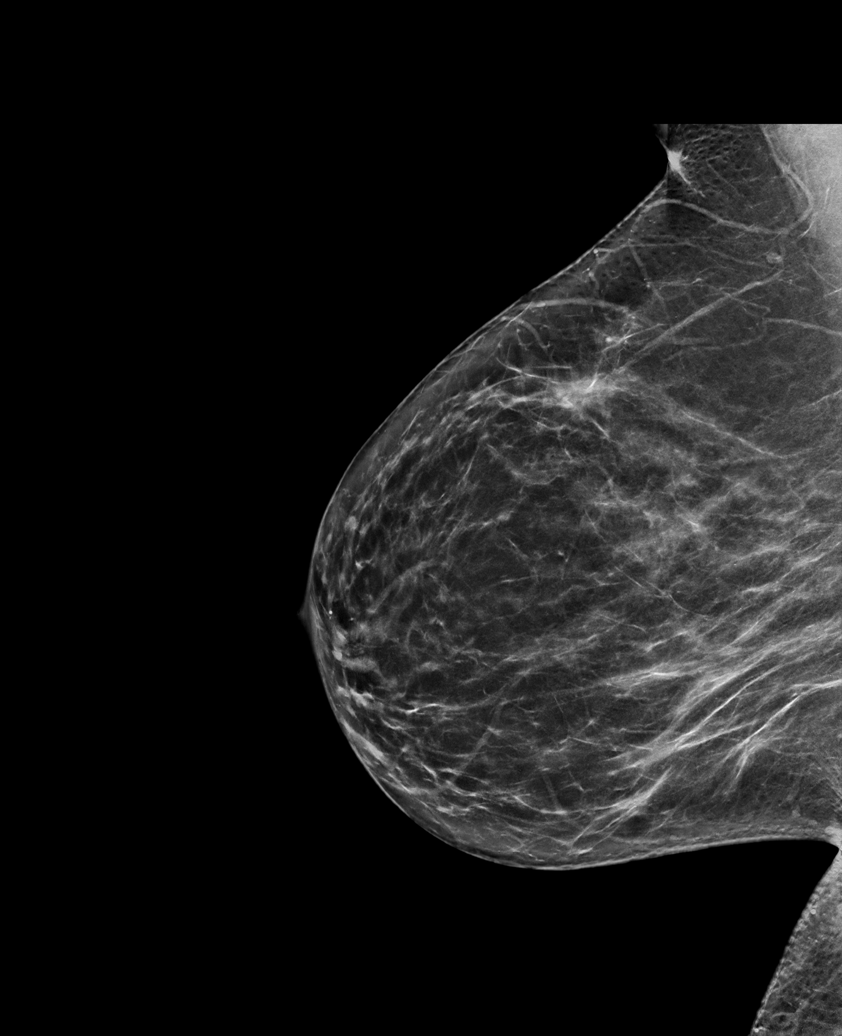

[L CC synth-2D]
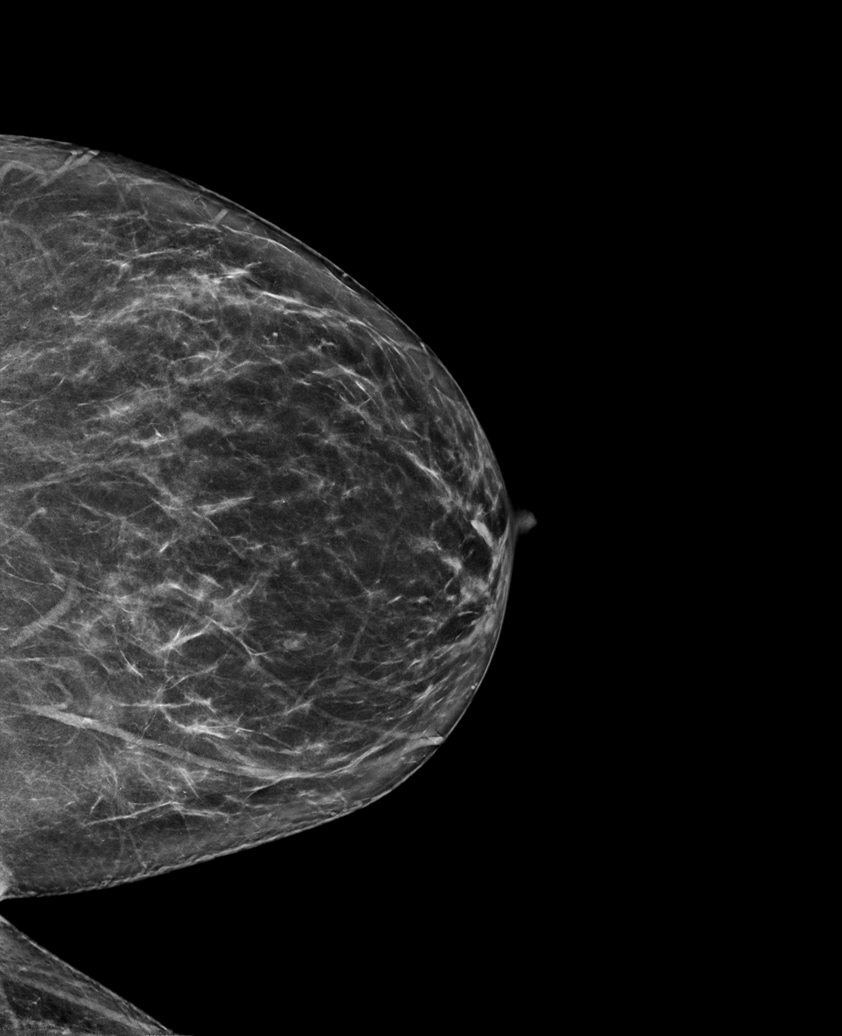

[R CC synth-2D]
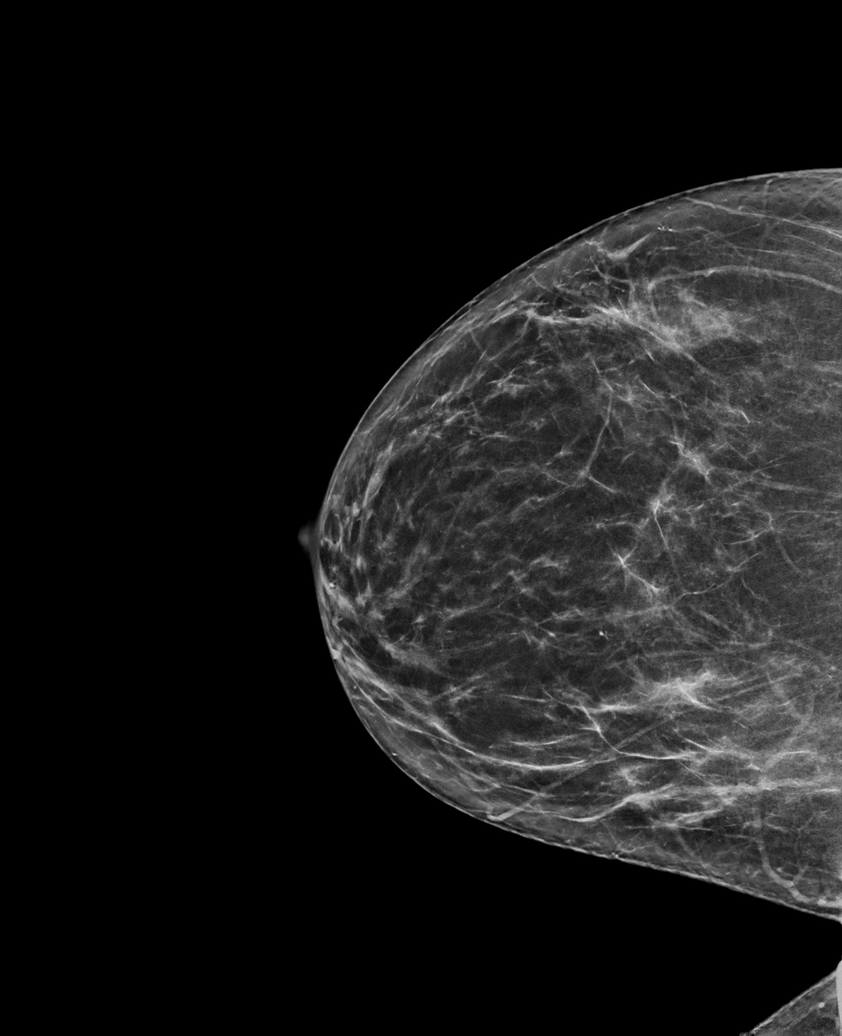

[L MLO synth-2D]
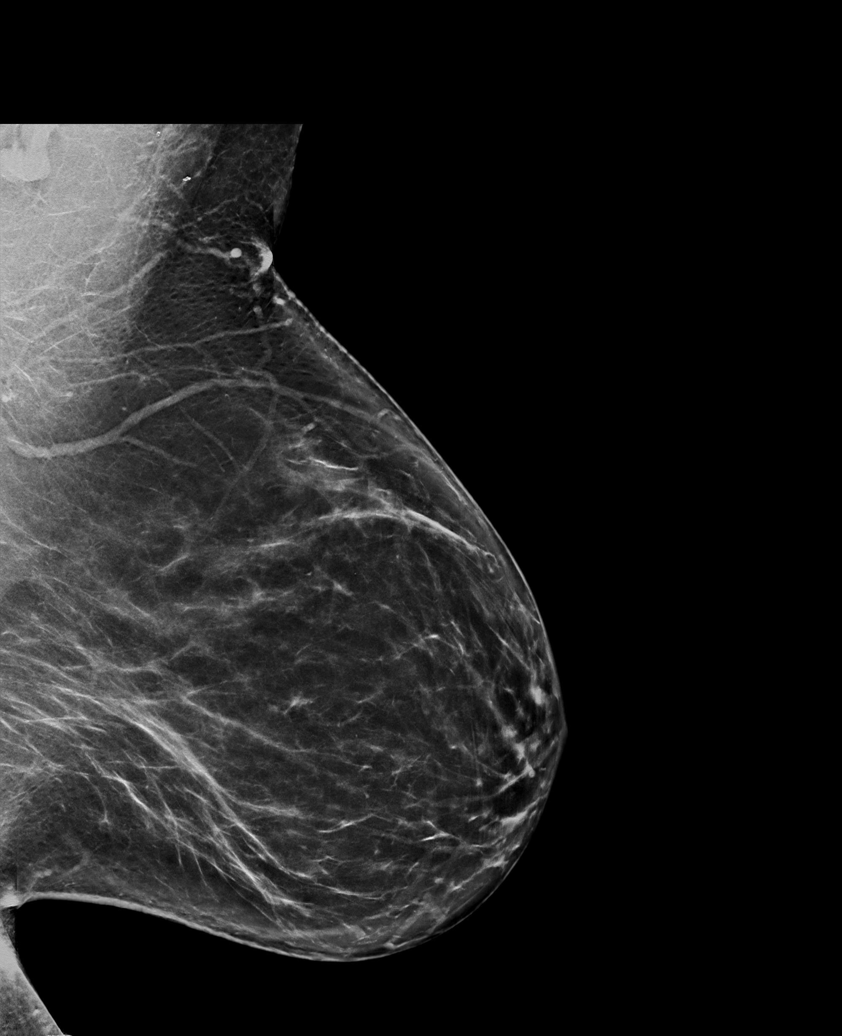

[R MLO synth-2D (2 of 2)]
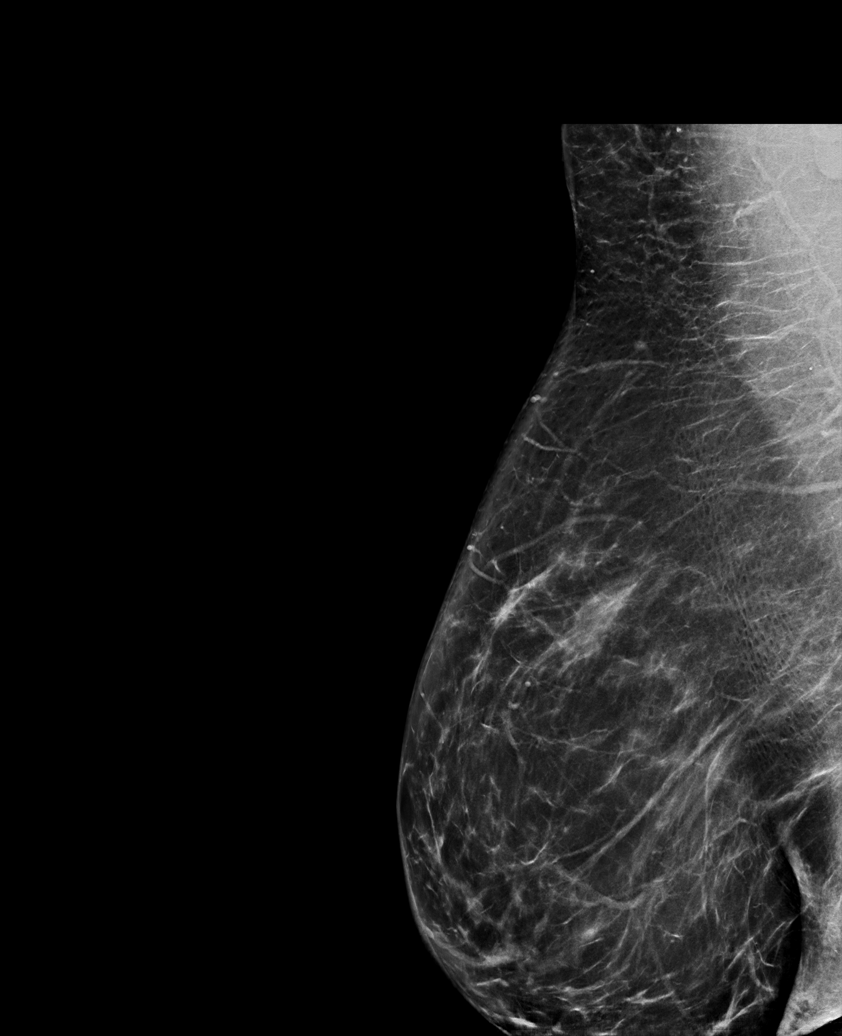

[R MLO tomo · tomo slice 37/73.0]
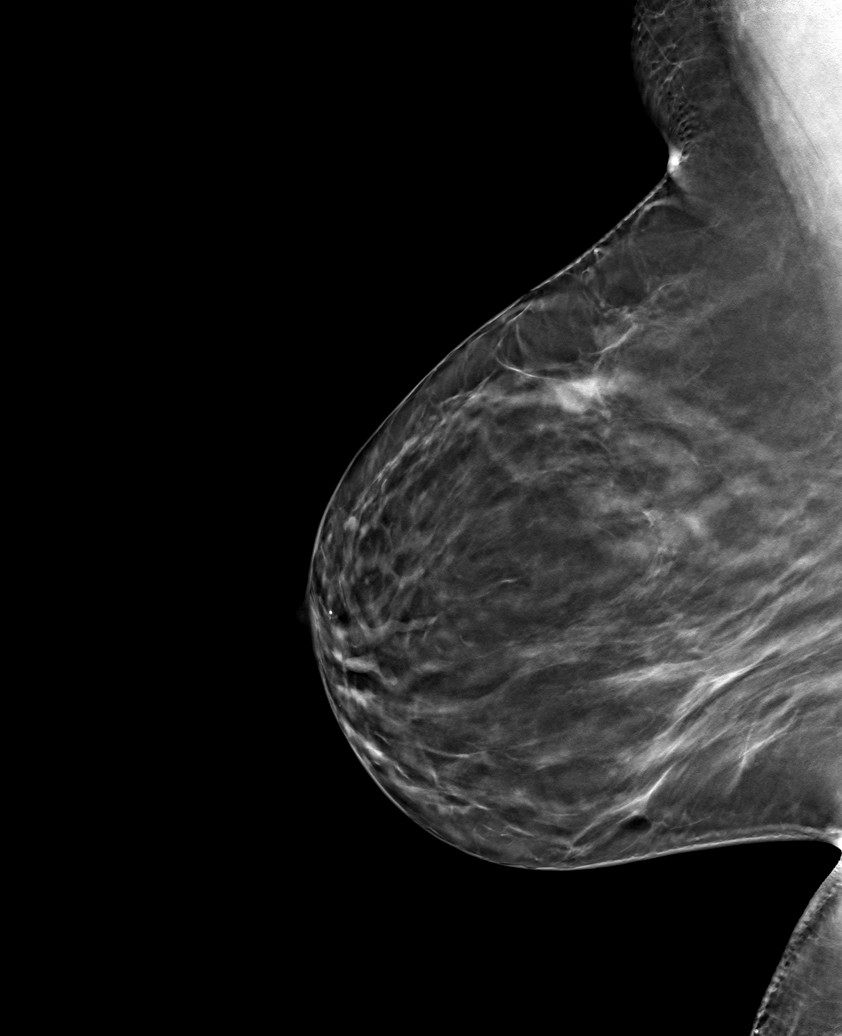

[6 of 30 positions shown; findings below may reference images not displayed]

ACR Breast Density Category b: There are scattered areas of
fibroglandular density.
FINDINGS: There are no findings suspicious for malignancy.
IMPRESSION: No mammographic evidence of malignancy. A result letter of this
screening mammogram will be mailed directly to the patient.

RECOMMENDATION:
Screening mammogram in one year. (Code:XG-X-X7B)

BI-RADS CATEGORY  1: Negative.

## 2022-05-25 ENCOUNTER — Encounter: Payer: Self-pay | Admitting: Cardiovascular Disease

## 2022-06-08 ENCOUNTER — Ambulatory Visit: Payer: Federal, State, Local not specified - PPO | Admitting: Dermatology

## 2022-06-18 ENCOUNTER — Telehealth: Payer: Self-pay | Admitting: Gastroenterology

## 2022-06-18 NOTE — Telephone Encounter (Signed)
Medical records were sent  from 05/2019 to present to [place] [facility] [place] mailed out on 06/18/2022

## 2022-08-23 ENCOUNTER — Other Ambulatory Visit: Payer: Self-pay | Admitting: Neurology

## 2022-08-23 ENCOUNTER — Encounter: Payer: Self-pay | Admitting: Neurology

## 2022-08-24 ENCOUNTER — Encounter: Payer: Self-pay | Admitting: Psychiatry

## 2022-08-24 ENCOUNTER — Ambulatory Visit: Payer: Federal, State, Local not specified - PPO | Admitting: Psychiatry

## 2022-08-24 VITALS — BP 144/82 | HR 82 | Ht 65.0 in | Wt 222.8 lb

## 2022-08-24 DIAGNOSIS — G935 Compression of brain: Secondary | ICD-10-CM

## 2022-08-24 DIAGNOSIS — R202 Paresthesia of skin: Secondary | ICD-10-CM

## 2022-08-24 DIAGNOSIS — G950 Syringomyelia and syringobulbia: Secondary | ICD-10-CM | POA: Diagnosis not present

## 2022-08-24 MED ORDER — LORAZEPAM 0.5 MG PO TABS: ORAL_TABLET | ORAL | 0 refills | Status: AC

## 2022-08-24 NOTE — Progress Notes (Signed)
[facility]  PATIENT: Virginia Woods DOB: July 28, 1971  REFERRING CLINICIAN: Charlane Ferretti, MD HISTORY FROM: self REASON FOR VISIT: Numbness, History of Chiari Malformation   HISTORICAL  CHIEF COMPLAINT:  Chief Complaint  Patient presents with   Follow-up    Pt alone, rm 1, Patient is complaining of neck pain for years, but now having generalized pain and more numbness all over for approximately 6 months. She states she has been tested to rule out autoimmune diseases and results were negative. Patient states massaging helps but it hurts, but has also been diagnosed with fibromyalgia and going up and down stairs and sitting is more painful than it used to be, pt has stated pain is now every day rather than occasional. Patient denies any recent imaging.    HISTORY OF PRESENT ILLNESS:  The patient presents for evaluation of numbness and body pain with history of Chiari malformation and syrinx. She was underwent decompression for Chiari malformation in 2011. This was complicated by CSF leak which required surgical repair. Last MRI brain was in 2013, which showed post-operative changes and was otherwise unremarkable. She had a syrinx from C4-5 to C7 on MRI C-spine in 2011. MRI C-spine in 2013 showed resolved syrinx with generalized volume loss from C5-T1.  Since the surgery she reports intermittent numbness in her arms. States she will wake up and her arms are numb. Feels weak when lifting bilateral arms above her shoulders. Has had chronic neck pain since the surgery, which feels like burning down her neck and shoulders. Over the past month she has also developed pain over her entire body. Describes this as aching and burning pain. It is particularly painful in the bottom of her feet. Light touch is painful for her as well. She also notes ongoing fatigue and brain fog, which is worse when she is anxious. She also reports intermittent occipital headaches and paresthesias,  but states these are different from her Chiari headaches and are not triggered by valsalva.  She also reports dysphagia which has been present for the past 2 years. Has followed with GI for this. Had a laryngoscopy which was suggestive of reflux. Also had a barium swallow that showed a filling defect in the left side of the cervical esophagus. CT soft-tissue of the neck was normal.   OTHER MEDICAL CONDITIONS: fibromyalgia, OSA, diverticulosis, anxiety, prediabetes, depression, anxiety, SVT, iron deficiency anemia, Chiari malformation s/p decompression 2011   REVIEW OF SYSTEMS: Full 14 system review of systems performed and negative with exception of: paresthesias, body pain, numbness  ALLERGIES: Allergies  Allergen Reactions   Dust Mite Extract Anaphylaxis   Codeine Nausea And Vomiting   Molds & Smuts    Doxycycline Itching and Rash   Soy Allergy Other (See Comments)   Tamiflu [Oseltamivir Phosphate] Rash    HOME MEDICATIONS: Outpatient Medications Prior to Visit  Medication Sig Dispense Refill   albuterol (VENTOLIN HFA) 108 (90 Base) MCG/ACT inhaler Inhale 2 puffs into the lungs every 6 (six) hours as needed for wheezing or shortness of breath. 18 g 3   ALPRAZolam (XANAX) 0.5 MG tablet Take 1 tablet (0.5 mg total) by mouth 2 (two) times daily as needed. 60 tablet 0   Ascorbic Acid (VITAMIN C PO) Take by mouth.     Cyanocobalamin (VITAMIN B 12 PO) Take by mouth.     cyclobenzaprine (FLEXERIL) 5 MG tablet Take 1 tablet (5 mg total) by mouth 3 (three) times daily as needed for muscle spasms. [employer]  tablet 5   diphenhydrAMINE (BENADRYL) 25 MG tablet Take 25 mg by mouth at bedtime as needed for sleep.     fexofenadine (ALLEGRA) 180 MG tablet Take 180 mg by mouth daily.     gabapentin (NEURONTIN) 300 MG capsule Take 1 capsule (300 mg total) by mouth 2 (two) times daily. 60 capsule 0   levocetirizine (XYZAL) 5 MG tablet Take 1 tablet (5 mg total) by mouth every evening. 90 tablet 0    loratadine (CLARITIN) 10 MG tablet Take 10 mg by mouth daily as needed.     metoprolol succinate (TOPROL-XL) 25 MG 24 hr tablet Take 1 tablet (25 mg total) by mouth daily. 90 tablet 3   mometasone (ELOCON) 0.1 % cream Apply once a day for 5 days a week for at least 1 month to affected area 30 g 1   nystatin (MYCOSTATIN/NYSTOP) powder Apply 1 application topically 3 (three) times daily. 15 g 0   OMEGA-3 FATTY ACIDS PO      propranolol (INDERAL) 10 MG tablet TAKE 1 TABLET (10 MG TOTAL) BY MOUTH 4 (FOUR) TIMES DAILY AS NEEDED (FAST HEART RATE/PALPITATIONS). Please make overdue appt with Dr. Acie Fredrickson. 3rd and Final Attempt 30 tablet 0   sodium bicarbonate 325 MG tablet Take 1 tablet (325 mg total) by mouth 3 (three) times daily as needed for heartburn. 90 tablet 0   triamcinolone (KENALOG) 0.1 % Apply 1 application topically 2 (two) times daily. 30 g 0   Vitamin D, Ergocalciferol, (DRISDOL) 1.25 MG (50000 UNIT) CAPS capsule TAKE ONE CAPSULE BY MOUTH EVERY 7 DAYS 12 capsule 1   zolpidem (AMBIEN) 5 MG tablet Take 0.5-1 tablets (2.5-5 mg total) by mouth at bedtime as needed for sleep (do not take within 6 hours of xanax.). 30 tablet 0   No facility-administered medications prior to visit.    PAST MEDICAL HISTORY: Past Medical History:  Diagnosis Date   Anemia    Anxiety    Complication of anesthesia    pt reports some complication after 2nd brain surgery, but unsure of what it was.   Dental bridge present    Perrmanent dental retainer - bottom   Depression    Difficult intubation    with 2nd chiari surgery (per pt)   Family history of adverse reaction to anesthesia    reports mother had a "negative reaction" to "anesthesia or lidocaine"   Headache    History of Chiari malformation 2011   2 surgeries 2011 - [facility]   Vitamin B12 deficiency    Vitamin D deficiency    Wears contact lenses     PAST SURGICAL HISTORY: Past Surgical History:  Procedure Laterality Date   BRAIN SURGERY  2011   2  surgeries for chiari malformation - [place]   COLONOSCOPY  2014   COLONOSCOPY WITH PROPOFOL N/A 06/20/2019   Procedure: COLONOSCOPY WITH PROPOFOL;  Surgeon: Lin Landsman, MD;  Location: [facility];  Service: Endoscopy;  Laterality: N/A;   ESOPHAGOGASTRODUODENOSCOPY (EGD) WITH PROPOFOL N/A 06/20/2019   Procedure: ESOPHAGOGASTRODUODENOSCOPY (EGD) WITH PROPOFOL;  Surgeon: Lin Landsman, MD;  Location: [place];  Service: Endoscopy;  Laterality: N/A;   TUBAL LIGATION  2002    FAMILY HISTORY: Family History  Problem Relation Age of Onset   Hypertension Mother    Cancer Mother        Laryngeal and lymphoma   Hypertension Sister    Heart disease Maternal Grandmother  SOCIAL HISTORY: Social History   Socioeconomic History   Marital status: Married    Spouse name: Not on file   Number of children: Not on file   Years of education: Not on file   Highest education level: Not on file  Occupational History   Not on file  Tobacco Use   Smoking status: Never   Smokeless tobacco: Never  Vaping Use   Vaping Use: Never used  Substance and Sexual Activity   Alcohol use: Yes    Comment: 1-2 glasses wine per month   Drug use: No   Sexual activity: Not on file  Other Topics Concern   Not on file  Social History Narrative   Not on file   Social Determinants of Health   Financial Resource Strain: Not on file  Food Insecurity: Not on file  Transportation Needs: Not on file  Physical Activity: Not on file  Stress: Not on file  Social Connections: Not on file  Intimate Partner Violence: Not on file     PHYSICAL EXAM  GENERAL EXAM/CONSTITUTIONAL: Vitals:  Vitals:   08/24/22 0927  BP: (!) 144/82  Pulse: 82  Weight: 222 lb 12.8 oz (101.1 kg)  Height: '5\' 5"'$  (1.651 m)   Body mass index is 37.08 kg/m. Wt Readings from Last 3 Encounters:  08/24/22 222 lb 12.8 oz (101.1 kg)  12/29/21 221 lb 3.2 oz (100.3 kg)  11/30/21 218  lb 4 oz (99 kg)    NEUROLOGIC: MENTAL STATUS:  awake, alert, oriented to person, place and time recent and remote memory intact normal attention and concentration language fluent, comprehension intact, naming intact fund of knowledge appropriate  CRANIAL NERVE:  2nd, 3rd, 4th, 6th - pupils equal and reactive to light, visual fields full to confrontation, extraocular muscles intact, no nystagmus 5th - facial sensation symmetric 7th - facial strength symmetric 8th - hearing intact 9th - palate elevates symmetrically, uvula midline 11th - shoulder shrug symmetric 12th - tongue protrusion midline  MOTOR:  4/5 abduction bilaterally, otherwise 5/5 throughout  SENSORY:  normal and symmetric to light touch and pinprick throughout. Decreased vibration left foot, otherwise vibration intact throughout  COORDINATION:  finger-nose-finger intact bilaterally  REFLEXES:  Brisk right biceps reflex without spread, otherwise reflexes are symmetric  GAIT/STATION:  Antalgic gait     DIAGNOSTIC DATA (LABS, IMAGING, TESTING) - I reviewed patient records, labs, notes, testing and imaging myself where available.  Lab Results  Component Value Date   WBC 6.4 01/26/2022   HGB 13.5 01/26/2022   HCT 43.3 01/26/2022   MCV 77.0 (L) 01/26/2022   PLT 307 01/26/2022      Component Value Date/Time   NA 143 11/30/2021 1347   K 4.5 11/30/2021 1347   CL 104 11/30/2021 1347   CO2 25 11/30/2021 1347   GLUCOSE 87 11/30/2021 1347   GLUCOSE 83 01/04/2016 1442   BUN 5 (L) 11/30/2021 1347   CREATININE 0.76 11/30/2021 1347   CALCIUM 9.7 11/30/2021 1347   PROT 6.9 11/30/2021 1347   ALBUMIN 4.3 11/30/2021 1347   AST 17 11/30/2021 1347   ALT 14 11/30/2021 1347   ALKPHOS 86 11/30/2021 1347   BILITOT 0.3 11/30/2021 1347   GFRNONAA 87 03/20/2020 1523   GFRAA 100 03/20/2020 1523   Lab Results  Component Value Date   CHOL 155 12/29/2021   HDL 55.70 12/29/2021   LDLCALC 79 12/29/2021   TRIG 99.0  12/29/2021   CHOLHDL 3 12/29/2021   Lab Results  Component Value  Date   HGBA1C 5.8 12/29/2021   Lab Results  Component Value Date   CVELFYBO17 510 12/29/2021   Lab Results  Component Value Date   TSH 1.25 12/29/2021      ASSESSMENT AND PLAN  51 y.o. year old female with a history of fibromyalgia, OSA, diverticulosis, anxiety, prediabetes, depression, anxiety, SVT, iron deficiency anemia, Chiari malformation s/p decompression 2011 who presents for evaluation of paresthesias and body pain. Will obtain updated MRI brain and C-spine to assess for status of Chiari malformation and syrinx given her onset of new symptoms. Her last A1c was consistent with prediabetes, which can cause burning pain and neuropathy. If testing is normal may consider EMG as she does report burning pain and decreased vibration in her left foot. Discussed increasing her Cymbalta to help with her body pain, and she will plan to discuss this with her PCP.   1. Chiari malformation type I ([place])   2. Syrinx ([place])       PLAN: -MRI brain and C-spine -Next steps: consider EMG if imaging is normal  Orders Placed This Encounter  Procedures   MR BRAIN W WO CONTRAST   MR CERVICAL SPINE W WO CONTRAST    Meds ordered this encounter  Medications   LORazepam (ATIVAN) 0.5 MG tablet    Sig: Take 1-2 pills 30 minutes prior to MRI    Dispense:  4 tablet    Refill:  0    Return in about 6 months (around 02/23/2023).    Genia Harold, MD 08/24/22 10:12 AM  I spent an average of 50 minutes chart reviewing and counseling the patient, with at least 50% of the time face to face with the patient.   [facility] [address] [place], [place] [address] [phone]

## 2022-08-25 ENCOUNTER — Telehealth: Payer: Self-pay | Admitting: Psychiatry

## 2022-08-25 NOTE — Telephone Encounter (Signed)
MRI orders sent to [facility] for open MRI.  BC[employer] federal: [employer]

## 2022-09-01 ENCOUNTER — Other Ambulatory Visit: Payer: Self-pay | Admitting: Internal Medicine

## 2022-09-01 DIAGNOSIS — Z1231 Encounter for screening mammogram for malignant neoplasm of breast: Secondary | ICD-10-CM

## 2022-09-09 ENCOUNTER — Other Ambulatory Visit: Payer: Self-pay | Admitting: Internal Medicine

## 2022-09-09 DIAGNOSIS — M5413 Radiculopathy, cervicothoracic region: Secondary | ICD-10-CM

## 2022-09-09 NOTE — Telephone Encounter (Signed)
Ok to PCP please 

## 2022-09-27 ENCOUNTER — Telehealth: Payer: Self-pay | Admitting: Psychiatry

## 2022-09-27 NOTE — Telephone Encounter (Signed)
Pt is asking for a call with results to MRI

## 2022-09-27 NOTE — Telephone Encounter (Signed)
Contacted pt back, informed her we have not received results yet. MRI was recently completed Friday, will contact them to see if they can send results.

## 2022-09-28 ENCOUNTER — Telehealth: Payer: Self-pay | Admitting: Psychiatry

## 2022-09-28 NOTE — Telephone Encounter (Signed)
Pt called wanting to know if her MRI will be ready today. Please advise.

## 2022-09-28 NOTE — Telephone Encounter (Signed)
There is no mass and there are no concerns for MS. The white matter changes mentioned are minimal and are consistent with normal age-related changes. There is nothing on the MRI that would cause her symptoms

## 2022-09-28 NOTE — Telephone Encounter (Addendum)
From TE 09/28/22 Genia Harold, MD   09/28/22 12:30 PM Note Called patient to discuss MRI results. Advised that MRI was stable with no acute process. All questions answered.   Anderson Malta Chima 09/28/22 12:30 PM       Dr Billey Gosling stated She never got a call to schedule her MRI C-spine, is there a number we can give her to schedule?  Both MRI were sent to [facility], I contacted them and spoke to [facility], she stated they have the orders. Someone missed the second one, she is contacting patient now to schedule.

## 2022-09-28 NOTE — Telephone Encounter (Addendum)
Contacted pt back, went over MD results. She stated she had a lot of questions regarding the impression she read on the MRI report Some concerns with things regarding noted T2 flair, white matter, MS concerns?, mass. She questioned if any of these things are causing her symptoms. She requested for MD to discuss results with her personally. Did advise pt MD is seeing pts during the day, if she calls it would be after she she's office visits. She verbally understood.

## 2022-09-28 NOTE — Telephone Encounter (Signed)
MRI results placed on MD desk for review

## 2022-09-28 NOTE — Telephone Encounter (Signed)
Called patient to discuss MRI results. Advised that MRI was stable with no acute process. All questions answered.  Virginia Woods 09/28/22 12:30 PM

## 2022-09-28 NOTE — Telephone Encounter (Signed)
Contacted pt back, informed her we got the results faxed to [place] yesterday. MD will review today as she was out yesterday.

## 2022-09-28 NOTE — Telephone Encounter (Signed)
Brain MRI is stable with no changes from her previous MRI. It just shows stable post-operative changes from her prior Chiari decompression

## 2022-10-08 ENCOUNTER — Other Ambulatory Visit: Payer: Self-pay | Admitting: Psychiatry

## 2022-10-14 ENCOUNTER — Telehealth: Payer: Self-pay | Admitting: *Deleted

## 2022-10-14 DIAGNOSIS — R202 Paresthesia of skin: Secondary | ICD-10-CM

## 2022-10-18 NOTE — Telephone Encounter (Signed)
Very small syrix that has not changed from last MRI. Looks like she has arthritic changes (not uncommon at this age) but may have a pinched nerve on the left that, if it were symptomatic, would shoot down her left arm possibly to the first few fingers. But this does not match her symptoms from reading Dr. Georgina Peer note so I do not believe her cervical spine has etiology to explain her generalized numbness "all over" and bilateral arm weakness and body pain or pain in her feet but may explain her neck pain due to the arthritic changes. Overall no significant changes from last MRI in 2018 as well so if these are new symptoms wouldn;t make sense it is coming from the neck if nothing has changed thanks.

## 2022-10-19 NOTE — Addendum Note (Signed)
Addended by: Genia Harold on: 10/19/2022 12:30 PM   Modules accepted: Orders

## 2022-10-19 NOTE — Telephone Encounter (Signed)
MRI is stable with no new findings to explain her symptoms. I've placed an order for an EMG to assess the nerves in her hands and feet for other conditions that can cause her symptoms, such as neuropathy

## 2022-10-19 NOTE — Telephone Encounter (Signed)
Can you please call pt to schedule EMG/NCS? Thank you!  Called pt an relayed message from Dr. Billey Gosling. Pt verbalized understanding and appreciation. Aware someone from our office will call to get her scheduled for EMG/NCS

## 2022-10-19 NOTE — Telephone Encounter (Signed)
Scheduled NCS/EMG with Dr. Jaynee Eagles for 12/09/2022 at 9:30 am.

## 2022-10-20 ENCOUNTER — Ambulatory Visit
Admission: RE | Admit: 2022-10-20 | Discharge: 2022-10-20 | Disposition: A | Discharge status: Home / Self Care | Payer: Federal, State, Local not specified - PPO | Source: Ambulatory Visit | Attending: Internal Medicine | Admitting: Internal Medicine

## 2022-10-20 DIAGNOSIS — Z1231 Encounter for screening mammogram for malignant neoplasm of breast: Secondary | ICD-10-CM

## 2022-12-09 ENCOUNTER — Ambulatory Visit: Payer: Federal, State, Local not specified - PPO | Admitting: Neurology

## 2022-12-09 VITALS — BP 130/83 | HR 74 | Ht 65.0 in | Wt 222.0 lb

## 2022-12-09 DIAGNOSIS — M542 Cervicalgia: Secondary | ICD-10-CM | POA: Diagnosis not present

## 2022-12-09 DIAGNOSIS — M791 Myalgia, unspecified site: Secondary | ICD-10-CM | POA: Diagnosis not present

## 2022-12-09 DIAGNOSIS — R202 Paresthesia of skin: Secondary | ICD-10-CM | POA: Diagnosis not present

## 2022-12-09 NOTE — Progress Notes (Signed)
History: Obtained from patient today. Pain and numbness throughout the body. Not in the face. In the hands and arms and legs. From the neck down. Under a lot of stress. Also +ANA. She is worried about rheumatologic disorders. No sensory changes in the face. She is very fatigued. She has body pain more muscle pain on exertional uppers and lowers but lowers are worse. Not daily some days she is fine some days worse than others. Both sides are symmetrical. NO radicular symptoms but has neck pain more musculoskeletal (MRI c-spine was unremarkable as was MRI brain). Cannot needle cervical paraspinals as they are unreliable after surgery. No lumbar radiculopathy or cervical radiculopathy symptoms. No back pain. Limited exam showed normal strength and AJs, patellars, biceps 2+.    I discussed normal emg/ncs with patient and her symptoms that do not appear to be neurologic after extensive evaluation. I reviewed all imaging and outside labs and reports available. MRI brain and c-spine without etiology. My recommendations dicussed as follows:   - Even though Dr. Estanislado Pandy declined Rheumatology referral , suggest discuss sending rheumatology referral to [facility] instead for +ANA. as clinically warranted by primary care  - For neck pain, Dr. Gardenia Phlegm at [place], I will place order, several of my patient have reported great results from Dr. Tamala Julian for their myofascial neck pain and highly recommended him - also consider PT ( CONSIDER Physical Therapy:  Cervical myofascial pain,  forward posture contributing to cervicalgia. Please evaluate and treat including dry needling, stretching, strengthening, manual therapy/massage, heating, TENS unit, exercising for scapular stabilization, pectoral stretching and rhomboid strengthening as clinically warranted as well as any other modality as recommended by evaluation.) She would like to try sports medicine at this time  10/11/2022: MRI cpsine and  on 09/24/2022 had MRi brain:  IMPRESSION:  1. No acute process or significant interval change.  2.  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  3.  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 No abnormal enhancement of the spinal cord.   IMPRESSION:  1. No acute intracranial abnormality.  2.  Chiari I malformation with suboccipital decompression.   Orders Placed This Encounter  Procedures   AMB referral to sports medicine    I spent over 30 minutes of face-to-face and non-face-to-face time with patient on the  1. Myofascial neck pain   2. Paresthesias   3. Muscle pain    diagnosis.  This included previsit chart review, lab review, study review, order entry, electronic health record documentation, patient education on the different diagnostic and therapeutic options, counseling and coordination of care, risks and benefits of management, compliance, or risk factor reduction. This does not include time spent on emg/ncs

## 2022-12-09 NOTE — Patient Instructions (Addendum)
Even though Dr. Estanislado Pandy declined, suggest discuss sending rheumatology referral to [facility] instead for +ANA. as clinically warranted by primary care For neck pain, Dr. Gardenia Phlegm at [place]

## 2022-12-10 ENCOUNTER — Telehealth: Payer: Self-pay | Admitting: Psychiatry

## 2022-12-10 DIAGNOSIS — M2550 Pain in unspecified joint: Secondary | ICD-10-CM

## 2022-12-10 NOTE — Telephone Encounter (Signed)
Pt called. Stated she would like for Dr. Billey Gosling to refer her to a rheumatology ( Dr. Devra Dopp) [facility] in [place].

## 2022-12-13 ENCOUNTER — Telehealth: Payer: Self-pay | Admitting: Psychiatry

## 2022-12-13 NOTE — Telephone Encounter (Signed)
I sent a Rheumatology referral for her, thanks

## 2022-12-13 NOTE — Telephone Encounter (Signed)
Called pt to find out which Rheumatologist she wanted a referral to be sent to. Pt states that she would like [facility].

## 2022-12-13 NOTE — Procedures (Signed)
Full Name: Virginia Woods Gender: Female MRN #: [id number] Date of Birth: 19-Jun-1971    Visit Date: 12/09/2022 08:06 Age: 52 Years Examining Physician: Dr. Sarina Ill Referring Physician: Dr. Genia Harold Height: 5 feet 5 inch    History:  Obtained from patient today. Pain and numbness throughout the body. Not in the face. In the hands and arms and legs. From the neck down. Under a lot of stress. Also +ANA. She is worried about rheumatologic disorders. No sensory changes in the face. She is very fatigued. She has body pain more muscle pain on exertional uppers and lowers but lowers are worse. Not daily some days she is fine some days worse than others. Both sides are symmetrical. NO radicular symptoms but has neck pain more musculoskeletal (MRI c-spine was unremarkable as was MRI brain). Cannot needle cervical paraspinals as they are unreliable after surgery. No lumbar radiculopathy or cervical radiculopathy symptoms. No back pain. Limited exam showed normal strength and AJs, patellars, biceps 2+.   Summery: EMG/NCS was performed on the left upper and left lower extremities. All nerves and muscles (as indicated in the following tables) were within normal limits.    Conclusion: This is a normal study of the left upper and left lower extremites. No evidence for polyneuropathy, mononeuropathy, radiculopathy or neurogenic muscle disease.  ------------------------------- Sarina Ill, M.D.  [facility] [address] [place], [place] [address] Tel: [phone] Fax: [phone]  Verbal informed consent was obtained from the patient, patient was informed of potential risk of procedure, including bruising, bleeding, hematoma formation, infection, muscle weakness, muscle pain, numbness, among others.        [place]    Nerve / Sites Muscle Latency Ref. Amplitude Ref. Rel Amp Segments Distance Velocity Ref. Area    ms ms mV mV %  cm m/s m/s mVms  L Median - APB      Wrist APB 3.1 ?4.4 11.9 ?4.0 100 Wrist - APB 7   42.8     Upper arm APB 7.4  11.9  100 Upper arm - Wrist 25 59 ?49 42.6  L Ulnar - ADM     Wrist ADM 2.5 ?3.3 12.7 ?6.0 100 Wrist - ADM 7   32.5     B.Elbow ADM 4.4  11.4  89.5 B.Elbow - Wrist 12 65 ?49 30.9     A.Elbow ADM 7.1  10.6  93.4 A.Elbow - B.Elbow 17 61 ?49 30.8         SNC    Nerve / Sites Rec. Site Peak Lat Ref.  Amp Ref. Segments Distance    ms ms V V  cm  L Radial - Anatomical snuff box (Forearm)     Forearm Wrist 2.3 ?2.9 40 ?15 Forearm - Wrist 10  L Sural - Ankle (Calf)     Calf Ankle 3.0 ?4.4 13 ?6 Calf - Ankle 14  L Superficial peroneal - Ankle     Lat leg Ankle 3.7 ?4.4 9 ?6 Lat leg - Ankle 14  L Median - Orthodromic (Dig II, Mid palm)     Dig II Wrist 3.0 ?3.4 14 ?10 Dig II - Wrist 13  L Ulnar - Orthodromic, (Dig V, Mid palm)     Dig V Wrist 2.9 ?3.1 12 ?5 Dig V - Wrist 53               F  Wave    Nerve F Lat Ref.   ms ms  L Ulnar - ADM 27.9 ?32.0       L. Triceps brachii Normal None None None _______ Normal Normal Normal Normal  L. First dorsal interosseous Normal None None None _______ Normal Normal Normal Normal  L. Flexor digitorum profundus (Ulnar) Decreased None None None _______ Normal Normal Normal Normal  L. Pronator teres Normal None None None _______ Normal Normal Normal Normal  L. Extensor indicis proprius Normal None None None _______ Normal Normal Normal Normal  L. Opponens pollicis Normal None None None _______ Normal Normal Normal Normal  L. Extensor Digitorum Communis Normal None None None _______ Normal Normal Normal Normal  L.Deltoid Normal None None None _______ Normal Normal Normal Normal      L. Vastus Medialis Normal None None None _______ Normal Normal Normal Normal  L Tibial Anterior Normal None None None _______ Normal Normal Normal Normal  L Medial Gastroc Decreased None None None _______ Normal Normal Normal Normal  L Extensor Hallucise Normal None None None _______ Normal  Normal Normal Normal  L iliopsoas Normal None None None _______ Normal Normal Normal Normal  L abductpr Pollicis Normal None None None _______ Normal Normal Normal Normal

## 2022-12-13 NOTE — Telephone Encounter (Signed)
Dr. Billey Gosling, are you agreeable to this?

## 2022-12-13 NOTE — Telephone Encounter (Signed)
LVM for pt letting her know Rheumatology referral placed. Advised her to call back if any questions.

## 2022-12-13 NOTE — Telephone Encounter (Signed)
Pt called wanting to know if she can get a referral to a rheumatologist. Please call to advise.

## 2022-12-13 NOTE — Addendum Note (Signed)
Addended by: Genia Harold on: 12/13/2022 09:41 AM   Modules accepted: Orders

## 2022-12-13 NOTE — Progress Notes (Signed)
Full Name: Virginia Woods Gender: Female MRN #: [id number] Date of Birth: 05-29-1971    Visit Date: 12/09/2022 08:06 Age: 52 Years Examining Physician: Dr. Sarina Ill Referring Physician: Dr. Genia Harold Height: 5 feet 5 inch    History:  Obtained from patient today. Pain and numbness throughout the body. Not in the face. In the hands and arms and legs. From the neck down. Under a lot of stress. Also +ANA. She is worried about rheumatologic disorders. No sensory changes in the face. She is very fatigued. She has body pain more muscle pain on exertional uppers and lowers but lowers are worse. Not daily some days she is fine some days worse than others. Both sides are symmetrical. NO radicular symptoms but has neck pain more musculoskeletal (MRI c-spine was unremarkable as was MRI brain). Cannot needle cervical paraspinals as they are unreliable after surgery. No lumbar radiculopathy or cervical radiculopathy symptoms. No back pain. Limited exam showed normal strength and AJs, patellars, biceps 2+.   Summery: EMG/NCS was performed on the left upper and left lower extremities. All nerves and muscles (as indicated in the following tables) were within normal limits.    Conclusion: This is a normal study of the left upper and left lower extremites. No evidence for polyneuropathy, mononeuropathy, radiculopathy or neurogenic muscle disease.  ------------------------------- Sarina Ill, M.D.  [facility] [address] [place], [place] [address] Tel: [phone] Fax: [phone]  Verbal informed consent was obtained from the patient, patient was informed of potential risk of procedure, including bruising, bleeding, hematoma formation, infection, muscle weakness, muscle pain, numbness, among others.        [place]    Nerve / Sites Muscle Latency Ref. Amplitude Ref. Rel Amp Segments Distance Velocity Ref. Area    ms ms mV mV %  cm m/s m/s mVms  L Median - APB      Wrist APB 3.1 ?4.4 11.9 ?4.0 100 Wrist - APB 7   42.8     Upper arm APB 7.4  11.9  100 Upper arm - Wrist 25 59 ?49 42.6  L Ulnar - ADM     Wrist ADM 2.5 ?3.3 12.7 ?6.0 100 Wrist - ADM 7   32.5     B.Elbow ADM 4.4  11.4  89.5 B.Elbow - Wrist 12 65 ?49 30.9     A.Elbow ADM 7.1  10.6  93.4 A.Elbow - B.Elbow 17 61 ?49 30.8         SNC    Nerve / Sites Rec. Site Peak Lat Ref.  Amp Ref. Segments Distance    ms ms V V  cm  L Radial - Anatomical snuff box (Forearm)     Forearm Wrist 2.3 ?2.9 40 ?15 Forearm - Wrist 10  L Sural - Ankle (Calf)     Calf Ankle 3.0 ?4.4 13 ?6 Calf - Ankle 14  L Superficial peroneal - Ankle     Lat leg Ankle 3.7 ?4.4 9 ?6 Lat leg - Ankle 14  L Median - Orthodromic (Dig II, Mid palm)     Dig II Wrist 3.0 ?3.4 14 ?10 Dig II - Wrist 13  L Ulnar - Orthodromic, (Dig V, Mid palm)     Dig V Wrist 2.9 ?3.1 12 ?5 Dig V - Wrist 4               F  Wave    Nerve F Lat Ref.   ms ms  L Ulnar - ADM 27.9 ?32.0       L. Triceps brachii Normal None None None _______ Normal Normal Normal Normal  L. First dorsal interosseous Normal None None None _______ Normal Normal Normal Normal  L. Flexor digitorum profundus (Ulnar) Decreased None None None _______ Normal Normal Normal Normal  L. Pronator teres Normal None None None _______ Normal Normal Normal Normal  L. Extensor indicis proprius Normal None None None _______ Normal Normal Normal Normal  L. Opponens pollicis Normal None None None _______ Normal Normal Normal Normal  L. Extensor Digitorum Communis Normal None None None _______ Normal Normal Normal Normal  L.Deltoid Normal None None None _______ Normal Normal Normal Normal      L. Vastus Medialis Normal None None None _______ Normal Normal Normal Normal  L Tibial Anterior Normal None None None _______ Normal Normal Normal Normal  L Medial Gastroc Decreased None None None _______ Normal Normal Normal Normal  L Extensor Hallucise Normal None None None _______ Normal  Normal Normal Normal  L iliopsoas Normal None None None _______ Normal Normal Normal Normal  L abductpr Pollicis Normal None None None _______ Normal Normal Normal Normal

## 2022-12-15 ENCOUNTER — Telehealth: Payer: Self-pay | Admitting: Psychiatry

## 2022-12-15 NOTE — Telephone Encounter (Signed)
Referral sent to Dr. Devra Dopp at [facility] [facility] in [place] [phone]

## 2023-01-24 ENCOUNTER — Telehealth: Payer: Self-pay | Admitting: Hematology and Oncology

## 2023-01-24 NOTE — Telephone Encounter (Signed)
Rescheduled appointment per provider [facility]. Left voicemail. 

## 2023-01-27 ENCOUNTER — Telehealth: Payer: Self-pay | Admitting: Hematology and Oncology

## 2023-01-31 ENCOUNTER — Other Ambulatory Visit: Payer: Federal, State, Local not specified - PPO

## 2023-02-02 ENCOUNTER — Ambulatory Visit: Payer: Federal, State, Local not specified - PPO | Admitting: Hematology and Oncology

## 2023-02-04 ENCOUNTER — Ambulatory Visit: Payer: Federal, State, Local not specified - PPO | Admitting: Hematology and Oncology

## 2023-02-18 ENCOUNTER — Other Ambulatory Visit: Payer: Self-pay

## 2023-02-18 DIAGNOSIS — D508 Other iron deficiency anemias: Secondary | ICD-10-CM

## 2023-02-18 NOTE — Progress Notes (Signed)
Cbc

## 2023-02-21 ENCOUNTER — Inpatient Hospital Stay: Payer: Federal, State, Local not specified - PPO | Attending: Hematology and Oncology

## 2023-02-21 ENCOUNTER — Encounter (HOSPITAL_COMMUNITY): Payer: Self-pay | Admitting: *Deleted

## 2023-02-21 ENCOUNTER — Other Ambulatory Visit: Payer: Self-pay | Admitting: Internal Medicine

## 2023-02-21 ENCOUNTER — Emergency Department (HOSPITAL_COMMUNITY)
Admission: EM | Admit: 2023-02-21 | Discharge: 2023-02-21 | Disposition: A | Discharge status: Home / Self Care | Payer: Federal, State, Local not specified - PPO | Attending: Emergency Medicine | Admitting: Emergency Medicine

## 2023-02-21 ENCOUNTER — Other Ambulatory Visit: Payer: Self-pay

## 2023-02-21 ENCOUNTER — Inpatient Hospital Stay: Payer: Federal, State, Local not specified - PPO

## 2023-02-21 DIAGNOSIS — X19XXXA Contact with other heat and hot substances, initial encounter: Secondary | ICD-10-CM | POA: Diagnosis not present

## 2023-02-21 DIAGNOSIS — M5413 Radiculopathy, cervicothoracic region: Secondary | ICD-10-CM

## 2023-02-21 DIAGNOSIS — D508 Other iron deficiency anemias: Secondary | ICD-10-CM

## 2023-02-21 DIAGNOSIS — T2122XA Burn of second degree of abdominal wall, initial encounter: Secondary | ICD-10-CM | POA: Diagnosis present

## 2023-02-21 DIAGNOSIS — T310 Burns involving less than 10% of body surface: Secondary | ICD-10-CM | POA: Insufficient documentation

## 2023-02-21 DIAGNOSIS — T300 Burn of unspecified body region, unspecified degree: Secondary | ICD-10-CM

## 2023-02-21 LAB — CBC WITH DIFFERENTIAL (CANCER CENTER ONLY)
Abs Immature Granulocytes: 0.02 10*3/uL (ref 0.00–0.07)
Basophils Absolute: 0 10*3/uL (ref 0.0–0.1)
Basophils Relative: 0 %
Eosinophils Absolute: 0.1 10*3/uL (ref 0.0–0.5)
Eosinophils Relative: 1 %
HCT: 43.7 % (ref 36.0–46.0)
Hemoglobin: 13.8 g/dL (ref 12.0–15.0)
Immature Granulocytes: 0 %
Lymphocytes Relative: 40 %
Lymphs Abs: 1.9 10*3/uL (ref 0.7–4.0)
MCH: 24.5 pg — ABNORMAL LOW (ref 26.0–34.0)
MCHC: 31.6 g/dL (ref 30.0–36.0)
MCV: 77.6 fL — ABNORMAL LOW (ref 80.0–100.0)
Monocytes Absolute: 0.4 10*3/uL (ref 0.1–1.0)
Monocytes Relative: 9 %
Neutro Abs: 2.3 10*3/uL (ref 1.7–7.7)
Neutrophils Relative %: 50 %
Platelet Count: 280 10*3/uL (ref 150–400)
RBC: 5.63 MIL/uL — ABNORMAL HIGH (ref 3.87–5.11)
RDW: 15 % (ref 11.5–15.5)
WBC Count: 4.7 10*3/uL (ref 4.0–10.5)
nRBC: 0 % (ref 0.0–0.2)

## 2023-02-21 LAB — IRON AND IRON BINDING CAPACITY (CC-WL,HP ONLY)
Iron: 107 ug/dL (ref 28–170)
Saturation Ratios: 26 % (ref 10.4–31.8)
TIBC: 420 ug/dL (ref 250–450)
UIBC: 313 ug/dL (ref 148–442)

## 2023-02-21 LAB — FERRITIN: Ferritin: 41 ng/mL (ref 11–307)

## 2023-02-21 MED ORDER — SILVER SULFADIAZINE 1 % EX CREA: 1.0000 | TOPICAL_CREAM | Freq: Two times a day (BID) | CUTANEOUS | 0 refills | Status: AC

## 2023-02-21 MED ORDER — SILVER SULFADIAZINE 1 % EX CREA: TOPICAL_CREAM | Freq: Two times a day (BID) | CUTANEOUS | Status: DC
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
## 2023-02-22 ENCOUNTER — Other Ambulatory Visit: Payer: Federal, State, Local not specified - PPO

## 2023-02-22 ENCOUNTER — Ambulatory Visit: Payer: Federal, State, Local not specified - PPO | Admitting: Dermatology

## 2023-02-22 VITALS — BP 112/78

## 2023-02-22 DIAGNOSIS — X19XXXA Contact with other heat and hot substances, initial encounter: Secondary | ICD-10-CM | POA: Diagnosis not present

## 2023-02-22 DIAGNOSIS — T2122XA Burn of second degree of abdominal wall, initial encounter: Secondary | ICD-10-CM

## 2023-02-22 DIAGNOSIS — T300 Burn of unspecified body region, unspecified degree: Secondary | ICD-10-CM

## 2023-02-22 NOTE — Patient Instructions (Signed)
Due to recent changes in healthcare laws, you may see results of your pathology and/or laboratory studies on MyChart before the doctors have had a chance to review them. We understand that in some cases there may be results that are confusing or concerning to you. Please understand that not all results are received at the same time and often the doctors may need to interpret multiple results in order to provide you with the best plan of care or course of treatment. Therefore, we ask that you please give us 2 business days to thoroughly review all your results before contacting the office for clarification. Should we see a critical lab result, you will be contacted sooner.   If You Need Anything After Your Visit  If you have any questions or concerns for your doctor, please call our main line at [phone] and press option 4 to reach your doctor's medical assistant. If no one answers, please leave a voicemail as directed and we will return your call as soon as possible. Messages left after 4 pm will be answered the following business day.   You may also send us a message via MyChart. We typically respond to MyChart messages within 1-2 business days.  For prescription refills, please ask your pharmacy to contact our office. Our fax number is [phone].  If you have an urgent issue when the clinic is closed that cannot wait until the next business day, you can page your doctor at the number below.    Please note that while we do our best to be available for urgent issues outside of office hours, we are not available 24/7.   If you have an urgent issue and are unable to reach us, you may choose to seek medical care at your doctor's office, retail clinic, urgent care center, or emergency room.  If you have a medical emergency, please immediately call 911 or go to the emergency department.  Pager Numbers  - Dr. Kowalski: [phone]  - Dr. Moye: [phone]  - Dr. Stewart:  [phone]  In the event of inclement weather, please call our main line at [phone] for an update on the status of any delays or closures.  Dermatology Medication Tips: Please keep the boxes that topical medications come in in order to help keep track of the instructions about where and how to use these. Pharmacies typically print the medication instructions only on the boxes and not directly on the medication tubes.   If your medication is too expensive, please contact our office at [phone] option 4 or send us a message through MyChart.   We are unable to tell what your co-pay for medications will be in advance as this is different depending on your insurance coverage. However, we may be able to find a substitute medication at lower cost or fill out paperwork to get insurance to cover a needed medication.   If a prior authorization is required to get your medication covered by your insurance company, please allow us 1-2 business days to complete this process.  Drug prices often vary depending on where the prescription is filled and some pharmacies may offer cheaper prices.  The website www.goodrx.com contains coupons for medications through different pharmacies. The prices here do not account for what the cost may be with help from insurance (it may be cheaper with your insurance), but the website can give you the price if you did not use any insurance.  - You can print the associated coupon and take it with   your prescription to the pharmacy.  - You may also stop by our office during regular business hours and pick up a GoodRx coupon card.  - If you need your prescription sent electronically to a different pharmacy, notify our office through [place] MyChart or by phone at [phone] option 4.     Si Usted Necesita Algo Despus de Su Visita  Tambin puede enviarnos un mensaje a travs de MyChart. Por lo general respondemos a los mensajes de MyChart en el transcurso de 1 a 2  das hbiles.  Para renovar recetas, por favor pida a su farmacia que se ponga en contacto con nuestra oficina. Nuestro nmero de fax es el [phone].  Si tiene un asunto urgente cuando la clnica est cerrada y que no puede esperar hasta el siguiente da hbil, puede llamar/localizar a su doctor(a) al nmero que aparece a continuacin.   Por favor, tenga en cuenta que aunque hacemos todo lo posible para estar disponibles para asuntos urgentes fuera del horario de oficina, no estamos disponibles las 24 horas del da, los 7 das de la semana.   Si tiene un problema urgente y no puede comunicarse con nosotros, puede optar por buscar atencin mdica  en el consultorio de su doctor(a), en una clnica privada, en un centro de atencin urgente o en una sala de emergencias.  Si tiene una emergencia mdica, por favor llame inmediatamente al 911 o vaya a la sala de emergencias.  Nmeros de bper  - Dr. Kowalski: [phone]  - Dra. Moye: [phone]  - Dra. Stewart: [phone]  En caso de inclemencias del tiempo, por favor llame a nuestra lnea principal al [phone] para una actualizacin sobre el estado de cualquier retraso o cierre.  Consejos para la medicacin en dermatologa: Por favor, guarde las cajas en las que vienen los medicamentos de uso tpico para ayudarle a seguir las instrucciones sobre dnde y cmo usarlos. Las farmacias generalmente imprimen las instrucciones del medicamento slo en las cajas y no directamente en los tubos del medicamento.   Si su medicamento es muy caro, por favor, pngase en contacto con nuestra oficina llamando al [phone] y presione la opcin 4 o envenos un mensaje a travs de MyChart.   No podemos decirle cul ser su copago por los medicamentos por adelantado ya que esto es diferente dependiendo de la cobertura de su seguro. Sin embargo, es posible que podamos encontrar un medicamento sustituto a menor costo o llenar un formulario para que el  seguro cubra el medicamento que se considera necesario.   Si se requiere una autorizacin previa para que su compaa de seguros cubra su medicamento, por favor permtanos de 1 a 2 das hbiles para completar este proceso.  Los precios de los medicamentos varan con frecuencia dependiendo del lugar de dnde se surte la receta y alguna farmacias pueden ofrecer precios ms baratos.  El sitio web www.goodrx.com tiene cupones para medicamentos de diferentes farmacias. Los precios aqu no tienen en cuenta lo que podra costar con la ayuda del seguro (puede ser ms barato con su seguro), pero el sitio web puede darle el precio si no utiliz ningn seguro.  - Puede imprimir el cupn correspondiente y llevarlo con su receta a la farmacia.  - Tambin puede pasar por nuestra oficina durante el horario de atencin regular y recoger una tarjeta de cupones de GoodRx.  - Si necesita que su receta se enve electrnicamente a una farmacia diferente, informe a nuestra oficina a travs de MyChart de [place]   o por telfono llamando al [phone] y presione la opcin 4.  

## 2023-02-22 NOTE — Progress Notes (Signed)
   Follow-Up Visit   Subjective  Virginia Woods is a 52 y.o. female who presents for the following: check burn L side, x 2 days, pt thinks she may have burned area from using a ice pack and heating pad on side due to having severe back pain from a recent injury bending over, started Silver sulfadiazine x 1 day   The following portions of the chart were reviewed this encounter and updated as appropriate: medications, allergies, medical history  Review of Systems:  No other skin or systemic complaints except as noted in HPI or Assessment and Plan.  Objective  Well appearing patient in no apparent distress; mood and affect are within normal limits.   A focused examination was performed of the following areas: L flank  Relevant exam findings are noted in the Assessment and Plan.    Assessment & Plan   THERMAL BURN 2nd degree, secondary to heating pad/ice pack Exam: hyperpigmented angular patches with scattered bullae L flank  Treatment Plan: Cont Rx Silver Sulfadiazine cr qd as directed Cont gently cleaning area qd with soap and water Once healed recommend spf 30+ to area or photoprotection if sun-exposed Once healed, hyperpigmentation will take time to fade     Return in about 1 month (around 03/24/2023), or if symptoms worsen or fail to improve, for burn f/u.  I, Ardis Rowan, RMA, am acting as scribe for Willeen Niece, MD .   Documentation: I have reviewed the above documentation for accuracy and completeness, and I agree with the above.  Willeen Niece, MD

## 2023-02-23 ENCOUNTER — Telehealth: Payer: Self-pay | Admitting: Hematology and Oncology

## 2023-02-23 NOTE — Telephone Encounter (Signed)
Rescheduled appointment per 6/5 staff message. Patient is aware of the changes made to her upcoming appointment.

## 2023-02-24 ENCOUNTER — Telehealth: Payer: Federal, State, Local not specified - PPO | Admitting: Hematology and Oncology

## 2023-03-07 ENCOUNTER — Ambulatory Visit: Payer: Federal, State, Local not specified - PPO | Admitting: Family Medicine

## 2023-03-16 ENCOUNTER — Other Ambulatory Visit: Payer: Self-pay | Admitting: Internal Medicine

## 2023-03-16 NOTE — Telephone Encounter (Signed)
To pcp please 

## 2023-03-19 NOTE — Progress Notes (Signed)
HEMATOLOGY-ONCOLOGY TELEPHONE VISIT PROGRESS NOTE  I connected with our patient on 03/21/23 at  1:45 PM EDT by telephone and verified that I am speaking with the correct person using two identifiers.  I discussed the limitations, risks, security and privacy concerns of performing an evaluation and management service by telephone and the availability of in person appointments.  I also discussed with the patient that there may be a patient responsible charge related to this service. The patient expressed understanding and agreed to proceed.   History of Present Illness: Virginia Woods is a 52 y.o. female with above-mentioned history of iron deficiency anemia. She presents via telephone today for follow-up to review labs. No further menstrual cycles.   REVIEW OF SYSTEMS:   Constitutional: Denies fevers, chills or abnormal weight loss All other systems were reviewed with the patient and are negative. Observations/Objective:   Assessment Plan:  Anemia, iron deficiency Lab review 2020: Iron saturation 7%, ferritin 13 2021: Iron saturation 7%, ferritin 23 01/05/2021: Iron saturation 8%, ferritin 13, TIBC 420, hemoglobin 11.5, MCV 73 07/24/2021: Hemoglobin 12.1, MCV 74 ferritin 26, iron saturation 16% 01/26/2022: Hemoglobin 13.5, MCV 77, iron saturation 18%, TIBC 451, ferritin 26 02/21/2023: Hb 13.8, MCV 77.6, Iron Sat: 26%, Ferritin 41   Iron deficiency with only mild anemia: Longstanding Differential diagnosis blood loss versus malabsorption (she had colonoscopy 2 years ago and that was negative for bleeding, she does have couple of days of heavy menstrual cycles every month.)   No role of IV iron at this time.    Return to clinic on an as needed basis.    I discussed the assessment and treatment plan with the patient. The patient was provided an opportunity to ask questions and all were answered. The patient agreed with the plan and demonstrated an understanding of the instructions. The  patient was advised to call back or seek an in-person evaluation if the symptoms worsen or if the condition fails to improve as anticipated.   I provided 12 minutes of non-face-to-face time during this encounter.  This includes time for charting and coordination of care   Tamsen Meek, MD  I Janan Ridge am acting as a scribe for Dr.Vinay Gudena  I have reviewed the above documentation for accuracy and completeness, and I agree with the above.

## 2023-03-21 ENCOUNTER — Inpatient Hospital Stay
Payer: Federal, State, Local not specified - PPO | Attending: Hematology and Oncology | Admitting: Hematology and Oncology

## 2023-03-21 DIAGNOSIS — D508 Other iron deficiency anemias: Secondary | ICD-10-CM | POA: Diagnosis not present

## 2023-03-21 NOTE — Assessment & Plan Note (Signed)
Lab review 2020: Iron saturation 7%, ferritin 13 2021: Iron saturation 7%, ferritin 23 01/05/2021: Iron saturation 8%, ferritin 13, TIBC 420, hemoglobin 11.5, MCV 73 07/24/2021: Hemoglobin 12.1, MCV 74 ferritin 26, iron saturation 16% 01/26/2022: Hemoglobin 13.5, MCV 77, iron saturation 18%, TIBC 451, ferritin 26 02/21/2023: Hb 13.8, MCV 77.6, Iron Sat: 26%, Ferritin 41   Iron deficiency with only mild anemia: Longstanding Differential diagnosis blood loss versus malabsorption (she had colonoscopy 2 years ago and that was negative for bleeding, she does have couple of days of heavy menstrual cycles every month.)   No role of IV iron at this time.    Return to clinic on an as needed basis.

## 2023-03-21 NOTE — Addendum Note (Signed)
Addended by: Serena Croissant on: 03/21/2023 01:55 PM   Modules accepted: Level of Service

## 2023-03-29 ENCOUNTER — Ambulatory Visit: Payer: Federal, State, Local not specified - PPO | Admitting: Dermatology

## 2023-03-29 VITALS — BP 116/78 | HR 69

## 2023-03-29 DIAGNOSIS — L853 Xerosis cutis: Secondary | ICD-10-CM | POA: Diagnosis not present

## 2023-03-29 DIAGNOSIS — L810 Postinflammatory hyperpigmentation: Secondary | ICD-10-CM

## 2023-03-29 DIAGNOSIS — T300 Burn of unspecified body region, unspecified degree: Secondary | ICD-10-CM

## 2023-03-29 DIAGNOSIS — X19XXXD Contact with other heat and hot substances, subsequent encounter: Secondary | ICD-10-CM

## 2023-03-29 DIAGNOSIS — L918 Other hypertrophic disorders of the skin: Secondary | ICD-10-CM

## 2023-03-29 DIAGNOSIS — T2122XD Burn of second degree of abdominal wall, subsequent encounter: Secondary | ICD-10-CM

## 2023-03-29 DIAGNOSIS — L299 Pruritus, unspecified: Secondary | ICD-10-CM

## 2023-03-29 NOTE — Progress Notes (Signed)
   Follow-Up Visit   Subjective  Virginia Woods is a 52 y.o. female who presents for the following: 1 month f/u on the left side, burn that appeared 1 month ago from using a heating pad or cold pack on her left side, treated with Silver Sulfadiazine cream with a good response. Patient c/o dry skin on her feet and elbows. Check a mole under her right arm, no symptoms.    The following portions of the chart were reviewed this encounter and updated as appropriate: medications, allergies, medical history  Review of Systems:  No other skin or systemic complaints except as noted in HPI or Assessment and Plan.  Objective  Well appearing patient in no apparent distress; mood and affect are within normal limits.  A focused examination was performed of the following areas: left side, feet, arms, elbows    Relevant exam findings are noted in the Assessment and Plan.    Assessment & Plan   THERMAL BURN- improved with post-inflammatory hyperpimentation (PIH)  2nd degree, secondary to heating pad/ice pack Exam: hyperpigmented angular patches with focal hypopigmentation, see photo   Treatment:  Use a good moisturizer daily and add sunscreen spf 30+ if exposed to sun In 2 months  Start lightening cream from [facility].  Hydroquinone 8%, tretinoin 0.025%, kojic acid 1%, niacinamide 4%, fluocinolone 0.025%.  Apply a small amount to the affected dark area qhs for 2-3 months then take break for for 2-3 months  Reviewed risk of permanent dark spots (ochronosis) if hydroquinone is used longer than 3 months.    XEROSIS WITH PRURITUS  Location- feet, elbows  Exam: dry skin with follicular prominence at arms/elbows and hyperkeratosis feet  Treatment Plan:  Recommend starting moisturizer with exfoliant (Urea, Salicylic acid, or Lactic acid) one to two times daily to help smooth rough and bumpy skin.  OTC options include Cetaphil Rough and Bumpy lotion (Urea), Eucerin Roughness Relief  lotion or spot treatment cream (Urea), CeraVe SA lotion/cream for Rough and Bumpy skin (Sal Acid), Gold Bond Rough and Bumpy cream (Sal Acid), and AmLactin 12% lotion/cream (Lactic Acid).  If applying in morning, also apply sunscreen to sun-exposed areas, since these exfoliating moisturizers can increase sensitivity to sun.   Discussed adding RX topical steroid cream to apply prn itch, but pt defers at this time  Acrochordons (Skin Tags) Right axilla  - Fleshy, skin-colored pedunculated papules - Benign appearing.  - Observe. - If desired, they can be removed with an in office procedure that is not covered by insurance. - Please call the clinic if you notice any new or changing lesions.    Return in about 6 months (around 09/29/2023) for Burn .  I, Angelique Holm, CMA, am acting as scribe for Willeen Niece, MD .   Documentation: I have reviewed the above documentation for accuracy and completeness, and I agree with the above.  Willeen Niece, MD

## 2023-03-29 NOTE — Patient Instructions (Addendum)
Instructions for [facility] Medications  One or more of your medications was sent to the [facility] mail order compounding pharmacy. You will receive an email from them and can purchase the medicine through that link. It will then be mailed to your home at the address you confirmed. If for any reason you do not receive an email from them, please check your spam folder. If you still do not find the email, please let us know. [facility] phone number is [phone].      Recommend daily broad spectrum sunscreen SPF 30+ to sun-exposed areas, reapply every 2 hours as needed. Call for new or changing lesions.    Staying in the shade or wearing long sleeves, sun glasses (UVA+UVB protection) and wide brim hats (4-inch brim around the entire circumference of the hat) are also recommended for sun protection.  starting moisturizer with exfoliant (Urea, Salicylic acid, or Lactic acid) one to two times daily to help smooth rough and bumpy skin.  OTC options include Cetaphil Rough and Bumpy lotion (Urea), Eucerin Roughness Relief lotion or spot treatment cream (Urea), CeraVe SA lotion/cream for Rough and Bumpy skin (Sal Acid), Gold Bond Rough and Bumpy cream (Sal Acid), and AmLactin 12% lotion/cream (Lactic Acid).  If applying in morning, also apply sunscreen to sun-exposed areas, since these exfoliating moisturizers can increase sensitivity to sun.     Due to recent changes in healthcare laws, you may see results of your pathology and/or laboratory studies on MyChart before the doctors have had a chance to review them. We understand that in some cases there may be results that are confusing or concerning to you. Please understand that not all results are received at the same time and often the doctors may need to interpret multiple results in order to provide you with the best plan of care or course of treatment. Therefore, we ask that you please give [place] 2 business days to thoroughly review all your  results before contacting the office for clarification. Should we see a critical lab result, you will be contacted sooner.   If You Need Anything After Your Visit  If you have any questions or concerns for your doctor, please call our main line at [phone] and press option 4 to reach your doctor's medical assistant. If no one answers, please leave a voicemail as directed and we will return your call as soon as possible. Messages left after 4 pm will be answered the following business day.   You may also send [place] a message via MyChart. We typically respond to MyChart messages within 1-2 business days.  For prescription refills, please ask your pharmacy to contact our office. Our fax number is [phone].  If you have an urgent issue when the clinic is closed that cannot wait until the next business day, you can page your doctor at the number below.    Please note that while we do our best to be available for urgent issues outside of office hours, we are not available 24/7.   If you have an urgent issue and are unable to reach [place], you may choose to seek medical care at your doctor's office, retail clinic, urgent care center, or emergency room.  If you have a medical emergency, please immediately call 911 or go to the emergency department.  Pager Numbers  - Dr. Gwen Pounds: [phone]  - Dr. Neale Burly: [phone]  - Dr. Roseanne Reno: [phone]  In the event of inclement weather, please call our main line at [phone] for  an update on the status of any delays or closures.  Dermatology Medication Tips: Please keep the boxes that topical medications come in in order to help keep track of the instructions about where and how to use these. Pharmacies typically print the medication instructions only on the boxes and not directly on the medication tubes.   If your medication is too expensive, please contact our office at [phone] option 4 or send [place] a message through MyChart.   We are  unable to tell what your co-pay for medications will be in advance as this is different depending on your insurance coverage. However, we may be able to find a substitute medication at lower cost or fill out paperwork to get insurance to cover a needed medication.   If a prior authorization is required to get your medication covered by your insurance company, please allow [place] 1-2 business days to complete this process.  Drug prices often vary depending on where the prescription is filled and some pharmacies may offer cheaper prices.  The website www.goodrx.com contains coupons for medications through different pharmacies. The prices here do not account for what the cost may be with help from insurance (it may be cheaper with your insurance), but the website can give you the price if you did not use any insurance.  - You can print the associated coupon and take it with your prescription to the pharmacy.  - You may also stop by our office during regular business hours and pick up a GoodRx coupon card.  - If you need your prescription sent electronically to a different pharmacy, notify our office through [facility] or by phone at [phone] option 4.     Si Usted Necesita Algo Despus de Su Visita  Tambin puede enviarnos un mensaje a travs de Clinical cytogeneticist. Por lo general respondemos a los mensajes de MyChart en el transcurso de 1 a 2 das hbiles.  Para renovar recetas, por favor pida a su farmacia que se ponga en contacto con nuestra oficina. Annie Sable de fax es [place] [phone].  Si tiene un asunto urgente cuando la clnica est cerrada y que no puede esperar hasta el siguiente da hbil, puede llamar/localizar a su doctor(a) al nmero que aparece a continuacin.   Por favor, tenga en cuenta que aunque hacemos todo lo posible para estar disponibles para asuntos urgentes fuera del horario de [place], no estamos disponibles las 24 horas del da, los 7 [address]  [address] de la [place].   Si tiene un  problema urgente y no puede comunicarse con nosotros, puede optar por buscar atencin mdica  en el consultorio de su doctor(a), en una clnica privada, en un centro de atencin urgente o en una sala de emergencias.  Si tiene Engineer, drilling, por favor llame inmediatamente al 911 o vaya a la sala de emergencias.  Nmeros de bper  - Dr. Gwen Pounds: [phone]  - Dra. Moye: [phone]  - Dra. Roseanne Reno: [phone]  En caso de inclemencias del [place], por favor llame a Lacy Duverney principal al [phone] para una actualizacin sobre el [place] de cualquier retraso o cierre.  Consejos para la medicacin en dermatologa: Por favor, guarde las cajas en las que vienen los medicamentos de uso tpico para ayudarle a seguir las instrucciones sobre dnde y cmo usarlos. Las farmacias generalmente imprimen las instrucciones del medicamento slo en las cajas y no directamente en los tubos del [place].   Si su medicamento es [employer], por favor, pngase en contacto con Ferne [place] oficina  llamando al [phone] y presione la opcin 4 o envenos un mensaje a travs de Clinical cytogeneticist.   No podemos decirle cul ser su copago por los medicamentos por adelantado ya que esto es diferente dependiendo de la cobertura de su seguro. Sin embargo, es posible que podamos encontrar un medicamento sustituto a Audiological scientist un formulario para que el seguro cubra el medicamento que se considera necesario.   Si se requiere una autorizacin previa para que su compaa de seguros [place] su medicamento, por favor permtanos de 1 a 2 das hbiles para completar [address]  Los precios de los medicamentos varan con frecuencia dependiendo del Environmental consultant de dnde se surte la receta y alguna farmacias pueden ofrecer precios ms baratos.  El sitio web www.goodrx.com tiene cupones para medicamentos de Health and safety inspector. Los precios aqu no tienen en cuenta lo que podra costar con la ayuda del seguro (puede ser ms  barato con su seguro), pero el sitio web puede darle el precio si no utiliz Tourist information centre manager.  - Puede imprimir el cupn correspondiente y llevarlo con su receta a la farmacia.  - Tambin puede pasar por nuestra oficina durante el horario de atencin regular y Education officer, museum una tarjeta de cupones de GoodRx.  - Si necesita que su receta se enve electrnicamente a una farmacia diferente, informe a nuestra oficina a travs de MyChart de [place] o por telfono llamando al [phone] y presione la opcin 4.

## 2023-09-23 ENCOUNTER — Other Ambulatory Visit: Payer: Self-pay | Admitting: Internal Medicine

## 2023-09-23 DIAGNOSIS — Z1231 Encounter for screening mammogram for malignant neoplasm of breast: Secondary | ICD-10-CM

## 2023-10-03 ENCOUNTER — Ambulatory Visit: Payer: Federal, State, Local not specified - PPO | Admitting: Dermatology

## 2023-10-24 ENCOUNTER — Ambulatory Visit: Payer: Federal, State, Local not specified - PPO

## 2023-11-03 ENCOUNTER — Ambulatory Visit: Payer: Federal, State, Local not specified - PPO | Admitting: Dermatology

## 2023-11-08 ENCOUNTER — Ambulatory Visit
Admission: RE | Admit: 2023-11-08 | Discharge: 2023-11-08 | Disposition: A | Discharge status: Home / Self Care | Payer: Federal, State, Local not specified - PPO | Source: Ambulatory Visit | Attending: Internal Medicine | Admitting: Internal Medicine

## 2023-11-08 DIAGNOSIS — Z1231 Encounter for screening mammogram for malignant neoplasm of breast: Secondary | ICD-10-CM

## 2023-11-11 ENCOUNTER — Other Ambulatory Visit: Payer: Self-pay | Admitting: Internal Medicine

## 2023-11-11 DIAGNOSIS — R928 Other abnormal and inconclusive findings on diagnostic imaging of breast: Secondary | ICD-10-CM

## 2023-11-22 ENCOUNTER — Ambulatory Visit
Admission: RE | Admit: 2023-11-22 | Discharge: 2023-11-22 | Disposition: A | Discharge status: Home / Self Care | Payer: Federal, State, Local not specified - PPO | Source: Ambulatory Visit | Attending: Internal Medicine | Admitting: Internal Medicine

## 2023-11-22 DIAGNOSIS — R928 Other abnormal and inconclusive findings on diagnostic imaging of breast: Secondary | ICD-10-CM

## 2024-01-13 ENCOUNTER — Ambulatory Visit: Admitting: Physical Therapy

## 2024-01-17 ENCOUNTER — Ambulatory Visit: Payer: Federal, State, Local not specified - PPO | Admitting: Dermatology

## 2024-01-23 ENCOUNTER — Other Ambulatory Visit: Payer: Self-pay

## 2024-01-24 ENCOUNTER — Encounter: Payer: Self-pay | Admitting: Gastroenterology

## 2024-01-24 ENCOUNTER — Ambulatory Visit: Admitting: Gastroenterology

## 2024-01-24 VITALS — BP 139/85 | HR 85 | Temp 98.2°F | Ht 65.0 in | Wt 220.2 lb

## 2024-01-24 DIAGNOSIS — R09A2 Foreign body sensation, throat: Secondary | ICD-10-CM | POA: Diagnosis not present

## 2024-01-24 MED ORDER — OMEPRAZOLE 40 MG PO CPDR: 40.0000 mg | DELAYED_RELEASE_CAPSULE | Freq: Every day | ORAL | 0 refills | Status: AC

## 2024-01-24 NOTE — Progress Notes (Unsigned)
 Karma Oz, MD [address]  [address]  [place], [place] [address]  Main: [phone]  Fax: [phone]    Gastroenterology Consultation  Referring Provider:     Tena Feeling, MD Primary Care Physician:  Tena Feeling, MD Primary Gastroenterologist:  Dr. Karma Oz Reason for Consultation: Globus sensation        HPI:   Virginia Woods is a 53 y.o. female referred by Dr. Tena Feeling, MD  for consultation & management of left lower quadrant pain.  Patient reports that about 4 days ago, she developed acute onset of left lower quadrant pain, severe in intensity, localized.  She felt severely bloated and feels like gas trapped in her abdomen, she had some relief after she passed the gas.  She generally has regular bowel movements.  She went to OB/GYN when she had this episode, she said they did some blood work, unaware of the results and she had a pelvic exam, she was told that she has a cyst and fibroid.  When she informed her OB/GYN that she has diverticulosis, she is referred to GI for further management  Patient reports that today is the best day she had since onset of symptoms.  Her pain is improving.  Her last bowel movement was day before yesterday, had a very small one yesterday, associated with straining.  She denies any fever, chills, reports mild nausea only.  Follow-up visit 01/24/2024 Ms. Bieschke is here for follow-up of her chronic recurrent symptoms of globus feeling in her throat.  She was evaluated for the same symptom in 2020, underwent double contrast barium esophagogram which showed possible 3 x 1 cm filling defect in the left side of the cervical esophagus.  Subsequently, she underwent CT soft tissue neck with contrast which was unremarkable.  Her EGD in 05/2019 was also unremarkable.  She reports feeling of throat closing up when she wakes up in the morning and sometimes with fluids.  She does not have any choking spells or food stuck in her throat or  esophagus.  She has been going through significant stress in her personal life.  Her mom passed away from lymphoma in her neck few months ago.  She has a therapist that she works with as well.  She does have history of anxiety, does not like to take medication on a regular basis.  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  She tried omeprazole  in the past which provided some relief of her symptoms.  NSAIDs: None  Antiplts/Anticoagulants/Anti thrombotics: None  GI Procedures:  Underwent colonoscopy in 2012 for rectal bleeding, found to have diverticulosis   EGD and colonoscopy 06/20/2019 - Normal duodenal bulb and second portion of the duodenum. - Normal stomach. Biopsied. - Normal gastroesophageal junction and esophagus. Biopsied.  - The examined portion of the ileum was normal. - Severe diverticulosis in the entire examined colon. There was no evidence of diverticular bleeding. - The distal rectum and anal verge are normal on retroflexion view. - No specimens collected.   Past Medical History:  Diagnosis Date   Anemia    Anxiety    Complication of anesthesia    pt reports some complication after 2nd brain surgery, but unsure of what it was.   Dental bridge present    Perrmanent dental retainer - bottom   Depression    Difficult intubation    with 2nd chiari surgery (per pt)   Family history  of adverse reaction to anesthesia    reports mother had a "negative reaction" to "anesthesia or lidocaine "   Headache    History of Chiari malformation 2011   2 surgeries 2011 - [facility]   Vitamin B12 deficiency    Vitamin D  deficiency    Wears contact lenses     Past Surgical History:  Procedure Laterality Date   BRAIN SURGERY  2011   2 surgeries for chiari malformation - [facility]   CESAREAN SECTION  1996   COLONOSCOPY  2014   COLONOSCOPY WITH PROPOFOL  N/A 06/20/2019   Procedure: COLONOSCOPY WITH PROPOFOL ;  Surgeon: Selena Daily, MD;  Location:  [facility];  Service: Endoscopy;  Laterality: N/A;   ESOPHAGOGASTRODUODENOSCOPY (EGD) WITH PROPOFOL  N/A 06/20/2019   Procedure: ESOPHAGOGASTRODUODENOSCOPY (EGD) WITH PROPOFOL ;  Surgeon: Selena Daily, MD;  Location: [facility];  Service: Endoscopy;  Laterality: N/A;   TUBAL LIGATION  2002   Current Outpatient Medications:    albuterol  (VENTOLIN  HFA) 108 (90 Base) MCG/ACT inhaler, Inhale 2 puffs into the lungs every 6 (six) hours as needed for wheezing or shortness of breath., Disp: 18 g, Rfl: 3   ALPRAZolam  (XANAX ) 0.5 MG tablet, Take 1 tablet (0.5 mg total) by mouth 2 (two) times daily as needed., Disp: 60 tablet, Rfl: 0   Ascorbic Acid (VITAMIN C PO), Take by mouth., Disp: , Rfl:    buPROPion  (WELLBUTRIN  XL) 150 MG 24 hr tablet, Take 1 tablet by mouth daily., Disp: , Rfl:    Cyanocobalamin  (VITAMIN B 12 PO), Take by mouth., Disp: , Rfl:    cyclobenzaprine  (FLEXERIL ) 10 MG tablet, Take 5-10 mg by mouth at bedtime as needed., Disp: , Rfl:    diphenhydrAMINE (BENADRYL) 25 MG tablet, Take 25 mg by mouth at bedtime as needed for sleep., Disp: , Rfl:    fexofenadine (ALLEGRA) 180 MG tablet, Take 180 mg by mouth daily., Disp: , Rfl:    gabapentin  (NEURONTIN ) 300 MG capsule, TAKE 1 CAPSULE BY MOUTH TWICE A DAY, Disp: 60 capsule, Rfl: 0   LORazepam  (ATIVAN ) 0.5 MG tablet, Take 1-2 pills 30 minutes prior to MRI, Disp: 4 tablet, Rfl: 0   mometasone  (ELOCON ) 0.1 % cream, Apply once a day for 5 days a week for at least 1 month to affected area, Disp: 30 g, Rfl: 1   nystatin  (MYCOSTATIN /NYSTOP ) powder, Apply 1 application topically 3 (three) times daily., Disp: 15 g, Rfl: 0   OMEGA-3 FATTY ACIDS PO, , Disp: , Rfl:    omeprazole  (PRILOSEC ) 40 MG capsule, Take 1 capsule (40 mg total) by mouth daily before breakfast., Disp: 30 capsule, Rfl: 0   propranolol  (INDERAL ) 10 MG tablet, TAKE 1 TABLET (10 MG TOTAL) BY MOUTH 4 (FOUR) TIMES DAILY AS NEEDED (FAST HEART RATE/PALPITATIONS). Please  make overdue appt with Dr. Alroy Aspen. 3rd and Final Attempt, Disp: 30 tablet, Rfl: 0   silver  sulfADIAZINE  (SILVADENE ) 1 % cream, Apply 1 Application topically 2 (two) times daily., Disp: 50 g, Rfl: 0   sodium bicarbonate  325 MG tablet, Take 1 tablet (325 mg total) by mouth 3 (three) times daily as needed for heartburn., Disp: 90 tablet, Rfl: 0   triamcinolone  (KENALOG ) 0.1 %, Apply 1 application topically 2 (two) times daily., Disp: 30 g, Rfl: 0   Vitamin D , Ergocalciferol , (DRISDOL ) 1.25 MG (50000 UNIT) CAPS capsule, TAKE ONE CAPSULE BY MOUTH EVERY 7 DAYS, Disp: 12 capsule, Rfl: 1   zolpidem  (AMBIEN ) 5 MG tablet, Take 0.5-1 tablets (2.5-5 mg total) by  mouth at bedtime as needed for sleep (do not take within 6 hours of xanax .)., Disp: 30 tablet, Rfl: 0    Family History  Problem Relation Age of Onset   Hypertension Mother    Cancer Mother        Laryngeal and lymphoma   Hypertension Sister    Heart disease Maternal Grandmother    Breast cancer Neg Hx      Social History   Tobacco Use   Smoking status: Never   Smokeless tobacco: Never  Vaping Use   Vaping status: Never Used  Substance Use Topics   Alcohol  use: Yes    Comment: 1-2 glasses wine per month   Drug use: No    Allergies as of 01/24/2024 - Review Complete 01/24/2024  Allergen Reaction Noted   Dust mite extract Anaphylaxis 08/23/2022   Codeine Nausea And Vomiting 08/23/2022   Molds & smuts  07/11/2021   Doxycycline  Itching and Rash 03/17/2017   Soy allergy (obsolete) Other (See Comments) 07/11/2021   Tamiflu  [oseltamivir  phosphate] Rash 08/21/2018    Review of Systems:    All systems reviewed and negative except where noted in HPI.   Physical Exam:  BP 139/85 (BP Location: Right Arm, Patient Position: Sitting, Cuff Size: Large)   Pulse 85   Temp 98.2 F (36.8 C) (Oral)   Ht 5\' 5"  (1.651 m)   Wt 220 lb 4 oz (99.9 kg)   BMI 36.65 kg/m  No LMP recorded. Patient is perimenopausal.  General:   Alert,   Well-developed, well-nourished, pleasant and cooperative in NAD Head:  Normocephalic and atraumatic. Eyes:  Sclera clear, no icterus.   Conjunctiva pink. Ears:  Normal auditory acuity. Nose:  No deformity, discharge, or lesions. Mouth:  No deformity or lesions,oropharynx pink & moist. Neck:  Supple; no masses or thyromegaly. Lungs:  Respirations even and unlabored.  Clear throughout to auscultation.   No wheezes, crackles, or rhonchi. No acute distress. Heart:  Regular rate and rhythm; no murmurs, clicks, rubs, or gallops. Abdomen:  Normal bowel sounds. Soft, left lower quadrant tenderness and non-distended without masses, hepatosplenomegaly or hernias noted.  No guarding or rebound tenderness.   Rectal: Not performed Msk:  Symmetrical without gross deformities. Good, equal movement & strength bilaterally. Pulses:  Normal pulses noted. Extremities:  No clubbing or edema.  No cyanosis. Neurologic:  Alert and oriented x3;  grossly normal neurologically. Skin:  Intact without significant lesions or rashes. No jaundice. Psych:  Alert and cooperative. Normal mood and affect.  Imaging Studies: Reviewed  Assessment and Plan:   Heven Dansereau is a 53 y.o. pleasant female with history of anxiety, BMI 36, pancolonic diverticulosis is seen in consultation for intermittent episodes of globus sensation ongoing since 2020, workup in the past was unrevealing.  Her symptoms are most likely related to anxiety and significant stress in her life, has significant insomnia.  Advised her to empirically try omeprazole  40 mg once a day before breakfast for 1 month.  Discussed about trial of psychotropic medications to help her with symptoms of globus, she deferred at this time.  Provided reassurance and if her symptoms are progressing, she will reach out to me  Follow up as needed   Karma Oz, MD

## 2024-02-03 ENCOUNTER — Ambulatory Visit: Admitting: Physical Therapy

## 2024-02-08 ENCOUNTER — Ambulatory Visit: Attending: Internal Medicine | Admitting: Physical Therapy

## 2024-02-08 DIAGNOSIS — M5412 Radiculopathy, cervical region: Secondary | ICD-10-CM | POA: Insufficient documentation

## 2024-02-08 DIAGNOSIS — M6281 Muscle weakness (generalized): Secondary | ICD-10-CM | POA: Diagnosis present

## 2024-02-08 DIAGNOSIS — M542 Cervicalgia: Secondary | ICD-10-CM | POA: Insufficient documentation

## 2024-02-08 NOTE — Therapy (Signed)
 OUTPATIENT PHYSICAL THERAPY CERVICAL EVALUATION   Patient Name: Virginia Woods MRN: [id number] DOB:[birth year], 53 y.o., female Today's Date: 02/08/2024  END OF SESSION:  PT End of Session - 02/08/24 0933     Visit Number 1    Number of Visits 7   with eval   Date for PT Re-Evaluation 05/02/24   to allow for scheduling delays   Authorization Type BCBS    PT Start Time 0932    PT Stop Time 1015    PT Time Calculation (min) 43 min    Activity Tolerance Patient tolerated treatment well    Behavior During Therapy [facility] for tasks assessed/performed             Past Medical History:  Diagnosis Date   Anemia    Anxiety    Complication of anesthesia    pt reports some complication after 2nd brain surgery, but unsure of what it was.   Dental bridge present    Perrmanent dental retainer - bottom   Depression    Difficult intubation    with 2nd chiari surgery (per pt)   Family history of adverse reaction to anesthesia    reports mother had a "negative reaction" to "anesthesia or lidocaine "   Headache    History of Chiari malformation 2011   2 surgeries 2011 - [facility]   Vitamin B12 deficiency    Vitamin D  deficiency    Wears contact lenses    Past Surgical History:  Procedure Laterality Date   BRAIN SURGERY  2011   2 surgeries for chiari malformation - [facility]   CESAREAN SECTION  1996   COLONOSCOPY  2014   COLONOSCOPY WITH PROPOFOL  N/A 06/20/2019   Procedure: COLONOSCOPY WITH PROPOFOL ;  Surgeon: Selena Daily, MD;  Location: [facility];  Service: Endoscopy;  Laterality: N/A;   ESOPHAGOGASTRODUODENOSCOPY (EGD) WITH PROPOFOL  N/A 06/20/2019   Procedure: ESOPHAGOGASTRODUODENOSCOPY (EGD) WITH PROPOFOL ;  Surgeon: Selena Daily, MD;  Location: [facility];  Service: Endoscopy;  Laterality: N/A;   TUBAL LIGATION  2002   Patient Active Problem List   Diagnosis Date Noted   Snoring 12/30/2021   Routine general medical examination at a health care facility  12/30/2021   Right shoulder pain 06/26/2021   Morbid obesity (HCC) 06/26/2021   Anemia 10/22/2020   Migraine 10/22/2020   Moderate episode of recurrent major depressive disorder (HCC) 03/30/2020   GERD (gastroesophageal reflux disease)    SVT (supraventricular tachycardia) (HCC) 04/10/2019   Mobitz type 2 second degree heart block 03/19/2019   Cervical radiculopathy 06/28/2018   Claustrophobia 12/20/2016   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   Allergic rhinitis 05/20/2015   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   Chronic pain 05/20/2015   Dermatitis, eczematoid 05/20/2015   Apnea, sleep 05/20/2015   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   Hypo-osmolality and hyponatremia 10/13/2009   Anemia, iron deficiency 05/16/2009   Anxiety 11/05/2004    PCP: Tena Feeling, MD  REFERRING PROVIDER: Tena Feeling, MD  REFERRING DIAG: G89.29 (ICD-10-CM) - Other chronic pain M54.2 (ICD-10-CM) - Cervicalgia  THERAPY DIAG:  Muscle weakness (generalized)  Cervicalgia  Radiculopathy, cervical region  Rationale for Evaluation and Treatment: Rehabilitation  ONSET DATE: 12/05/2023  SUBJECTIVE:  SUBJECTIVE STATEMENT:  Pt reports that in 2012 she was diagnosed with a Chiari malformation, she had surgery but 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 Pt reports that since that surgery she has dealt with chronic pain in the back of her neck and her head and gets pain when trying to lift her arms overhead. Pt also gets a burning in these areas if sitting and looking at a screen for too long. She will also get numbness into her hands and when reaching overhead her arms get very tired.  Pt did try PT years ago after her surgery but due to them twisting on her neck it  made her pain worse so she stopped attending and always declined therapy when it was offered or recommended.  Pt reports that her pain has never fully resolved since her surgeries but lately it has increased. Her physician believes her pain is muscular and not related to her nerves but she reports that her pain is the same pain she has always experienced. Pt has tried various pillows (orthopedic cervical pillow, no pillow) but cannot find a comfortable one. She is also unable to sleep on her stomach and it is not tolerable to turn her head to the side that much.  Pt was working as a Runner, broadcasting/film/video, is now on disability.  Hand dominance: Right  PERTINENT HISTORY:  PMH: GERD, SVT, cervical radiculopathy, Chiari malformation, chronic pain, fibromyalgia, anemia, anxiety  PAIN:  Are you having pain? Yes: NPRS scale: 5-6/10 Pain location: back of neck (suboccipital region) Pain description: burning Aggravating factors: reaching overhead, sitting, laying down Relieving factors: cold compress>heat, massager device, horseshoe rotating ball massage device  PRECAUTIONS: Fall  RED FLAGS: Cervical red flags: Dysphagia Yes: was investigated in 2023-GI believes it is related to stress, Dysmetria No, Diplopia No, Nystagmus No, and Nausea Yes: just started GLP-1    WEIGHT BEARING RESTRICTIONS: No  FALLS:  Has patient fallen in last 6 months? Yes. Number of falls one fall at work where she stepped off a curb and lands on her knees - resulted in a torn meniscus in her R knee  LIVING ENVIRONMENT: Lives with: lives with their family  OCCUPATION: on disability, used to work as a Runner, broadcasting/film/video  PLOF: Independent with gait and Independent with transfers  PATIENT GOALS: "just to help with the pain, learn some things to do at home to help with the pain"  NEXT MD VISIT: Tena Feeling, MD follow up on 02/16/2024  OBJECTIVE:  Note: Objective measures were completed at Evaluation unless otherwise noted.  DIAGNOSTIC  FINDINGS:  None updated or relevant to this POC  PATIENT SURVEYS:  NDI 18/50, moderate disability  COGNITION: Overall cognitive status: Within functional limits for tasks assessed  SENSATION: Does get N/T down into her hands at times, history of cervical radiculopathy  POSTURE: rounded shoulders and forward head  PALPATION: Tenderness to palpation in cervical paraspinals, suboccipitals, SCM, and B upper traps Hypersensitivity along rhomboids and medial border of scapula  CERVICAL ROM:   Active ROM A/PROM (deg) eval  Pain  Flexion 35 Pain along back of neck  Extension 35 If she strains has pain along back of head  Right lateral flexion 23 Pain in UT  Left lateral flexion 15 Pain in UT  Right rotation 55 none  Left rotation 30 none   (Blank rows = not tested)  UPPER EXTREMITY ROM:  Active ROM Right eval Left eval  Shoulder flexion Pain at end range Pain at end range  Shoulder extension    Shoulder abduction    Shoulder adduction    Shoulder extension    Shoulder internal rotation    Shoulder external rotation    Elbow flexion    Elbow extension    Wrist flexion    Wrist extension    Wrist ulnar deviation    Wrist radial deviation    Wrist pronation    Wrist supination     (Blank rows = not tested)  UPPER EXTREMITY MMT:  MMT Right eval Left eval  Shoulder flexion 4 4  Shoulder extension    Shoulder abduction 5 5  Shoulder adduction    Shoulder extension    Shoulder internal rotation    Shoulder external rotation    Middle trapezius    Lower trapezius    Elbow flexion 5 5  Elbow extension 5 5  Wrist flexion    Wrist extension    Wrist ulnar deviation    Wrist radial deviation    Wrist pronation    Wrist supination    Grip strength WFL WFL   (Blank rows = not tested)  CERVICAL SPECIAL TESTS:  ULNT: Median nerve (+ ) LUE, (- ) RUE but pain in UT Ulnar nerve (+ ) LUE, maybe (+) RUE Radial nerve (- ) LUE (just pain in UT), maybe (+) RUE just  feels "weird"   TREATMENT: PT Evaluation  Trial of Chirp wheel XR - pt did not tolerate well as the pressure causes increased pain. Trialed tennis balls for suboccipital release, pt with better tolerance to this. Provided handout for how to perform this exercise at home and provided tennis balls to patient.   PATIENT EDUCATION:  Education details: Eval findings, TPDN (handout), PT POC, initial HEP Person educated: Patient Education method: Explanation, Demonstration, and Handouts Education comprehension: verbalized understanding, returned demonstration, and needs further education  HOME EXERCISE PROGRAM: Access Code: [email or web address] URL: [email or web address] Date: 02/08/2024 Prepared by: Virginia Woods  Exercises - Supine Suboccipital Release with Tennis Balls  - 1 x daily - 7 x weekly - 1 sets - 1 reps - 5-10 min hold  ASSESSMENT:  CLINICAL IMPRESSION: Patient is a 53 year old female referred to Neuro OPPT for cervicalgia.   Pt's PMH is significant for: PMH: GERD, SVT, cervical radiculopathy, Chiari malformation, chronic pain, fibromyalgia, anemia, anxiety. The following deficits were present during the exam: decreased cervical ROM, increased disability level based on NDI score of 18/50, decreased strength, and increased pain. Pt would benefit from skilled PT to address these impairments and functional limitations to maximize functional mobility independence.   OBJECTIVE IMPAIRMENTS: decreased activity tolerance, decreased knowledge of condition, decreased ROM, decreased strength, increased fascial restrictions, impaired perceived functional ability, impaired sensation, impaired UE functional use, improper body mechanics, postural dysfunction, and pain.   ACTIVITY LIMITATIONS: carrying, lifting, sitting, bed mobility, and reach over head  PARTICIPATION LIMITATIONS: occupation  PERSONAL FACTORS: Past/current experiences, Time since onset of injury/illness/exacerbation, and  3+ comorbidities:   PMH: GERD, SVT, cervical radiculopathy, Chiari malformation, chronic pain, fibromyalgia, anemia, anxietyare also affecting patient's functional outcome.   REHAB POTENTIAL: Good  CLINICAL DECISION MAKING: Stable/uncomplicated  EVALUATION COMPLEXITY: Low   GOALS: Goals reviewed with patient? Yes  SHORT TERM GOALS: Target date: 03/21/2024  Pt will be independent with initial HEP for improved cervical ROM, improved posture and management of pain symptoms in order to build upon functional gains made in therapy. Baseline:  Goal status: INITIAL  LONG TERM GOALS: Target date: 05/02/2024  Pt will be independent with final HEP for improved cervical ROM, improved posture and management of pain symptoms in order to build upon functional gains made in therapy. Baseline:  Goal status: INITIAL  2.  Pt will improve her score on the NDI to >/= 13/50 to demonstrate improved function and decreased disability level Baseline: 18/50, moderate disability (5/21) Goal status: INITIAL  3.  Pt will increase her cervical AROM >/= 10 degrees in limited motions for improved function. Baseline: see eval 5/21 Goal status: INITIAL    PLAN:  PT FREQUENCY: every other week  PT DURATION: 12 weeks (for 6 total visits, due to high copay)  PLANNED INTERVENTIONS: [address]- PT Re-evaluation, 97750- Physical Performance Testing, 97110-Therapeutic exercises, 97530- Therapeutic activity, 97112- Neuromuscular re-education, 97535- Self Care, [id number]- Manual therapy, 97032- Electrical stimulation (manual), Patient/Family education, Taping, Dry Needling, Joint mobilization, Spinal mobilization, Cryotherapy, and Moist heat  PLAN FOR NEXT SESSION: Roo's Test, TPDN (cervical paraspinals, suboccipitals, SCM, and B upper traps), add to HEP (UT, levator scap, SCM, scalenes stretches, thoracic mob, cervical retract, postural stab), nerve glides?   Virginia Woods, PT Virginia Woods, PT, DPT, CSRS  02/08/2024,  1:48 PM

## 2024-02-14 ENCOUNTER — Ambulatory Visit: Admitting: Physical Therapy

## 2024-02-14 DIAGNOSIS — M542 Cervicalgia: Secondary | ICD-10-CM

## 2024-02-14 DIAGNOSIS — M5412 Radiculopathy, cervical region: Secondary | ICD-10-CM

## 2024-02-14 DIAGNOSIS — M6281 Muscle weakness (generalized): Secondary | ICD-10-CM | POA: Diagnosis not present

## 2024-02-14 NOTE — Therapy (Signed)
 OUTPATIENT PHYSICAL THERAPY CERVICAL TREATMENT   Patient Name: Analiese Krupka MRN: [id number] DOB:08-05-1971, 53 y.o., female Today's Date: 02/14/2024  END OF SESSION:  PT End of Session - 02/14/24 1143     Visit Number 2    Number of Visits 7   with eval   Date for PT Re-Evaluation 05/02/24   to allow for scheduling delays   Authorization Type [employer]    PT Start Time 1142    PT Stop Time 1223    PT Time Calculation (min) 41 min    Activity Tolerance Patient tolerated treatment well    Behavior During Therapy [facility] for tasks assessed/performed              Past Medical History:  Diagnosis Date   Anemia    Anxiety    Complication of anesthesia    pt reports some complication after 2nd brain surgery, but unsure of what it was.   Dental bridge present    Perrmanent dental retainer - bottom   Depression    Difficult intubation    with 2nd chiari surgery (per pt)   Family history of adverse reaction to anesthesia    reports mother had a "negative reaction" to "anesthesia or lidocaine "   Headache    History of Chiari malformation 2011   2 surgeries 2011 - [facility]   Vitamin B12 deficiency    Vitamin D  deficiency    Wears contact lenses    Past Surgical History:  Procedure Laterality Date   BRAIN SURGERY  2011   2 surgeries for chiari malformation - [facility]   CESAREAN SECTION  1996   COLONOSCOPY  2014   COLONOSCOPY WITH PROPOFOL  N/A 06/20/2019   Procedure: COLONOSCOPY WITH PROPOFOL ;  Surgeon: Selena Daily, MD;  Location: [facility];  Service: Endoscopy;  Laterality: N/A;   ESOPHAGOGASTRODUODENOSCOPY (EGD) WITH PROPOFOL  N/A 06/20/2019   Procedure: ESOPHAGOGASTRODUODENOSCOPY (EGD) WITH PROPOFOL ;  Surgeon: Selena Daily, MD;  Location: [facility];  Service: Endoscopy;  Laterality: N/A;   TUBAL LIGATION  2002   Patient Active Problem List   Diagnosis Date Noted   Snoring 12/30/2021   Routine general medical examination at a health care facility  12/30/2021   Right shoulder pain 06/26/2021   Morbid obesity (HCC) 06/26/2021   Anemia 10/22/2020   Migraine 10/22/2020   Moderate episode of recurrent major depressive disorder (HCC) 03/30/2020   GERD (gastroesophageal reflux disease)    SVT (supraventricular tachycardia) (HCC) 04/10/2019   Mobitz type 2 second degree heart block 03/19/2019   Cervical radiculopathy 06/28/2018   Claustrophobia 12/20/2016   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   Allergic rhinitis 05/20/2015   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   Chronic pain 05/20/2015   Dermatitis, eczematoid 05/20/2015   Apnea, sleep 05/20/2015   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 Pt has been using heat and trying the tennis balls (not every day) but has not noticed much change so far.  Hand dominance: Right  PERTINENT HISTORY:  PMH: GERD, SVT, cervical radiculopathy, Chiari malformation, chronic pain, fibromyalgia, anemia, anxiety  PAIN:  Are you having pain? Yes: NPRS scale: 5-6/10 Pain location: back of neck (suboccipital region) Pain description: burning Aggravating factors: reaching overhead, sitting, laying down Relieving factors: cold compress>heat, massager device, horseshoe rotating ball massage  device  PRECAUTIONS: Fall  RED FLAGS: Cervical red flags: Dysphagia Yes: was investigated in 2023-GI believes it is related to stress, Dysmetria No, Diplopia No, Nystagmus No, and Nausea Yes: just started GLP-1    WEIGHT BEARING RESTRICTIONS: No  FALLS:  Has patient fallen in last 6 months? Yes. Number of falls one fall at work where she stepped off a curb and lands on her knees - resulted in a torn meniscus in her R knee  LIVING ENVIRONMENT: Lives with: lives with their family  OCCUPATION: on disability, used to work as a Runner, broadcasting/film/video  PLOF: Independent with gait and Independent with transfers  PATIENT GOALS: "just to help with the pain, learn some things to do at home to help with the pain"  NEXT MD VISIT: Tena Feeling, MD follow up on 02/16/2024  OBJECTIVE:  Note: Objective measures were completed at Evaluation unless otherwise noted.  DIAGNOSTIC FINDINGS:  None updated or relevant to this POC  PATIENT SURVEYS:  NDI 18/50, moderate disability  COGNITION: Overall cognitive status: Within functional limits for tasks assessed  SENSATION: Does get N/T down into her hands at times, history of cervical radiculopathy  POSTURE: rounded shoulders and forward head  PALPATION: Tenderness to palpation in cervical paraspinals, suboccipitals, SCM, and B upper traps Hypersensitivity along rhomboids and medial border of scapula  CERVICAL ROM:   Active ROM A/PROM (deg) eval  Pain  Flexion 35 Pain along back of neck  Extension 35 If she strains has pain along back of head  Right lateral flexion 23 Pain in UT  Left lateral flexion 15 Pain in UT  Right rotation 55 none  Left rotation 30 none   (Blank rows = not tested)  UPPER EXTREMITY ROM:  Active ROM Right eval Left eval  Shoulder flexion Pain at end range Pain at end range  Shoulder extension    Shoulder abduction    Shoulder adduction    Shoulder extension    Shoulder internal rotation    Shoulder external rotation     Elbow flexion    Elbow extension    Wrist flexion    Wrist extension    Wrist ulnar deviation    Wrist radial deviation    Wrist pronation    Wrist supination     (Blank rows = not tested)  UPPER EXTREMITY MMT:  MMT Right eval Left eval  Shoulder flexion 4 4  Shoulder extension    Shoulder abduction 5 5  Shoulder adduction    Shoulder extension    Shoulder internal rotation    Shoulder external rotation    Middle trapezius    Lower trapezius    Elbow flexion 5 5  Elbow extension 5 5  Wrist flexion    Wrist extension    Wrist ulnar deviation    Wrist radial deviation    Wrist pronation    Wrist supination    Grip strength WFL WFL   (Blank rows = not  tested)  CERVICAL SPECIAL TESTS:  ULNT: Median nerve (+ ) LUE, (- ) RUE but pain in UT Ulnar nerve (+ ) LUE, maybe (+) RUE Radial nerve (- ) LUE (just pain in UT), maybe (+) RUE just feels "weird"   TREATMENT:   TherAct  Trigger Point Dry Needling  Initial Treatment: Pt instructed on Dry Needling rational, procedures, and possible side effects. Pt instructed to expect mild to moderate muscle soreness later in the day and/or into the next day.  Pt instructed in methods to reduce muscle soreness. Pt instructed to continue prescribed HEP. Because Dry Needling was performed over or adjacent to a lung field, pt was educated on S/S of pneumothorax and to seek immediate medical attention should they occur.  Patient was educated on signs and symptoms of infection and other risk factors and advised to seek medical attention should they occur.  Patient verbalized understanding of these instructions and education.   Patient Verbal Consent Given: Yes Education Handout Provided: Yes Muscles Treated: L and R cervical paraspinals, L and R upper traps Electrical Stimulation Performed: No Treatment Response/Outcome: deep ache/pressure; muscle twitch detected; L side more sensitive than R side  Trigger Point Dry  Needling  What is Trigger Point Dry Needling (DN)? DN is a physical therapy technique used to treat muscle pain and dysfunction. Specifically, DN helps deactivate muscle trigger points (muscle knots).  A thin filiform needle is used to penetrate the skin and stimulate the underlying trigger point. The goal is for a local twitch response (LTR) to occur and for the trigger point to relax. No medication of any kind is injected during the procedure.   What Does Trigger Point Dry Needling Feel Like?  The procedure feels different for each individual patient. Some patients report that they do not actually feel the needle enter the skin and overall the process is not painful. Very mild bleeding may occur. However, many patients feel a deep cramping in the muscle in which the needle was inserted. This is the local twitch response.   How Will I feel after the treatment? Soreness is normal, and the onset of soreness may not occur for a few hours. Typically this soreness does not last longer than two days.  Bruising is uncommon, however; ice can be used to decrease any possible bruising.  In rare cases feeling tired or nauseous after the treatment is normal. In addition, your symptoms may get worse before they get better, this period will typically not last longer than 24 hours.   What Can I do After My Treatment? Increase your hydration by drinking more water  for the next 24 hours.  You may place ice or heat on the areas treated that have become sore, however, do not use heat on inflamed or bruised areas. Heat often brings more relief post needling. You can continue your regular activities, but vigorous activity is not recommended initially after the treatment for 24 hours. DN is best combined with other physical therapy such as strengthening, stretching, and other therapies.   What are the complications? While your therapist has had extensive training in minimizing the risks of trigger point dry  needling, it is important to understand the risks of any procedure.  Risks include bleeding, pain, fatigue, hematoma, infection, vertigo, nausea or nerve involvement. Monitor for any changes to your skin or sensation. Contact your therapist or MD with concerns.  A rare but serious complication is a pneumothorax over or near your middle and upper chest and back If  you have dry needling in this area, monitor for the following symptoms: Shortness of breath on exertion and/or Difficulty taking a deep breath and/or Chest Pain and/or A dry cough If any of the above symptoms develop, please go to the nearest emergency room or call 911. Tell them you had dry needling over your thorax and report any symptoms you are having. Please follow-up with your treating therapist after you complete the medical evaluation.     TherEx Seated B UT stretch 3 x 30 sec each B Seated levator scap stretch 3 x 30 sec each B Seated SCM stretch 3 x 30 sec each B Seated cervical flexion stretch 3 x 30 sec Seated scalenes stretch, no stretch detected so deferred this exercise  Added to HEP, see bolded below   PATIENT EDUCATION:  Education details: TPDN (handout-see above), continue HEP and added to HEP; updated pt about charge for TPDN starting next week Person educated: Patient Education method: Explanation, Demonstration, and Handouts Education comprehension: verbalized understanding, returned demonstration, and needs further education  HOME EXERCISE PROGRAM: Access Code: [email or web address] URL: [email or web address] Date: 02/08/2024 Prepared by: Lorita Rosa  Exercises - Supine Suboccipital Release with Tennis Balls  - 1 x daily - 7 x weekly - 1 sets - 1 reps - 5-10 min hold - Seated Cervical Sidebending Stretch  - 1 x daily - 7 x weekly - 1 sets - 3-5 reps - 30 sec hold - Gentle Levator Scapulae Stretch  - 1 x daily - 7 x weekly - 1 sets - 3-5 reps - 30 sec hold - Sternocleidomastoid Stretch  - 1 x  daily - 7 x weekly - 1 sets - 3-5 reps - 30 sec hold - Sternocleidomastoid Release  - 1 x daily - 7 x weekly - 1 sets - 10 reps - Seated Cervical Flexion AROM  - 1 x daily - 7 x weekly - 1 sets - 3-5 reps - 30 sec hold  ASSESSMENT:  CLINICAL IMPRESSION: Emphasis of skilled PT session on initiating TPDN to address trigger points in cervical and upper shoulder region. Pt with good response to DN this visit, can better assess response next visit. Pt with more sensitivity in L side of her neck as compared to her R side. Followed this with various cervical and upper trap stretches, added to HEP. Pt continues to benefit from skilled PT services to work towards increased independence with management of her pain symptoms. Continue POC.    OBJECTIVE IMPAIRMENTS: decreased activity tolerance, decreased knowledge of condition, decreased ROM, decreased strength, increased fascial restrictions, impaired perceived functional ability, impaired sensation, impaired UE functional use, improper body mechanics, postural dysfunction, and pain.   ACTIVITY LIMITATIONS: carrying, lifting, sitting, bed mobility, and reach over head  PARTICIPATION LIMITATIONS: occupation  PERSONAL FACTORS: Past/current experiences, Time since onset of injury/illness/exacerbation, and 3+ comorbidities:   PMH: GERD, SVT, cervical radiculopathy, Chiari malformation, chronic pain, fibromyalgia, anemia, anxietyare also affecting patient's functional outcome.   REHAB POTENTIAL: Good  CLINICAL DECISION MAKING: Stable/uncomplicated  EVALUATION COMPLEXITY: Low   GOALS: Goals reviewed with patient? Yes  SHORT TERM GOALS: Target date: 03/21/2024  Pt will be independent with initial HEP for improved cervical ROM, improved posture and management of pain symptoms in order to build upon functional gains made in therapy. Baseline:  Goal status: INITIAL   LONG TERM GOALS: Target date: 05/02/2024  Pt will be independent with final HEP for  improved cervical ROM, improved posture and management of pain symptoms in order to build  upon functional gains made in therapy. Baseline:  Goal status: INITIAL  2.  Pt will improve her score on the NDI to >/= 13/50 to demonstrate improved function and decreased disability level Baseline: 18/50, moderate disability (5/21) Goal status: INITIAL  3.  Pt will increase her cervical AROM >/= 10 degrees in limited motions for improved function. Baseline: see eval 5/21 Goal status: INITIAL    PLAN:  PT FREQUENCY: every other week  PT DURATION: 12 weeks (for 6 total visits, due to high copay)  PLANNED INTERVENTIONS: 78295- PT Re-evaluation, 97750- Physical Performance Testing, 97110-Therapeutic exercises, 97530- Therapeutic activity, 97112- Neuromuscular re-education, 97535- Self Care, 62130- Manual therapy, [phone]- Electrical stimulation (manual), Patient/Family education, Taping, Dry Needling, Joint mobilization, Spinal mobilization, Cryotherapy, and Moist heat  PLAN FOR NEXT SESSION: Roo's Test, response to DN? TPDN??? (cervical paraspinals, suboccipitals, SCM, and B upper traps), add to HEP (thoracic mob, cervical retract, postural stab, cervical strengthening, cervical rotation stretch, cervical distraction stretch, pec stretch?), nerve glides?   Elleigh Cassetta, PT Lorita Rosa, PT, DPT, CSRS  02/14/2024, 12:23 PM

## 2024-02-23 ENCOUNTER — Other Ambulatory Visit: Payer: Self-pay | Admitting: Internal Medicine

## 2024-02-23 DIAGNOSIS — R921 Mammographic calcification found on diagnostic imaging of breast: Secondary | ICD-10-CM

## 2024-02-28 ENCOUNTER — Other Ambulatory Visit: Payer: Self-pay | Admitting: Gastroenterology

## 2024-02-28 ENCOUNTER — Ambulatory Visit: Payer: Self-pay | Admitting: Physical Therapy

## 2024-02-29 ENCOUNTER — Ambulatory Visit: Payer: Self-pay | Admitting: Physical Therapy

## 2024-03-02 ENCOUNTER — Encounter: Payer: Self-pay | Admitting: Emergency Medicine

## 2024-03-02 ENCOUNTER — Ambulatory Visit: Admission: EM | Admit: 2024-03-02 | Discharge: 2024-03-02 | Disposition: A | Discharge status: Home / Self Care

## 2024-03-02 DIAGNOSIS — M542 Cervicalgia: Secondary | ICD-10-CM | POA: Diagnosis not present
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
## 2024-03-02 NOTE — Discharge Instructions (Addendum)
 Please go to the emergency department at [facility] to be evaluated for your neck pain given your history of Chiari malformation repair.  As discussed, I feel you need a CT scan of your neck to rule out any bony abnormality.  Please go now.
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
## 2024-03-06 ENCOUNTER — Ambulatory Visit: Admitting: Dermatology

## 2024-03-06 ENCOUNTER — Encounter: Payer: Self-pay | Admitting: Dermatology

## 2024-03-06 DIAGNOSIS — R21 Rash and other nonspecific skin eruption: Secondary | ICD-10-CM

## 2024-03-06 DIAGNOSIS — L700 Acne vulgaris: Secondary | ICD-10-CM

## 2024-03-06 DIAGNOSIS — L810 Postinflammatory hyperpigmentation: Secondary | ICD-10-CM

## 2024-03-06 DIAGNOSIS — L819 Disorder of pigmentation, unspecified: Secondary | ICD-10-CM

## 2024-03-06 DIAGNOSIS — L918 Other hypertrophic disorders of the skin: Secondary | ICD-10-CM

## 2024-03-06 DIAGNOSIS — L245 Irritant contact dermatitis due to other chemical products: Secondary | ICD-10-CM

## 2024-03-06 MED ORDER — HYDROCORTISONE 2.5 % EX CREA: TOPICAL_CREAM | CUTANEOUS | 0 refills | Status: AC

## 2024-03-06 MED ORDER — TRETINOIN 0.025 % EX CREA: TOPICAL_CREAM | Freq: Every day | CUTANEOUS | 3 refills | Status: DC

## 2024-03-06 NOTE — Progress Notes (Signed)
   Follow-Up Visit   Subjective  Virginia Woods is a 53 y.o. female who presents for the following: itchy rash on the B/L axilla as well as bumps in the area. Has been happening for a while. Her deodorant brand was changed to a whole body deodorant and she was wondering if that may be the cause. Pt would like to discuss dark spots on the face and treatment options.  The following portions of the chart were reviewed this encounter and updated as appropriate: medications, allergies, medical history  Review of Systems:  No other skin or systemic complaints except as noted in HPI or Assessment and Plan.  Objective  Well appearing patient in no apparent distress; mood and affect are within normal limits.   A focused examination was performed of the following areas: the face and axillary areas   Relevant exam findings are noted in the Assessment and Plan.    Assessment & Plan   Rash Exam: Hyperpigmented slightly erythematous patches of axillary vaults  Differential diagnosis:  Irritant contact dermatitis to whole body deodorant.  Treatment Plan: Start HC 2.5% cream to aa's BID x 2 weeks. If not improved after two weeks pt to contact us  via MyChart and we can send in a strong topical steroid. D/C current deodorant.  Try vanicream deodorant  Acrochordons (Skin Tags) - Fleshy, skin-colored pedunculated papules of the R axillary area. - Benign appearing.  - Observe. - If desired, they can be removed with an in office procedure that is not covered by insurance. - Please call the clinic if you notice any new or changing lesions  ACNE VULGARIS - patient perimenopausal Exam: inflammatory papules and PIH on cheeks  Chronic and persistent condition with duration or expected duration over one year. Condition is symptomatic/ bothersome to patient. Not currently at goal.  Treatment Plan: Start Tretinoin 0.025% cream apply a pea sized amount to the entire face QHS. Start twice per  week x 1 month, then add one night per week after that, as tolerated. Topical retinoid medications like tretinoin/Retin-A, adapalene/Differin, tazarotene/Fabior, and Epiduo/Epiduo Forte can cause dryness and irritation when first started. Only apply a pea-sized amount to the entire affected area. Avoid applying it around the eyes, edges of mouth and creases at the nose. If you experience irritation, use a good moisturizer first and/or apply the medicine less often. If you are doing well with the medicine, you can increase how often you use it until you are applying every night. Be careful with sun protection while using this medication as it can make you sensitive to the sun. This medicine should not be used by pregnant women.  IRRITANT CONTACT DERMATITIS DUE TO OTHER CHEMICAL PRODUCTS   ACROCHORDON   ACNE VULGARIS   POSTINFLAMMATORY HYPERPIGMENTATION    Return if symptoms worsen or fail to improve.  Arlinda Lais, CMA, am acting as scribe for Harris Liming, MD .   Documentation: I have reviewed the above documentation for accuracy and completeness, and I agree with the above.  Harris Liming, MD

## 2024-03-06 NOTE — Patient Instructions (Addendum)
 Start Tretinoin 0.025% cream apply a pea sized amount to the entire face at night. Start twice per week x 1 month, then add one night per week after that, as tolerated. Topical retinoid medications like tretinoin/Retin-A, adapalene/Differin, tazarotene/Fabior, and Epiduo/Epiduo Forte can cause dryness and irritation when first started. Only apply a pea-sized amount to the entire affected area. Avoid applying it around the eyes, edges of mouth and creases at the nose. If you experience irritation, use a good moisturizer first and/or apply the medicine less often. If you are doing well with the medicine, you can increase how often you use it until you are applying every night. Be careful with sun protection while using this medication as it can make you sensitive to the sun. This medicine should not be used by pregnant women.      Due to recent changes in healthcare laws, you may see results of your pathology and/or laboratory studies on MyChart before the doctors have had a chance to review them. We understand that in some cases there may be results that are confusing or concerning to you. Please understand that not all results are received at the same time and often the doctors may need to interpret multiple results in order to provide you with the best plan of care or course of treatment. Therefore, we ask that you please give us  2 business days to thoroughly review all your results before contacting the office for clarification. Should we see a critical lab result, you will be contacted sooner.   If You Need Anything After Your Visit  If you have any questions or concerns for your doctor, please call our main line at [phone] and press option 4 to reach your doctor's medical assistant. If no one answers, please leave a voicemail as directed and we will return your call as soon as possible. Messages left after 4 pm will be answered the following business day.   You may also send us  a message via  MyChart. We typically respond to MyChart messages within 1-2 business days.  For prescription refills, please ask your pharmacy to contact our office. Our fax number is [phone].  If you have an urgent issue when the clinic is closed that cannot wait until the next business day, you can page your doctor at the number below.    Please note that while we do our best to be available for urgent issues outside of office hours, we are not available 24/7.   If you have an urgent issue and are unable to reach us , you may choose to seek medical care at your doctor's office, retail clinic, urgent care center, or emergency room.  If you have a medical emergency, please immediately call 911 or go to the emergency department.  Pager Numbers  - Dr. Bary Likes: [phone]  - Dr. Annette Barters: [phone]  - Dr. Felipe Horton: [phone]   In the event of inclement weather, please call our main line at [phone] for an update on the status of any delays or closures.  Dermatology Medication Tips: Please keep the boxes that topical medications come in in order to help keep track of the instructions about where and how to use these. Pharmacies typically print the medication instructions only on the boxes and not directly on the medication tubes.   If your medication is too expensive, please contact our office at [phone] option 4 or send us  a message through MyChart.   We are unable to tell what your co-pay for medications will  be in advance as this is different depending on your insurance coverage. However, we may be able to find a substitute medication at lower cost or fill out paperwork to get insurance to cover a needed medication.   If a prior authorization is required to get your medication covered by your insurance company, please allow us  1-2 business days to complete this process.  Drug prices often vary depending on where the prescription is filled and some pharmacies may offer cheaper  prices.  The website www.goodrx.com contains coupons for medications through different pharmacies. The prices here do not account for what the cost may be with help from insurance (it may be cheaper with your insurance), but the website can give you the price if you did not use any insurance.  - You can print the associated coupon and take it with your prescription to the pharmacy.  - You may also stop by our office during regular business hours and pick up a GoodRx coupon card.  - If you need your prescription sent electronically to a different pharmacy, notify our office through [facility] or by phone at [phone] option 4.     Si Usted Necesita Algo Despus de Su Visita  Tambin puede enviarnos un mensaje a travs de Clinical cytogeneticist. Por lo general respondemos a los mensajes de MyChart en el transcurso de 1 a 2 das hbiles.  Para renovar recetas, por favor pida a su farmacia que se ponga en contacto con nuestra oficina. Franz Jacks de fax es [place] [phone].  Si tiene un asunto urgente cuando la clnica est cerrada y que no puede esperar hasta el siguiente da hbil, puede llamar/localizar a su doctor(a) al nmero que aparece a continuacin.   Por favor, tenga en cuenta que aunque hacemos todo lo posible para estar disponibles para asuntos urgentes fuera del horario de [place], no estamos disponibles las 24 horas del da, los 7 [address]  [address] de la [place].   Si tiene un problema urgente y no puede comunicarse con nosotros, puede optar por buscar atencin mdica  en el consultorio de su doctor(a), en una clnica privada, en un centro de atencin urgente o en una sala de emergencias.  Si tiene Engineer, drilling, por favor llame inmediatamente al 911 o vaya a la sala de emergencias.  Nmeros de bper  - Dr. Bary Likes: [phone]  - Dra. Annette Barters: [phone]  - Dr. Felipe Horton: [phone]   En caso de inclemencias del tiempo, por favor llame a Lajuan Pila principal al [phone]  para una actualizacin sobre el [place] de cualquier retraso o cierre.  Consejos para la medicacin en dermatologa: Por favor, guarde las cajas en las que vienen los medicamentos de uso tpico para ayudarle a seguir las instrucciones sobre dnde y cmo usarlos. Las farmacias generalmente imprimen las instrucciones del medicamento slo en las cajas y no directamente en los tubos del Tab.   Si su medicamento es muy caro, por favor, pngase en contacto con Bettyjane Brunet llamando al [phone] y presione la opcin 4 o envenos un mensaje a travs de Clinical cytogeneticist.   No podemos decirle cul ser su copago por los medicamentos por adelantado ya que esto es diferente dependiendo de la cobertura de su seguro. Sin embargo, es posible que podamos encontrar un medicamento sustituto a Audiological scientist un formulario para que el seguro cubra el medicamento que se considera necesario.   Si se requiere una autorizacin previa para que su compaa de seguros [place] su medicamento, por favor permtanos de  1 a 2 das hbiles para completar [address]  Los precios de los medicamentos varan con frecuencia dependiendo del Environmental consultant de dnde se surte la receta y alguna farmacias pueden ofrecer precios ms baratos.  El sitio web www.goodrx.com tiene cupones para medicamentos de Health and safety inspector. Los precios aqu no tienen en cuenta lo que podra costar con la ayuda del seguro (puede ser ms barato con su seguro), pero el sitio web puede darle el precio si no utiliz Tourist information centre manager.  - Puede imprimir el cupn correspondiente y llevarlo con su receta a la farmacia.  - Tambin puede pasar por nuestra oficina durante el horario de atencin regular y Education officer, museum una tarjeta de cupones de GoodRx.  - Si necesita que su receta se enve electrnicamente a una farmacia diferente, informe a nuestra oficina a travs de MyChart de [place] o por telfono llamando al [phone] y presione la opcin 4.

## 2024-03-13 ENCOUNTER — Ambulatory Visit: Payer: Self-pay | Attending: Internal Medicine | Admitting: Physical Therapy

## 2024-03-13 VITALS — BP 111/74 | HR 90

## 2024-03-13 DIAGNOSIS — M542 Cervicalgia: Secondary | ICD-10-CM | POA: Diagnosis present

## 2024-03-13 DIAGNOSIS — M5412 Radiculopathy, cervical region: Secondary | ICD-10-CM | POA: Diagnosis present

## 2024-03-13 DIAGNOSIS — M6281 Muscle weakness (generalized): Secondary | ICD-10-CM | POA: Insufficient documentation
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
## 2024-03-27 ENCOUNTER — Ambulatory Visit: Payer: Self-pay | Admitting: Physical Therapy

## 2024-03-29 ENCOUNTER — Ambulatory Visit: Attending: Internal Medicine | Admitting: Physical Therapy

## 2024-03-29 VITALS — BP 111/83 | HR 83

## 2024-03-29 DIAGNOSIS — M5412 Radiculopathy, cervical region: Secondary | ICD-10-CM | POA: Diagnosis present

## 2024-03-29 DIAGNOSIS — M6281 Muscle weakness (generalized): Secondary | ICD-10-CM | POA: Insufficient documentation

## 2024-03-29 DIAGNOSIS — M542 Cervicalgia: Secondary | ICD-10-CM | POA: Insufficient documentation
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
## 2024-04-17 ENCOUNTER — Ambulatory Visit: Payer: Self-pay | Admitting: Physical Therapy

## 2024-04-17 DIAGNOSIS — M6281 Muscle weakness (generalized): Secondary | ICD-10-CM | POA: Diagnosis not present

## 2024-04-17 DIAGNOSIS — M5412 Radiculopathy, cervical region: Secondary | ICD-10-CM

## 2024-04-17 DIAGNOSIS — M542 Cervicalgia: Secondary | ICD-10-CM
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
## 2024-05-08 ENCOUNTER — Ambulatory Visit: Admitting: Hematology and Oncology

## 2024-05-08 ENCOUNTER — Other Ambulatory Visit

## 2024-05-25 ENCOUNTER — Encounter

## 2024-05-25 ENCOUNTER — Other Ambulatory Visit

## 2024-05-28 ENCOUNTER — Ambulatory Visit: Admitting: Dermatology

## 2024-05-28 DIAGNOSIS — L2089 Other atopic dermatitis: Secondary | ICD-10-CM

## 2024-05-28 DIAGNOSIS — L280 Lichen simplex chronicus: Secondary | ICD-10-CM

## 2024-05-28 DIAGNOSIS — L700 Acne vulgaris: Secondary | ICD-10-CM | POA: Diagnosis not present

## 2024-05-28 DIAGNOSIS — L810 Postinflammatory hyperpigmentation: Secondary | ICD-10-CM | POA: Diagnosis not present

## 2024-05-28 DIAGNOSIS — L209 Atopic dermatitis, unspecified: Secondary | ICD-10-CM

## 2024-05-28 DIAGNOSIS — L816 Other disorders of diminished melanin formation: Secondary | ICD-10-CM

## 2024-05-28 MED ORDER — CLOBETASOL PROPIONATE 0.05 % EX CREA: TOPICAL_CREAM | CUTANEOUS | 0 refills | Status: AC

## 2024-05-28 NOTE — Patient Instructions (Signed)

## 2024-05-28 NOTE — Progress Notes (Signed)
 Follow-Up Visit   Subjective  Virginia Woods is a 53 y.o. female who presents for the following: Pt c/o lightening of the face after using hydroquinone /tretinoin /kojic acid/niacinamide/fluocinolone mix under occulsion with a mighty patch to dark spots on the face x 2-3 weeks. Now she has lighter colored patches and is concerned because she has two different types of pigmentation on the face, and would like to discuss best tx options.   The following portions of the chart were reviewed this encounter and updated as appropriate: medications, allergies, medical history  Review of Systems:  No other skin or systemic complaints except as noted in HPI or Assessment and Plan.  Objective  Well appearing patient in no apparent distress; mood and affect are within normal limits.   A focused examination was performed of the following areas: the face   Relevant exam findings are noted in the Assessment and Plan.    Assessment & Plan     POST-INFLAMMATORY HYPERPIGMENTATION (PIH) with Hypopigmentation from Hydroquinone cream  Exam: hyperpigmented macules at the L cheek due to acne with surrounding hypopigmentation from topical HQ cream   Post-inflammatory hyperpimentation (PIH)  is a benign condition that comes from having previous inflammation in the skin and will fade with time over months to sometimes years. Recommend daily sun protection including sunscreen SPF 30+ to sun-exposed areas. - Recommend treating any itchy or red areas on the skin quickly to prevent new areas of PIH. Once rash has cleared, treating with prescription medicines such as hydroquinone may help fade dark spots faster.    Treatment Plan: D/C Skin Medicinal hydroquinone mix, and do not use under occlusion in the future as that will make the medication stronger. May store in refrigerator to keep preserved for longer if needed in the future.  Discussed with patient that hypopigmentation will take time to  improve.   Recommend daily broad spectrum sunscreen SPF 30+ to sun-exposed areas, reapply every 2 hours as needed. Call for new or changing lesions.  Staying in the shade or wearing long sleeves, sun glasses (UVA+UVB protection) and wide brim hats (4-inch brim around the entire circumference of the hat) are also recommended for sun protection.    ACNE VULGARIS Exam: Inflamed large comedones of the L medial cheek. See photo  Chronic and persistent condition with duration or expected duration over one year. Condition is symptomatic/ bothersome to patient. Not currently at goal.  Treatment Plan: Start Arazlo lotion samples x 3 to aa QHS. Topical retinoid medications like tretinoin /Retin-A , adapalene/Differin, tazarotene/Fabior, and Epiduo/Epiduo Forte can cause dryness and irritation when first started. Only apply a pea-sized amount to the entire affected area. Avoid applying it around the eyes, edges of mouth and creases at the nose. If you experience irritation, use a good moisturizer first and/or apply the medicine less often. If you are doing well with the medicine, you can increase how often you use it until you are applying every night. Be careful with sun protection while using this medication as it can make you sensitive to the sun. This medicine should not be used by pregnant women.   Use a moisturizer like Neutragena Hydro Boost (samples given) prior to applying.  Pt may call for RX if needed  Also discussed extraction if topical retinoid not effective in clearing spots.  But extraction may temporarily worsen PIH.  ATOPIC DERMATITIS with lichenification Exam: Lichenified violaceous plaque of the L elbow. 1% BSA  Chronic and persistent condition with duration or expected duration over  one year. Condition is bothersome/symptomatic for patient. Currently flared.  Atopic dermatitis (eczema) is a chronic, relapsing, pruritic condition that can significantly affect quality of life. It is  often associated with allergic rhinitis and/or asthma and can require treatment with topical medications, phototherapy, or in severe cases biologic injectable medication (Dupixent; Adbry) or Oral JAK inhibitors.  Treatment Plan: Start Clobetasol  0.05% cream BID PRN until rash cleared Caution skin atrophy with long-term use.  Topical steroids (such as triamcinolone , fluocinolone, fluocinonide, mometasone , clobetasol , halobetasol, betamethasone , hydrocortisone ) can cause thinning and lightening of the skin if they are used for too long in the same area. Your physician has selected the right strength medicine for your problem and area affected on the body. Please use your medication only as directed by your physician to prevent side effects.   Avoid scratching/rubbing/picking area  Recommend gentle skin care.   Return in about 3 months (around 08/27/2024) for acne and PIH follow up.  LILLETTE Rosina Mayans, CMA, am acting as scribe for Rexene Rattler, MD .   Documentation: I have reviewed the above documentation for accuracy and completeness, and I agree with the above.  Rexene Rattler, MD

## 2024-06-04 ENCOUNTER — Telehealth: Payer: Self-pay | Admitting: Family Medicine

## 2024-06-04 NOTE — Telephone Encounter (Signed)
 Called and LVM for pt to call and schedule an appt with MD so that the pt can re-establish with a MD again.

## 2024-06-04 NOTE — Telephone Encounter (Signed)
 Pt called to see if MD would be able to get tested for Neuropathy here , Pt Rheumatologist informed Pt to follow up with MD about performing this test the patient has not been seen since 2023. Pt was told she didn't need a new referral for this diagnosed . Pt last saw Dr. Rush .Looks like appt with amy was Canceled .

## 2024-06-04 NOTE — Telephone Encounter (Signed)
 Pt will need to schedule appt with another MD here to re-establish here and be re-evaluated for neuropathy

## 2024-06-05 ENCOUNTER — Ambulatory Visit

## 2024-06-05 ENCOUNTER — Ambulatory Visit
Admission: RE | Admit: 2024-06-05 | Discharge: 2024-06-05 | Disposition: A | Discharge status: Home / Self Care | Source: Ambulatory Visit | Attending: Internal Medicine | Admitting: Internal Medicine

## 2024-06-05 DIAGNOSIS — R921 Mammographic calcification found on diagnostic imaging of breast: Secondary | ICD-10-CM

## 2024-06-21 ENCOUNTER — Other Ambulatory Visit: Payer: Self-pay

## 2024-06-21 ENCOUNTER — Emergency Department (HOSPITAL_COMMUNITY): Admission: EM | Admit: 2024-06-21 | Discharge: 2024-06-21 | Disposition: A | Discharge status: Home / Self Care

## 2024-06-21 ENCOUNTER — Emergency Department (HOSPITAL_COMMUNITY)

## 2024-06-21 DIAGNOSIS — K5792 Diverticulitis of intestine, part unspecified, without perforation or abscess without bleeding: Secondary | ICD-10-CM

## 2024-06-21 DIAGNOSIS — R1032 Left lower quadrant pain: Secondary | ICD-10-CM | POA: Diagnosis present

## 2024-06-21 DIAGNOSIS — K5732 Diverticulitis of large intestine without perforation or abscess without bleeding: Secondary | ICD-10-CM | POA: Insufficient documentation

## 2024-06-21 LAB — COMPREHENSIVE METABOLIC PANEL WITH GFR
ALT: 22 U/L (ref 0–44)
AST: 24 U/L (ref 15–41)
Albumin: 3.4 g/dL — ABNORMAL LOW (ref 3.5–5.0)
Alkaline Phosphatase: 77 U/L (ref 38–126)
Anion gap: 9 (ref 5–15)
BUN: 10 mg/dL (ref 6–20)
CO2: 24 mmol/L (ref 22–32)
Calcium: 8.8 mg/dL — ABNORMAL LOW (ref 8.9–10.3)
Chloride: 105 mmol/L (ref 98–111)
Creatinine, Ser: 0.86 mg/dL (ref 0.44–1.00)
GFR, Estimated: >60 mL/min (ref >60)
Glucose, Bld: 122 mg/dL — ABNORMAL HIGH (ref 70–99)
Potassium: 3.8 mmol/L (ref 3.5–5.1)
Sodium: 138 mmol/L (ref 135–145)
Total Bilirubin: 0.7 mg/dL (ref 0.0–1.2)
Total Protein: 6.4 g/dL — ABNORMAL LOW (ref 6.5–8.1)

## 2024-06-21 LAB — CBC WITH DIFFERENTIAL/PLATELET
Abs Immature Granulocytes: 0.03 K/uL (ref 0.00–0.07)
Basophils Absolute: 0 K/uL (ref 0.0–0.1)
Basophils Relative: 0 %
Eosinophils Absolute: 0.1 K/uL (ref 0.0–0.5)
Eosinophils Relative: 1 %
HCT: 43.9 % (ref 36.0–46.0)
Hemoglobin: 14.1 g/dL (ref 12.0–15.0)
Immature Granulocytes: 0 %
Lymphocytes Relative: 19 %
Lymphs Abs: 1.6 K/uL (ref 0.7–4.0)
MCH: 24.9 pg — ABNORMAL LOW (ref 26.0–34.0)
MCHC: 32.1 g/dL (ref 30.0–36.0)
MCV: 77.6 fL — ABNORMAL LOW (ref 80.0–100.0)
Monocytes Absolute: 0.5 K/uL (ref 0.1–1.0)
Monocytes Relative: 7 %
Neutro Abs: 5.9 K/uL (ref 1.7–7.7)
Neutrophils Relative %: 73 %
Platelets: 293 K/uL (ref 150–400)
RBC: 5.66 MIL/uL — ABNORMAL HIGH (ref 3.87–5.11)
RDW: 13.9 % (ref 11.5–15.5)
WBC: 8.1 K/uL (ref 4.0–10.5)
nRBC: 0 % (ref 0.0–0.2)

## 2024-06-21 LAB — URINALYSIS, ROUTINE W REFLEX MICROSCOPIC
Bilirubin Urine: NEGATIVE
Glucose, UA: NEGATIVE mg/dL
Hgb urine dipstick: NEGATIVE
Ketones, ur: NEGATIVE mg/dL
Leukocytes,Ua: NEGATIVE
Nitrite: NEGATIVE
Protein, ur: NEGATIVE mg/dL
Specific Gravity, Urine: 1.021 (ref 1.005–1.030)
pH: 7 (ref 5.0–8.0)

## 2024-06-21 LAB — LIPASE, BLOOD: Lipase: 23 U/L (ref 11–51)

## 2024-06-21 MED ORDER — IOHEXOL 350 MG/ML SOLN
75.0000 mL | Freq: Once | INTRAVENOUS | Status: AC | PRN
  Administered 2024-06-21: 75 mL via INTRAVENOUS

## 2024-06-21 MED ORDER — SODIUM CHLORIDE 0.9 % IV BOLUS
1000.0000 mL | Freq: Once | INTRAVENOUS | Status: AC
  Administered 2024-06-21: 1000 mL via INTRAVENOUS

## 2024-06-21 MED ORDER — EPINEPHRINE 0.3 MG/0.3ML IJ SOAJ: 0.3000 mg | INTRAMUSCULAR | 0 refills | Status: AC | PRN

## 2024-06-21 MED ORDER — AMOXICILLIN-POT CLAVULANATE 875-125 MG PO TABS: 1.0000 | ORAL_TABLET | Freq: Three times a day (TID) | ORAL | 0 refills | Status: AC

## 2024-06-21 NOTE — ED Triage Notes (Signed)
 Pt. BIB [facility] from home with c/o abdominal pain that started last night after eating [employer]; Pain coming fro L side and this morning she noticed some swellig of her hands and feet. Hx of Diverticulitis and Fibroids. VSS per [facility].

## 2024-06-21 NOTE — Discharge Instructions (Addendum)
 You have evidence of diverticulitis on the CT scan.  This is an infection of one of the outpouchings of your intestines.  I included food for you to avoid in the immediate future.  I have also started you on antibiotics.  You have any kind of worsening pain especially with any kind of fevers or bloody stools then please come back to the ED for further evaluation.  Please follow-up with your gastroenterologist.   Did call in epinephrine for you.  This does not necessarily seem like a definitive allergic reaction but unclear.  I think having this at your house is a reasonable thing to have.  If you have or have any kind of rash, difficulty breathing, nausea vomit diarrhea or feeling lightheaded then you will give yourself a shot and then come back to the ED immediately.
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
## 2024-06-29 ENCOUNTER — Other Ambulatory Visit: Payer: Self-pay | Admitting: *Deleted

## 2024-06-29 DIAGNOSIS — D508 Other iron deficiency anemias: Secondary | ICD-10-CM

## 2024-06-29 NOTE — Progress Notes (Signed)
 Received call from pt requesting lab and MD f/u.  Pt states she was recently seen in ED and RBC, MCV, and MCH  were elevated and she would like to further discuss with MD.  Pt also requesting iron studies be drawn to assess if oral iron is working.  Orders placed, message sent to scheduling team for lab appt and MD visit 2 days later.

## 2024-07-02 ENCOUNTER — Inpatient Hospital Stay: Attending: Hematology and Oncology

## 2024-07-02 ENCOUNTER — Telehealth: Payer: Self-pay

## 2024-07-02 DIAGNOSIS — D509 Iron deficiency anemia, unspecified: Secondary | ICD-10-CM | POA: Diagnosis present

## 2024-07-02 DIAGNOSIS — D508 Other iron deficiency anemias: Secondary | ICD-10-CM

## 2024-07-02 LAB — CBC WITH DIFFERENTIAL (CANCER CENTER ONLY)
Abs Immature Granulocytes: 0.02 K/uL (ref 0.00–0.07)
Basophils Absolute: 0 K/uL (ref 0.0–0.1)
Basophils Relative: 1 %
Eosinophils Absolute: 0.1 K/uL (ref 0.0–0.5)
Eosinophils Relative: 2 %
HCT: 41.9 % (ref 36.0–46.0)
Hemoglobin: 13.4 g/dL (ref 12.0–15.0)
Immature Granulocytes: 1 %
Lymphocytes Relative: 38 %
Lymphs Abs: 1.6 K/uL (ref 0.7–4.0)
MCH: 24.5 pg — ABNORMAL LOW (ref 26.0–34.0)
MCHC: 32 g/dL (ref 30.0–36.0)
MCV: 76.5 fL — ABNORMAL LOW (ref 80.0–100.0)
Monocytes Absolute: 0.4 K/uL (ref 0.1–1.0)
Monocytes Relative: 9 %
Neutro Abs: 2 K/uL (ref 1.7–7.7)
Neutrophils Relative %: 49 %
Platelet Count: 321 K/uL (ref 150–400)
RBC: 5.48 MIL/uL — ABNORMAL HIGH (ref 3.87–5.11)
RDW: 13.7 % (ref 11.5–15.5)
WBC Count: 4.1 K/uL (ref 4.0–10.5)
nRBC: 0 % (ref 0.0–0.2)

## 2024-07-02 LAB — IRON AND IRON BINDING CAPACITY (CC-WL,HP ONLY)
Iron: 68 ug/dL (ref 28–170)
Saturation Ratios: 18 % (ref 10.4–31.8)
TIBC: 385 ug/dL (ref 250–450)
UIBC: 317 ug/dL (ref 148–442)

## 2024-07-02 LAB — FERRITIN: Ferritin: 101 ng/mL (ref 11–307)

## 2024-07-02 NOTE — Telephone Encounter (Signed)
 Pt picked up all medical records on 07/02/2024

## 2024-07-04 ENCOUNTER — Inpatient Hospital Stay (HOSPITAL_BASED_OUTPATIENT_CLINIC_OR_DEPARTMENT_OTHER): Admitting: Hematology and Oncology

## 2024-07-04 DIAGNOSIS — D509 Iron deficiency anemia, unspecified: Secondary | ICD-10-CM

## 2024-07-04 DIAGNOSIS — Z808 Family history of malignant neoplasm of other organs or systems: Secondary | ICD-10-CM

## 2024-07-04 NOTE — Assessment & Plan Note (Signed)
 Lab review 2020: Iron saturation 7%, ferritin 13 2021: Iron saturation 7%, ferritin 23 01/05/2021: Iron saturation 8%, ferritin 13, TIBC 420, hemoglobin 11.5, MCV 73 07/24/2021: Hemoglobin 12.1, MCV 74 ferritin 26, iron saturation 16% 01/26/2022: Hemoglobin 13.5, MCV 77, iron saturation 18%, TIBC 451, ferritin 26 02/21/2023: Hb 13.8, MCV 77.6, Iron Sat: 26%, Ferritin 41 07/02/2024: Hemoglobin 13.4, MCV 76.5, iron saturation 18%, ferritin 101   Iron deficiency with only mild anemia: Longstanding Differential diagnosis blood loss versus malabsorption (she had colonoscopy 2 years ago and that was negative for bleeding, she does have couple of days of heavy menstrual cycles every month.)  I discussed with the patient that she has microcytosis without iron deficiency and therefore it is very possible that she may have beta thalassemia minor.

## 2024-07-04 NOTE — Progress Notes (Signed)
 HEMATOLOGY-ONCOLOGY TELEPHONE VISIT PROGRESS NOTE  I connected with our patient on 07/04/24 at  9:00 AM EDT by telephone and verified that I am speaking with the correct person using two identifiers.  I discussed the limitations, risks, security and privacy concerns of performing an evaluation and management service by telephone and the availability of in person appointments.  I also discussed with the patient that there may be a patient responsible charge related to this service. The patient expressed understanding and agreed to proceed.   History of Present Illness:   History of Present Illness [place] is a 53 year old female who presents for follow-up after a recent diagnosis of diverticulitis.  Her recent blood work on July 02, 2024, shows a hemoglobin level of 13.4, consistent with previous results. Iron saturation is 18%, and ferritin is 101, indicating adequate iron stores. MCV has been persistently low. She expresses concern about her red blood cell count, which is slightly lower than in the past.  She takes vitamin B12 supplements daily but is unsure if another supplement may contain B12.  Her mother passed away last year from blood cancer, causing anxiety when she observes abnormalities in her blood work.    REVIEW OF SYSTEMS:   Constitutional: Denies fevers, chills or abnormal weight loss All other systems were reviewed with the patient and are negative. Observations/Objective:     Assessment Plan:  Anemia Lab review 2020: Iron saturation 7%, ferritin 13 2021: Iron saturation 7%, ferritin 23 01/05/2021: Iron saturation 8%, ferritin 13, TIBC 420, hemoglobin 11.5, MCV 73 07/24/2021: Hemoglobin 12.1, MCV 74 ferritin 26, iron saturation 16% 01/26/2022: Hemoglobin 13.5, MCV 77, iron saturation 18%, TIBC 451, ferritin 26 02/21/2023: Hb 13.8, MCV 77.6, Iron Sat: 26%, Ferritin 41 07/02/2024: Hemoglobin 13.4, MCV 76.5, iron saturation 18%, ferritin 101   Iron deficiency  with only mild anemia: Longstanding Differential diagnosis blood loss versus malabsorption (she had colonoscopy 2 years ago and that was negative for bleeding, she does have couple of days of heavy menstrual cycles every month.)  I discussed with the patient that she has microcytosis without iron deficiency and therefore it is very possible that she may have beta thalassemia minor. We will check for hemoglobin electrophoresis next year when she comes for blood check  Patient's mother died of blood related issues and therefore she is very anxious about her abnormalities on the blood work.  I reassured her that there is no concern for polycythemia or leukemia.  The slight increase in the red blood cell count could be due to mild underlying obstructive sleep apnea which she was previously diagnosed with.   Labs in 1 year and telephone visit 1 week later Assessment & Plan Iron deficiency anemia Iron deficiency anemia is well-managed with hemoglobin at 13.4, iron saturation at 18%, and ferritin at 101, indicating adequate iron stores. - Monitor iron levels annually.  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 Hemoglobin electrophoresis recommended for confirmation. - Offer hemoglobin electrophoresis next year to confirm beta thalassemia trait.  Family history of blood cancer Reassured her that current lab results do not indicate any hematologic malignancy. - Schedule annual lab work to monitor blood levels. - Plan follow-up call to discuss lab results after testing.      I discussed the assessment and treatment plan with the patient. The patient was provided an opportunity to ask questions and all were answered. The patient agreed with the plan and demonstrated an understanding of the instructions.  The patient was advised to call back or seek an in-person evaluation if the symptoms worsen or if the condition fails to improve as anticipated.   I  provided 20 minutes of non-face-to-face time during this encounter.  This includes time for charting and coordination of care   Naomi MARLA Chad, MD

## 2024-08-10 ENCOUNTER — Encounter: Payer: Self-pay | Admitting: Emergency Medicine

## 2024-08-10 ENCOUNTER — Ambulatory Visit
Admission: EM | Admit: 2024-08-10 | Discharge: 2024-08-10 | Disposition: A | Discharge status: Home / Self Care | Attending: Physician Assistant | Admitting: Physician Assistant

## 2024-08-10 DIAGNOSIS — R0981 Nasal congestion: Secondary | ICD-10-CM | POA: Diagnosis present

## 2024-08-10 DIAGNOSIS — J029 Acute pharyngitis, unspecified: Secondary | ICD-10-CM | POA: Insufficient documentation

## 2024-08-10 LAB — POC SOFIA SARS ANTIGEN FIA: SARS Coronavirus 2 Ag: NEGATIVE

## 2024-08-10 LAB — POCT RAPID STREP A (OFFICE): Rapid Strep A Screen: NEGATIVE

## 2024-08-10 MED ORDER — LIDOCAINE VISCOUS HCL 2 % MT SOLN: 15.0000 mL | OROMUCOSAL | 0 refills | Status: AC | PRN

## 2024-08-10 NOTE — ED Triage Notes (Signed)
 Pt c/o sore throat, nasal congestion, post nasal drip. Started about 3 days ago. Unsure if she has had a fever.

## 2024-08-10 NOTE — Discharge Instructions (Signed)
-  Negative COVID and rapid strep -If strep culture is positive we will call and send antibiotics  URI/COLD SYMPTOMS: Your exam today is consistent with a viral illness. Antibiotics are not indicated at this time. Use medications as directed, including cough syrup, nasal saline, and decongestants. Your symptoms should improve over the next few days and resolve within 7-10 days. Increase rest and fluids. F/u if symptoms worsen or predominate such as sore throat, ear pain, productive cough, shortness of breath, or if you develop high fevers or worsening fatigue over the next several days.

## 2024-08-10 NOTE — ED Provider Notes (Signed)
 [facility]    CSN: [id number] Arrival date & time: 08/10/24  9188      History   Chief Complaint Chief Complaint  Patient presents with   Sore Throat   Nasal Congestion    HPI Virginia Woods is a 53 y.o. female presenting for fatigue, congestion, sore throat and slight cough x 3 days.  Denies fever, ear pain, sinus pain, chest pain, wheezing, shortness of breath, abdominal pain, vomiting or diarrhea.  Patient has been not been taking over-the-counter meds. No other complaints.   HPI  Past Medical History:  Diagnosis Date   Anemia    Anxiety    Complication of anesthesia    pt reports some complication after 2nd brain surgery, but unsure of what it was.   Dental bridge present    Perrmanent dental retainer - bottom   Depression    Difficult intubation    with 2nd chiari surgery (per pt)   Family history of adverse reaction to anesthesia    reports mother had a negative reaction to anesthesia or lidocaine    Headache    History of Chiari malformation 2011   2 surgeries 2011 - [facility]   Vitamin B12 deficiency    Vitamin D  deficiency    Wears contact lenses     Patient Active Problem List   Diagnosis Date Noted   Snoring 12/30/2021   Routine general medical examination at a health care facility 12/30/2021   Right shoulder pain 06/26/2021   Morbid obesity (HCC) 06/26/2021   Anemia 10/22/2020   Migraine 10/22/2020   Moderate episode of recurrent major depressive disorder (HCC) 03/30/2020   GERD (gastroesophageal reflux disease)    SVT (supraventricular tachycardia) 04/10/2019   Mobitz type 2 second degree heart block 03/19/2019   Cervical radiculopathy 06/28/2018   Claustrophobia 12/20/2016   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   Allergic rhinitis 05/20/2015   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   Chronic pain 05/20/2015   Dermatitis, eczematoid 05/20/2015   Apnea, sleep 05/20/2015   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   Hypo-osmolality and  hyponatremia 10/13/2009   Anemia, iron deficiency 05/16/2009   Anxiety 11/05/2004    Past Surgical History:  Procedure Laterality Date   BRAIN SURGERY  2011   2 surgeries for chiari malformation - [facility]   CESAREAN SECTION  1996   COLONOSCOPY  2014   COLONOSCOPY WITH PROPOFOL  N/A 06/20/2019   Procedure: COLONOSCOPY WITH PROPOFOL ;  Surgeon: Unk Corinn Skiff, MD;  Location[facility];  Service: Endoscopy;  Laterality: N/A;   ESOPHAGOGASTRODUODENOSCOPY (EGD) WITH PROPOFOL  N/A 06/20/2019   Procedure: ESOPHAGOGASTRODUODENOSCOPY (EGD) WITH PROPOFOL ;  Surgeon: Unk Corinn Skiff, MD;  Location: [facility];  Service: Endoscopy;  Laterality: N/A;   TUBAL LIGATION  2002    OB History   No obstetric history on file.      Home Medications    Prior to Admission medications   Medication Sig Start Date End Date Taking? Authorizing Provider  ALPRAZolam  (XANAX ) 0.5 MG tablet Take 1 tablet (0.5 mg total) by mouth 2 (two) times daily as needed. 11/20/21  Yes Norleen Lynwood ORN, MD  gabapentin  (NEURONTIN ) 300 MG capsule TAKE 1 CAPSULE BY MOUTH TWICE A DAY 09/09/22  Yes Rollene Almarie LABOR, MD  lidocaine  (XYLOCAINE ) 2 % solution Use as directed 15 mLs in the mouth or throat every 3 (three) hours as needed for mouth pain (swish and spit). 08/10/24  Yes Arvis Huxley B, PA-C  omeprazole  (PRILOSEC ) 40 MG capsule Take 1 capsule (40  mg total) by mouth daily before breakfast. 01/24/24  Yes Vanga, Rohini Reddy, MD  propranolol  (INDERAL ) 10 MG tablet TAKE 1 TABLET (10 MG TOTAL) BY MOUTH 4 (FOUR) TIMES DAILY AS NEEDED (FAST HEART RATE/PALPITATIONS). Please make overdue appt with Dr. Alveta. 3rd and Final Attempt 11/20/21  Yes Norleen Lynwood ORN, MD  sodium bicarbonate  325 MG tablet Take 1 tablet (325 mg total) by mouth 3 (three) times daily as needed for heartburn. 11/20/21  Yes Norleen Lynwood ORN, MD  albuterol  (VENTOLIN  HFA) 108 [phone] Base) MCG/ACT inhaler Inhale 2 puffs into the lungs every 6 (six) hours as needed  for wheezing or shortness of breath. 11/20/21   Norleen Lynwood ORN, MD  Ascorbic Acid (VITAMIN C PO) Take by mouth.    [provider]  buPROPion  (WELLBUTRIN  XL) 150 MG 24 hr tablet Take 1 tablet by mouth daily.    [provider]  clobetasol  cream (TEMOVATE ) 0.05 % Apply to eczema on L elbow BID PRN. Avoid applying to face, groin, and axilla. Use as directed. Long-term use can cause thinning of the skin. 05/28/24   Jackquline Sawyer, MD  Cyanocobalamin  (VITAMIN B 12 PO) Take by mouth.    [provider]  cyclobenzaprine  (FLEXERIL ) 10 MG tablet Take 5-10 mg by mouth at bedtime as needed. 12/05/23   [provider]  diphenhydrAMINE (BENADRYL) 25 MG tablet Take 25 mg by mouth at bedtime as needed for sleep.    [provider]  EPINEPHrine  0.3 mg/0.3 mL IJ SOAJ injection Inject 0.3 mg into the muscle as needed for anaphylaxis. 06/21/24   Simon Lavonia SAILOR, MD  fexofenadine (ALLEGRA) 180 MG tablet Take 180 mg by mouth daily.    [provider]  hydrocortisone  2.5 % cream Apply to aa rash of the B/L axilla BID x 2 weeks. 03/06/24   Claudene Lehmann, MD  LORazepam  (ATIVAN ) 0.5 MG tablet Take 1-2 pills 30 minutes prior to MRI 08/24/22   Rush Nest, MD  methocarbamol  (ROBAXIN ) 500 MG tablet Take 1,000 mg by mouth 4 (four) times daily. 03/02/24   [provider]  nystatin  (MYCOSTATIN /NYSTOP ) powder Apply 1 application topically 3 (three) times daily. 08/20/20   Vivienne Nest HERO, PA-C  OMEGA-3 FATTY ACIDS PO  05/01/09   [provider]  silver  sulfADIAZINE  (SILVADENE ) 1 % cream Apply 1 Application topically 2 (two) times daily. Patient not taking: Reported on 05/28/2024 02/21/23   Haze Lonni PARAS, MD  tretinoin  (RETIN-A ) 0.025 % cream Apply topically at bedtime. Patient not taking: Reported on 05/28/2024 03/06/24 03/06/25  Claudene Lehmann, MD  Vitamin D , Ergocalciferol , (DRISDOL ) 1.25 MG (50000 UNIT) CAPS capsule TAKE ONE CAPSULE BY MOUTH EVERY  7 DAYS 11/29/20   Flinchum, Rosaline RAMAN, FNP  WEGOVY 0.25 MG/0.5ML SOAJ SQ injection Inject 0.25 mg into the skin every 7 (seven) days. 02/27/24   [provider]  zolpidem  (AMBIEN ) 5 MG tablet Take 0.5-1 tablets (2.5-5 mg total) by mouth at bedtime as needed for sleep (do not take within 6 hours of xanax .). 11/20/21   Norleen Lynwood ORN, MD    Family History Family History  Problem Relation Age of Onset   Hypertension Mother    Cancer Mother        Laryngeal and lymphoma   Hypertension Sister    Heart disease Maternal Grandmother    Breast cancer Neg Hx     Social History Social History   Tobacco Use   Smoking status: Never   Smokeless tobacco: Never  Vaping Use  Vaping status: Never Used  Substance Use Topics   Alcohol  use: Yes    Comment: 1-2 glasses wine per month   Drug use: No     Allergies   Dust mite extract, Codeine, Molds & smuts, Doxycycline , Soy allergy (obsolete), and Tamiflu  [oseltamivir  phosphate]   Review of Systems Review of Systems  Constitutional:  Positive for fatigue. Negative for chills, diaphoresis and fever.  HENT:  Positive for congestion, rhinorrhea and sore throat. Negative for ear pain, sinus pressure and sinus pain.   Respiratory:  Positive for cough. Negative for shortness of breath.   Cardiovascular:  Negative for chest pain.  Gastrointestinal:  Negative for abdominal pain, nausea and vomiting.  Musculoskeletal:  Negative for arthralgias and myalgias.  Skin:  Negative for rash.  Neurological:  Negative for weakness and headaches.  Hematological:  Negative for adenopathy.     Physical Exam Triage Vital Signs ED Triage Vitals  Encounter Vitals Group     BP 08/10/24 0822 128/85     Girls Systolic BP Percentile --      Girls Diastolic BP Percentile --      Boys Systolic BP Percentile --      Boys Diastolic BP Percentile --      Pulse Rate 08/10/24 0822 82     Resp 08/10/24 0822 16     Temp 08/10/24 0822 99.2 F (37.3 C)     Temp  Source 08/10/24 0822 Oral     SpO2 08/10/24 0822 94 %     Weight 08/10/24 0821 223 lb 1.7 oz (101.2 kg)     Height 08/10/24 0821 5' 5 (1.651 m)     Head Circumference --      Peak Flow --      Pain Score 08/10/24 0821 6     Pain Loc --      Pain Education --      Exclude from Growth Chart --    No data found.  Updated Vital Signs BP 128/85 (BP Location: Left Arm)   Pulse 82   Temp 99.2 F (37.3 C) (Oral)   Resp 16   Ht 5' 5 (1.651 m)   Wt 223 lb 1.7 oz (101.2 kg)   LMP 12/26/2021   SpO2 94%   BMI 37.13 kg/m   Physical Exam Vitals and nursing note reviewed.  Constitutional:      General: She is not in acute distress.    Appearance: Normal appearance. She is well-developed. She is ill-appearing. She is not toxic-appearing.  HENT:     Head: Normocephalic and atraumatic.     Right Ear: Tympanic membrane, ear canal and external ear normal.     Left Ear: Tympanic membrane, ear canal and external ear normal.     Nose: Congestion present.     Mouth/Throat:     Mouth: Mucous membranes are moist.     Pharynx: Oropharynx is clear. Posterior oropharyngeal erythema present.  Eyes:     General: No scleral icterus.       Right eye: No discharge.        Left eye: No discharge.     Conjunctiva/sclera: Conjunctivae normal.  Cardiovascular:     Rate and Rhythm: Normal rate and regular rhythm.     Heart sounds: Normal heart sounds.  Pulmonary:     Effort: Pulmonary effort is normal. No respiratory distress.     Breath sounds: Normal breath sounds.  Musculoskeletal:     Cervical back: Neck supple.  Lymphadenopathy:  Cervical: Cervical adenopathy present.  Skin:    General: Skin is dry.  Neurological:     General: No focal deficit present.     Mental Status: She is alert. Mental status is at baseline.     Motor: No weakness.     Gait: Gait normal.  Psychiatric:        Mood and Affect: Mood normal.        Behavior: Behavior normal.      UC Treatments / Results   Labs (all labs ordered are listed, but only abnormal results are displayed) Labs Reviewed  POCT RAPID STREP A (OFFICE) - Normal  POC SOFIA SARS ANTIGEN FIA - Normal  CULTURE, GROUP A STREP [facility])    EKG   Radiology No results found.  Procedures Procedures (including critical care time)  Medications Ordered in UC Medications - No data to display  Initial Impression / Assessment and Plan / UC Course  I have reviewed the triage vital signs and the nursing notes.  Pertinent labs & imaging results that were available during my care of the patient were reviewed by me and considered in my medical decision making (see chart for details).   53 year old female presents with sore throat, congestion, slight cough and fatigue x 3 days.  No associated fever.  Negative home COVID test.  Negative COVID and strep testing.  Sending strep test for culture.  Will treat if positive.  Likely viral URI.  Supportive care encouraged with increased rest and fluids.  Viscous lidocaine  sent to pharmacy.  Explained she may add liquid Benadryl and Mylanta to it in equal parts.  Reviewed use of OTC meds.  Reviewed return precautions.   Final Clinical Impressions(s) / UC Diagnoses   Final diagnoses:  Sore throat  Pharyngitis, unspecified etiology  Nasal congestion     Discharge Instructions      -Negative COVID and rapid strep -If strep culture is positive we will call and send antibiotics  URI/COLD SYMPTOMS: Your exam today is consistent with a viral illness. Antibiotics are not indicated at this time. Use medications as directed, including cough syrup, nasal saline, and decongestants. Your symptoms should improve over the next few days and resolve within 7-10 days. Increase rest and fluids. F/u if symptoms worsen or predominate such as sore throat, ear pain, productive cough, shortness of breath, or if you develop high fevers or worsening fatigue over the next several days.       ED  Prescriptions     Medication Sig Dispense Auth. Provider   lidocaine  (XYLOCAINE ) 2 % solution Use as directed 15 mLs in the mouth or throat every 3 (three) hours as needed for mouth pain (swish and spit). 100 mL Arvis Jolan NOVAK, PA-C      PDMP not reviewed this encounter.   Arvis Jolan NOVAK, PA-C 08/10/24 [phone]

## 2024-08-12 LAB — CULTURE, GROUP A STREP (THRC)

## 2024-08-13 ENCOUNTER — Ambulatory Visit (HOSPITAL_COMMUNITY): Payer: Self-pay

## 2024-08-27 ENCOUNTER — Ambulatory Visit: Admitting: Dermatology

## 2024-09-26 ENCOUNTER — Ambulatory Visit: Admitting: Dermatology

## 2024-10-03 ENCOUNTER — Encounter: Payer: Self-pay | Admitting: Dermatology

## 2024-10-03 ENCOUNTER — Ambulatory Visit: Admitting: Dermatology

## 2024-10-03 DIAGNOSIS — L720 Epidermal cyst: Secondary | ICD-10-CM | POA: Diagnosis not present

## 2024-10-03 DIAGNOSIS — L281 Prurigo nodularis: Secondary | ICD-10-CM

## 2024-10-03 DIAGNOSIS — L810 Postinflammatory hyperpigmentation: Secondary | ICD-10-CM | POA: Diagnosis not present

## 2024-10-03 DIAGNOSIS — L821 Other seborrheic keratosis: Secondary | ICD-10-CM

## 2024-10-03 DIAGNOSIS — D239 Other benign neoplasm of skin, unspecified: Secondary | ICD-10-CM

## 2024-10-03 DIAGNOSIS — F419 Anxiety disorder, unspecified: Secondary | ICD-10-CM

## 2024-10-03 DIAGNOSIS — D2371 Other benign neoplasm of skin of right lower limb, including hip: Secondary | ICD-10-CM | POA: Diagnosis not present

## 2024-10-03 DIAGNOSIS — L729 Follicular cyst of the skin and subcutaneous tissue, unspecified: Secondary | ICD-10-CM

## 2024-10-03 MED ORDER — MOMETASONE FUROATE 0.1 % EX CREA: 1.0000 | TOPICAL_CREAM | CUTANEOUS | 0 refills | Status: AC

## 2024-10-03 MED ORDER — TRETINOIN 0.025 % EX CREA: TOPICAL_CREAM | Freq: Every day | CUTANEOUS | 0 refills | Status: AC

## 2024-10-03 MED ORDER — MUPIROCIN 2 % EX OINT: 1.0000 | TOPICAL_OINTMENT | Freq: Two times a day (BID) | CUTANEOUS | 0 refills | Status: AC

## 2024-10-03 MED ORDER — ALPRAZOLAM 0.5 MG PO TABS: 0.5000 mg | ORAL_TABLET | Freq: Every day | ORAL | 2 refills | Status: AC

## 2024-10-03 NOTE — Progress Notes (Signed)
 "  Follow-Up Visit   Subjective  Virginia Woods is a 54 y.o. female who presents for the following: Acne ~37m f/u face Arazlo lotion ~3x/wk, maybe some improvement, PIH with hypopigmentation from Hydroquinone cr face ~31m f/u, check spots R lower leg, L ear, picks at spot on L ear   The following portions of the chart were reviewed this encounter and updated as appropriate: medications, allergies, medical history  Review of Systems:  No other skin or systemic complaints except as noted in HPI or Assessment and Plan.  Objective  Well appearing patient in no apparent distress; mood and affect are within normal limits.   A focused examination was performed of the following areas: Face, L ear, R lower leg  Relevant exam findings are noted in the Assessment and Plan.  L cheek    Assessment & Plan   EPIDERMAL INCLUSION CYST Exam: small hyperpigmented firm papules L medial cheek x 2  Benign-appearing. Exam most consistent with an epidermal inclusion cyst. Discussed that a cyst is a benign growth that can grow over time and sometimes get irritated or inflamed.   Treatment: Discussed cont Topical Arazlo lotion increasing to qhs vs course of oral antibiotics vs IL Kenalog  vs I&D vs Punch excision, pt defers procedure at this time Restart Arazlo lotion increasing to qhs   POST-INFLAMMATORY HYPERPIGMENTATION (PIH)  Exam: hyperpigmented firm paps L medial cheek   Chronic and persistent condition with duration or expected duration over one year. Condition is symptomatic/ bothersome to patient. Not currently at goal.    Post-inflammatory hyperpimentation (PIH)  is a benign condition that comes from having previous inflammation in the skin and will fade with time over months to sometimes years. Recommend daily sun protection including sunscreen SPF 30+ to sun-exposed areas. - Recommend treating any itchy or red areas on the skin quickly to prevent new areas of PIH. Once rash has cleared,  treating with prescription medicines such as hydroquinone may help fade dark spots faster.    Treatment Plan:  Restart SM48 Hydroquinone 8% / Tretinoin  0.025% / Kojic Acid 1% / Niacinamide 4% / Fluocinolone 0.025% Cream apply small dot to aa qhs for up to 2 months  Reviewed risk of permanent dark spots (ochronosis) if hydroquinone is used longer than 3 months. Also use on normal skin can lighten its pigmentation  SEBORRHEIC KERATOSIS R lower leg - Stuck-on, waxy, tan-brown papule - Benign-appearing - Discussed benign etiology and prognosis. - Observe - Call for any changes  DERMATOFIBROMA R lower leg Exam: Firm pink/brown papulenodule with dimple sign.  Treatment Plan: A dermatofibroma is a benign growth possibly related to trauma, such as an insect bite, cut from shaving, or inflamed acne-type bump.  Treatment options to remove include shave or excision with resulting scar and risk of recurrence.  Since benign-appearing and not bothersome, will observe for now.    PRURIGO NODULARIS/LICHEN SIMPLEX CHRONICUS L ear Exam: scaly hyperpigmented nodule with excoriation L lower ear antihelix (above lobe)  Chronic and persistent condition with duration or expected duration over one year. Condition is bothersome/symptomatic for patient. Currently flared.  Picking area due to anxiety.   Lichen simplex chronicus [facility]) is a persistent itchy area of thickened skin that is induced by chronic rubbing and/or scratching (chronic dermatitis).  These areas may be pink, hyperpigmented and may have excoriations and bumps (prurigo nodules-PN).  PN/LSC is commonly observed in uncontrolled atopic dermatitis and other forms of eczema, and in other itchy skin conditions (eg, insect bites, scabies).  Sometimes it is not possible to know initial cause of PN/LSC if it has been present for a long time.  It generally responds well to treatment with high potency topical steroids.  It is important to stop  rubbing/scratching the area in order to break the itch-scratch-rash-itch cycle, in order for the rash to resolve.   Treatment Plan: Start Mometasone  cr bid to aa L ear up to 1 month Start Mupirocin  oint bid x 1 wk Avoid picking/rubbing/scratching   ANXIETY   This Visit - ALPRAZolam  (XANAX ) 0.5 MG tablet - Take 1 tablet (0.5 mg total) by mouth at bedtime. qhs to left cheek  Return in about 6 months (around 04/02/2025) for Cyst f/u, PIH f/u, .  I, [name], RMA, am acting as scribe for Rexene Rattler, MD .   Documentation: I have reviewed the above documentation for accuracy and completeness, and I agree with the above.  Rexene Rattler, MD    "

## 2024-10-03 NOTE — Patient Instructions (Signed)
 Instructions for [facility] Medications  One or more of your medications was sent to the [facility] mail order compounding pharmacy. You will receive an email from them and can purchase the medicine through that link. It will then be mailed to your home at the address you confirmed. If for any reason you do not receive an email from them, please check your spam folder. If you still do not find the email, please let us  know. [facility] phone number is [phone].   Due to recent changes in healthcare laws, you may see results of your pathology and/or laboratory studies on MyChart before the doctors have had a chance to review them. We understand that in some cases there may be results that are confusing or concerning to you. Please understand that not all results are received at the same time and often the doctors may need to interpret multiple results in order to provide you with the best plan of care or course of treatment. Therefore, we ask that you please give us  2 business days to thoroughly review all your results before contacting the office for clarification. Should we see a critical lab result, you will be contacted sooner.   If You Need Anything After Your Visit  If you have any questions or concerns for your doctor, please call our main line at [phone] and press option 4 to reach your doctor's medical assistant. If no one answers, please leave a voicemail as directed and we will return your call as soon as possible. Messages left after 4 pm will be answered the following business day.   You may also send us  a message via MyChart. We typically respond to MyChart messages within 1-2 business days.  For prescription refills, please ask your pharmacy to contact our office. Our fax number is [phone].  If you have an urgent issue when the clinic is closed that cannot wait until the next business day, you can page your doctor at the number below.    Please note that  while we do our best to be available for urgent issues outside of office hours, we are not available 24/7.   If you have an urgent issue and are unable to reach us , you may choose to seek medical care at your doctor's office, retail clinic, urgent care center, or emergency room.  If you have a medical emergency, please immediately call 911 or go to the emergency department.  Pager Numbers  - Dr. Hester: [phone]  - Dr. Jackquline: [phone]  - Dr. Claudene: [phone]   - Dr. Raymund: [phone]  In the event of inclement weather, please call our main line at [phone] for an update on the status of any delays or closures.  Dermatology Medication Tips: Please keep the boxes that topical medications come in in order to help keep track of the instructions about where and how to use these. Pharmacies typically print the medication instructions only on the boxes and not directly on the medication tubes.   If your medication is too expensive, please contact our office at [phone] option 4 or send us  a message through MyChart.   We are unable to tell what your co-pay for medications will be in advance as this is different depending on your insurance coverage. However, we may be able to find a substitute medication at lower cost or fill out paperwork to get insurance to cover a needed medication.   If a prior authorization is required to get your medication covered by  your insurance company, please allow us  1-2 business days to complete this process.  Drug prices often vary depending on where the prescription is filled and some pharmacies may offer cheaper prices.  The website www.goodrx.com contains coupons for medications through different pharmacies. The prices here do not account for what the cost may be with help from insurance (it may be cheaper with your insurance), but the website can give you the price if you did not use any insurance.  - You can print the associated  coupon and take it with your prescription to the pharmacy.  - You may also stop by our office during regular business hours and pick up a GoodRx coupon card.  - If you need your prescription sent electronically to a different pharmacy, notify our office through [facility] or by phone at [phone] option 4.     Si Usted Necesita Algo Despus de Su Visita  Tambin puede enviarnos un mensaje a travs de Clinical cytogeneticist. Por lo general respondemos a los mensajes de MyChart en el transcurso de 1 a 2 das hbiles.  Para renovar recetas, por favor pida a su farmacia que se ponga en contacto con nuestra oficina. Randi lakes de fax es [place] [phone].  Si tiene un asunto urgente cuando la clnica est cerrada y que no puede esperar hasta el siguiente da hbil, puede llamar/localizar a su doctor(a) al nmero que aparece a continuacin.   Por favor, tenga en cuenta que aunque hacemos todo lo posible para estar disponibles para asuntos urgentes fuera del horario de [place], no estamos disponibles las 24 horas del da, los 7 [address]  [address] de la [place].   Si tiene un problema urgente y no puede comunicarse con nosotros, puede optar por buscar atencin mdica  en el consultorio de su doctor(a), en una clnica privada, en un centro de atencin urgente o en una sala de emergencias.  Si tiene Engineer, drilling, por favor llame inmediatamente al 911 o vaya a la sala de emergencias.  Nmeros de bper  - Dr. Hester: [phone]  - Dra. Jackquline: [phone]  - Dr. Claudene: [phone]  - Dra. Kitts: [phone]  En caso de inclemencias del [place], por favor llame a nuestra lnea principal al [phone] para una actualizacin sobre el estado de cualquier retraso o cierre.  Consejos para la medicacin en dermatologa: Por favor, guarde las cajas en las que vienen los medicamentos de uso tpico para ayudarle a seguir las instrucciones sobre dnde y cmo usarlos. Las farmacias generalmente imprimen  las instrucciones del medicamento slo en las cajas y no directamente en los tubos del [place].   Si su medicamento es muy caro, por favor, pngase en contacto con landry rieger llamando al [phone] y presione la opcin 4 o envenos un mensaje a travs de Clinical cytogeneticist.   No podemos decirle cul ser su copago por los medicamentos por adelantado ya que esto es diferente dependiendo de la cobertura de su seguro. Sin embargo, es posible que podamos encontrar un medicamento sustituto a Audiological scientist un formulario para que el seguro cubra el medicamento que se considera necesario.   Si se requiere una autorizacin previa para que su compaa de seguros malta su medicamento, por favor permtanos de 1 a 2 das hbiles para completar este proceso.  Los precios de los medicamentos varan con frecuencia dependiendo del Environmental consultant de dnde se surte la receta y alguna farmacias pueden ofrecer precios ms baratos.  El sitio web www.goodrx.com tiene cupones para medicamentos de Health and safety inspector.  Los precios aqu no tienen en cuenta lo que podra costar con la ayuda del seguro (puede ser ms barato con su seguro), pero el sitio web puede darle el precio si no utiliz Tourist information centre manager.  - Puede imprimir el cupn correspondiente y llevarlo con su receta a la farmacia.  - Tambin puede pasar por nuestra oficina durante el horario de atencin regular y Education officer, museum una tarjeta de cupones de GoodRx.  - Si necesita que su receta se enve electrnicamente a una farmacia diferente, informe a nuestra oficina a travs de MyChart de  o por telfono llamando al [phone] y presione la opcin 4.

## 2025-04-08 ENCOUNTER — Ambulatory Visit: Admitting: Dermatology

## 2025-07-05 ENCOUNTER — Inpatient Hospital Stay

## 2025-07-08 ENCOUNTER — Inpatient Hospital Stay: Admitting: Hematology and Oncology
